# Patient Record
Sex: Female | Born: 1935 | Race: White | Hispanic: No | Smoking: Former smoker
Health system: Southern US, Community
[De-identification: ages and names within clinical notes are randomized; demographics above are authoritative.]

## PROBLEM LIST (undated history)

## (undated) DIAGNOSIS — H409 Unspecified glaucoma: Secondary | ICD-10-CM

## (undated) DIAGNOSIS — D3500 Benign neoplasm of unspecified adrenal gland: Secondary | ICD-10-CM

## (undated) DIAGNOSIS — I495 Sick sinus syndrome: Secondary | ICD-10-CM

## (undated) DIAGNOSIS — R51 Headache: Secondary | ICD-10-CM

## (undated) DIAGNOSIS — E042 Nontoxic multinodular goiter: Secondary | ICD-10-CM

## (undated) DIAGNOSIS — C3490 Malignant neoplasm of unspecified part of unspecified bronchus or lung: Secondary | ICD-10-CM

## (undated) DIAGNOSIS — K219 Gastro-esophageal reflux disease without esophagitis: Secondary | ICD-10-CM

## (undated) DIAGNOSIS — M810 Age-related osteoporosis without current pathological fracture: Secondary | ICD-10-CM

## (undated) DIAGNOSIS — K5792 Diverticulitis of intestine, part unspecified, without perforation or abscess without bleeding: Secondary | ICD-10-CM

## (undated) DIAGNOSIS — Z79899 Other long term (current) drug therapy: Secondary | ICD-10-CM

## (undated) DIAGNOSIS — E785 Hyperlipidemia, unspecified: Secondary | ICD-10-CM

## (undated) DIAGNOSIS — I10 Essential (primary) hypertension: Secondary | ICD-10-CM

## (undated) DIAGNOSIS — M5430 Sciatica, unspecified side: Secondary | ICD-10-CM

## (undated) DIAGNOSIS — A319 Mycobacterial infection, unspecified: Secondary | ICD-10-CM

## (undated) DIAGNOSIS — F419 Anxiety disorder, unspecified: Secondary | ICD-10-CM

## (undated) DIAGNOSIS — G8929 Other chronic pain: Secondary | ICD-10-CM

## (undated) DIAGNOSIS — H04123 Dry eye syndrome of bilateral lacrimal glands: Secondary | ICD-10-CM

## (undated) DIAGNOSIS — E0590 Thyrotoxicosis, unspecified without thyrotoxic crisis or storm: Secondary | ICD-10-CM

## (undated) DIAGNOSIS — I4821 Permanent atrial fibrillation: Secondary | ICD-10-CM

## (undated) DIAGNOSIS — K5720 Diverticulitis of large intestine with perforation and abscess without bleeding: Secondary | ICD-10-CM

## (undated) DIAGNOSIS — Z8719 Personal history of other diseases of the digestive system: Secondary | ICD-10-CM

## (undated) DIAGNOSIS — Z87448 Personal history of other diseases of urinary system: Secondary | ICD-10-CM

## (undated) HISTORY — DX: Thyrotoxicosis, unspecified without thyrotoxic crisis or storm: E05.90

## (undated) HISTORY — DX: Other long term (current) drug therapy: Z79.899

## (undated) HISTORY — DX: Dry eye syndrome of bilateral lacrimal glands: H04.123

## (undated) HISTORY — DX: Mycobacterial infection, unspecified: A31.9

## (undated) HISTORY — DX: Benign neoplasm of unspecified adrenal gland: D35.00

## (undated) HISTORY — PX: CATARACT EXTRACTION, BILATERAL: SHX1313

## (undated) HISTORY — DX: Personal history of other diseases of urinary system: Z87.448

## (undated) HISTORY — DX: Other chronic pain: G89.29

## (undated) HISTORY — DX: Permanent atrial fibrillation: I48.21

## (undated) HISTORY — DX: Sick sinus syndrome: I49.5

## (undated) HISTORY — DX: Diverticulitis of intestine, part unspecified, without perforation or abscess without bleeding: K57.92

## (undated) HISTORY — DX: Hyperlipidemia, unspecified: E78.5

## (undated) HISTORY — DX: Gastro-esophageal reflux disease without esophagitis: K21.9

## (undated) HISTORY — DX: Personal history of other diseases of the digestive system: Z87.19

## (undated) HISTORY — DX: Essential (primary) hypertension: I10

## (undated) HISTORY — DX: Headache: R51

## (undated) HISTORY — DX: Nontoxic multinodular goiter: E04.2

## (undated) HISTORY — DX: Sciatica, unspecified side: M54.30

## (undated) HISTORY — DX: Age-related osteoporosis without current pathological fracture: M81.0

## (undated) HISTORY — DX: Unspecified glaucoma: H40.9

## (undated) HISTORY — DX: Malignant neoplasm of unspecified part of unspecified bronchus or lung: C34.90

## (undated) HISTORY — DX: Anxiety disorder, unspecified: F41.9

---

## 1980-09-16 HISTORY — PX: BUNIONECTOMY: SHX129

## 1986-09-16 HISTORY — PX: HERNIA REPAIR: SHX51

## 1986-11-15 HISTORY — PX: LAMINECTOMY: SHX219

## 2003-12-14 ENCOUNTER — Encounter: Admission: RE | Admit: 2003-12-14 | Discharge: 2003-12-14 | Payer: Self-pay | Admitting: Internal Medicine

## 2005-02-15 ENCOUNTER — Ambulatory Visit (HOSPITAL_COMMUNITY): Admission: RE | Admit: 2005-02-15 | Discharge: 2005-02-15 | Payer: Self-pay | Admitting: Thoracic Surgery

## 2005-05-08 ENCOUNTER — Encounter: Admission: RE | Admit: 2005-05-08 | Discharge: 2005-05-08 | Payer: Self-pay | Admitting: Thoracic Surgery

## 2005-09-16 DIAGNOSIS — C3490 Malignant neoplasm of unspecified part of unspecified bronchus or lung: Secondary | ICD-10-CM

## 2005-09-16 HISTORY — DX: Malignant neoplasm of unspecified part of unspecified bronchus or lung: C34.90

## 2005-11-12 ENCOUNTER — Encounter: Admission: RE | Admit: 2005-11-12 | Discharge: 2005-11-12 | Payer: Self-pay | Admitting: Thoracic Surgery

## 2006-03-05 ENCOUNTER — Encounter: Admission: RE | Admit: 2006-03-05 | Discharge: 2006-03-05 | Payer: Self-pay | Admitting: Thoracic Surgery

## 2006-03-11 ENCOUNTER — Ambulatory Visit (HOSPITAL_COMMUNITY): Admission: RE | Admit: 2006-03-11 | Discharge: 2006-03-11 | Payer: Self-pay | Admitting: Thoracic Surgery

## 2006-03-16 HISTORY — PX: WEDGE RESECTION: SHX5070

## 2006-03-17 ENCOUNTER — Ambulatory Visit (HOSPITAL_COMMUNITY): Admission: RE | Admit: 2006-03-17 | Discharge: 2006-03-17 | Payer: Self-pay | Admitting: Thoracic Surgery

## 2006-03-17 ENCOUNTER — Encounter (INDEPENDENT_AMBULATORY_CARE_PROVIDER_SITE_OTHER): Payer: Self-pay | Admitting: Specialist

## 2006-03-20 ENCOUNTER — Ambulatory Visit: Admission: RE | Admit: 2006-03-20 | Discharge: 2006-06-08 | Payer: Self-pay | Admitting: Radiation Oncology

## 2006-04-01 ENCOUNTER — Encounter: Admission: RE | Admit: 2006-04-01 | Discharge: 2006-04-01 | Payer: Self-pay | Admitting: Radiation Oncology

## 2006-04-08 ENCOUNTER — Encounter (INDEPENDENT_AMBULATORY_CARE_PROVIDER_SITE_OTHER): Payer: Self-pay | Admitting: *Deleted

## 2006-04-08 ENCOUNTER — Inpatient Hospital Stay (HOSPITAL_COMMUNITY): Admission: RE | Admit: 2006-04-08 | Discharge: 2006-04-14 | Payer: Self-pay | Admitting: Thoracic Surgery

## 2006-04-16 ENCOUNTER — Encounter: Admission: RE | Admit: 2006-04-16 | Discharge: 2006-04-16 | Payer: Self-pay | Admitting: Thoracic Surgery

## 2006-04-30 ENCOUNTER — Encounter: Admission: RE | Admit: 2006-04-30 | Discharge: 2006-04-30 | Payer: Self-pay | Admitting: Thoracic Surgery

## 2006-05-07 ENCOUNTER — Encounter (HOSPITAL_COMMUNITY): Admission: RE | Admit: 2006-05-07 | Discharge: 2006-05-28 | Payer: Self-pay | Admitting: Internal Medicine

## 2006-05-14 ENCOUNTER — Encounter: Admission: RE | Admit: 2006-05-14 | Discharge: 2006-05-14 | Payer: Self-pay | Admitting: Thoracic Surgery

## 2006-05-16 ENCOUNTER — Ambulatory Visit: Payer: Self-pay | Admitting: Internal Medicine

## 2006-05-20 LAB — COMPREHENSIVE METABOLIC PANEL
Alkaline Phosphatase: 51 U/L (ref 39–117)
BUN: 19 mg/dL (ref 6–23)
Creatinine, Ser: 0.72 mg/dL (ref 0.40–1.20)
Glucose, Bld: 85 mg/dL (ref 70–99)
Total Bilirubin: 0.4 mg/dL (ref 0.3–1.2)

## 2006-05-20 LAB — CBC WITH DIFFERENTIAL/PLATELET
Basophils Absolute: 0 10*3/uL (ref 0.0–0.1)
EOS%: 3.2 % (ref 0.0–7.0)
HGB: 12.9 g/dL (ref 11.6–15.9)
MCH: 30.4 pg (ref 26.0–34.0)
MCV: 89.4 fL (ref 81.0–101.0)
MONO%: 10.8 % (ref 0.0–13.0)
NEUT#: 4.4 10*3/uL (ref 1.5–6.5)
RBC: 4.23 10*6/uL (ref 3.70–5.32)
RDW: 13.6 % (ref 11.3–14.5)
lymph#: 1 10*3/uL (ref 0.9–3.3)

## 2006-06-03 ENCOUNTER — Encounter: Admission: RE | Admit: 2006-06-03 | Discharge: 2006-06-03 | Payer: Self-pay | Admitting: Thoracic Surgery

## 2006-06-04 ENCOUNTER — Other Ambulatory Visit: Admission: RE | Admit: 2006-06-04 | Discharge: 2006-06-04 | Payer: Self-pay | Admitting: Diagnostic Radiology

## 2006-06-04 ENCOUNTER — Encounter (INDEPENDENT_AMBULATORY_CARE_PROVIDER_SITE_OTHER): Payer: Self-pay | Admitting: *Deleted

## 2006-06-04 ENCOUNTER — Encounter: Admission: RE | Admit: 2006-06-04 | Discharge: 2006-06-04 | Payer: Self-pay | Admitting: Internal Medicine

## 2006-08-27 ENCOUNTER — Encounter: Admission: RE | Admit: 2006-08-27 | Discharge: 2006-08-27 | Payer: Self-pay | Admitting: Thoracic Surgery

## 2006-09-16 DIAGNOSIS — I495 Sick sinus syndrome: Secondary | ICD-10-CM

## 2006-09-16 HISTORY — DX: Sick sinus syndrome: I49.5

## 2006-11-06 ENCOUNTER — Ambulatory Visit: Payer: Self-pay | Admitting: Internal Medicine

## 2006-11-11 LAB — CBC WITH DIFFERENTIAL/PLATELET
Basophils Absolute: 0 10*3/uL (ref 0.0–0.1)
Eosinophils Absolute: 0.1 10*3/uL (ref 0.0–0.5)
HGB: 14.3 g/dL (ref 11.6–15.9)
LYMPH%: 17.2 % (ref 14.0–48.0)
MCV: 85.5 fL (ref 81.0–101.0)
MONO#: 0.6 10*3/uL (ref 0.1–0.9)
MONO%: 8.7 % (ref 0.0–13.0)
NEUT#: 5.3 10*3/uL (ref 1.5–6.5)
Platelets: 441 10*3/uL — ABNORMAL HIGH (ref 145–400)
WBC: 7.3 10*3/uL (ref 3.9–10.0)

## 2006-11-11 LAB — COMPREHENSIVE METABOLIC PANEL
Albumin: 4.2 g/dL (ref 3.5–5.2)
Alkaline Phosphatase: 55 U/L (ref 39–117)
BUN: 12 mg/dL (ref 6–23)
CO2: 28 mEq/L (ref 19–32)
Glucose, Bld: 105 mg/dL — ABNORMAL HIGH (ref 70–99)
Potassium: 3.7 mEq/L (ref 3.5–5.3)
Total Bilirubin: 0.5 mg/dL (ref 0.3–1.2)

## 2006-11-13 ENCOUNTER — Ambulatory Visit (HOSPITAL_COMMUNITY): Admission: RE | Admit: 2006-11-13 | Discharge: 2006-11-13 | Payer: Self-pay | Admitting: Internal Medicine

## 2006-12-24 ENCOUNTER — Encounter: Admission: RE | Admit: 2006-12-24 | Discharge: 2006-12-24 | Payer: Self-pay | Admitting: Thoracic Surgery

## 2006-12-24 ENCOUNTER — Ambulatory Visit: Payer: Self-pay | Admitting: Thoracic Surgery

## 2007-01-15 HISTORY — PX: PACEMAKER INSERTION: SHX728

## 2007-01-17 ENCOUNTER — Inpatient Hospital Stay (HOSPITAL_COMMUNITY): Admission: AD | Admit: 2007-01-17 | Discharge: 2007-01-21 | Payer: Self-pay | Admitting: Cardiology

## 2007-01-17 ENCOUNTER — Encounter: Payer: Self-pay | Admitting: Cardiology

## 2007-01-18 ENCOUNTER — Encounter (INDEPENDENT_AMBULATORY_CARE_PROVIDER_SITE_OTHER): Payer: Self-pay | Admitting: Cardiology

## 2007-04-21 ENCOUNTER — Ambulatory Visit: Payer: Self-pay | Admitting: Internal Medicine

## 2007-04-21 LAB — COMPREHENSIVE METABOLIC PANEL
Albumin: 3.8 g/dL (ref 3.5–5.2)
BUN: 13 mg/dL (ref 6–23)
CO2: 29 mEq/L (ref 19–32)
Calcium: 9.2 mg/dL (ref 8.4–10.5)
Chloride: 97 mEq/L (ref 96–112)
Glucose, Bld: 100 mg/dL — ABNORMAL HIGH (ref 70–99)
Potassium: 3.4 mEq/L — ABNORMAL LOW (ref 3.5–5.3)
Sodium: 138 mEq/L (ref 135–145)
Total Protein: 7 g/dL (ref 6.0–8.3)

## 2007-04-21 LAB — CBC WITH DIFFERENTIAL/PLATELET
Basophils Absolute: 0 10*3/uL (ref 0.0–0.1)
Eosinophils Absolute: 0.1 10*3/uL (ref 0.0–0.5)
HGB: 13.1 g/dL (ref 11.6–15.9)
MCV: 84.5 fL (ref 81.0–101.0)
MONO#: 0.9 10*3/uL (ref 0.1–0.9)
MONO%: 10.8 % (ref 0.0–13.0)
NEUT#: 6 10*3/uL (ref 1.5–6.5)
RBC: 4.5 10*6/uL (ref 3.70–5.32)
RDW: 14.8 % — ABNORMAL HIGH (ref 11.3–14.5)
WBC: 8.1 10*3/uL (ref 3.9–10.0)

## 2007-04-22 ENCOUNTER — Ambulatory Visit (HOSPITAL_COMMUNITY): Admission: RE | Admit: 2007-04-22 | Discharge: 2007-04-22 | Payer: Self-pay | Admitting: Internal Medicine

## 2007-04-29 ENCOUNTER — Encounter: Payer: Self-pay | Admitting: Pulmonary Disease

## 2007-05-06 ENCOUNTER — Ambulatory Visit: Payer: Self-pay | Admitting: Pulmonary Disease

## 2007-05-13 ENCOUNTER — Ambulatory Visit: Payer: Self-pay | Admitting: Pulmonary Disease

## 2007-05-13 ENCOUNTER — Ambulatory Visit: Admission: RE | Admit: 2007-05-13 | Discharge: 2007-05-13 | Payer: Self-pay | Admitting: Pulmonary Disease

## 2007-05-14 ENCOUNTER — Encounter: Payer: Self-pay | Admitting: Pulmonary Disease

## 2007-05-20 ENCOUNTER — Ambulatory Visit: Payer: Self-pay | Admitting: Pulmonary Disease

## 2007-05-21 DIAGNOSIS — I4891 Unspecified atrial fibrillation: Secondary | ICD-10-CM

## 2007-05-21 DIAGNOSIS — J479 Bronchiectasis, uncomplicated: Secondary | ICD-10-CM

## 2007-05-21 DIAGNOSIS — C3490 Malignant neoplasm of unspecified part of unspecified bronchus or lung: Secondary | ICD-10-CM | POA: Insufficient documentation

## 2007-06-10 ENCOUNTER — Ambulatory Visit: Payer: Self-pay | Admitting: Pulmonary Disease

## 2007-06-18 ENCOUNTER — Ambulatory Visit: Payer: Self-pay | Admitting: Internal Medicine

## 2007-06-23 ENCOUNTER — Ambulatory Visit (HOSPITAL_COMMUNITY): Admission: RE | Admit: 2007-06-23 | Discharge: 2007-06-23 | Payer: Self-pay | Admitting: Internal Medicine

## 2007-06-23 LAB — COMPREHENSIVE METABOLIC PANEL
ALT: 67 U/L — ABNORMAL HIGH (ref 0–35)
Alkaline Phosphatase: 57 U/L (ref 39–117)
CO2: 33 mEq/L — ABNORMAL HIGH (ref 19–32)
Creatinine, Ser: 0.58 mg/dL (ref 0.40–1.20)
Sodium: 137 mEq/L (ref 135–145)
Total Bilirubin: 0.3 mg/dL (ref 0.3–1.2)
Total Protein: 6.9 g/dL (ref 6.0–8.3)

## 2007-06-23 LAB — CBC WITH DIFFERENTIAL/PLATELET
BASO%: 0.6 % (ref 0.0–2.0)
EOS%: 2 % (ref 0.0–7.0)
HCT: 36.2 % (ref 34.8–46.6)
LYMPH%: 17.6 % (ref 14.0–48.0)
MCH: 28.3 pg (ref 26.0–34.0)
MCHC: 34.5 g/dL (ref 32.0–36.0)
MCV: 81.8 fL (ref 81.0–101.0)
MONO%: 17.4 % — ABNORMAL HIGH (ref 0.0–13.0)
NEUT%: 62.4 % (ref 39.6–76.8)
Platelets: 449 10*3/uL — ABNORMAL HIGH (ref 145–400)
RBC: 4.43 10*6/uL (ref 3.70–5.32)
WBC: 5.9 10*3/uL (ref 3.9–10.0)

## 2007-06-24 ENCOUNTER — Ambulatory Visit: Payer: Self-pay | Admitting: Thoracic Surgery

## 2007-07-08 ENCOUNTER — Ambulatory Visit: Payer: Self-pay | Admitting: Pulmonary Disease

## 2007-07-08 LAB — CONVERTED CEMR LAB
ALT: 100 units/L — ABNORMAL HIGH (ref 0–35)
Alkaline Phosphatase: 55 units/L (ref 39–117)
Bilirubin, Direct: 0.1 mg/dL (ref 0.0–0.3)
Total Bilirubin: 0.5 mg/dL (ref 0.3–1.2)
Total Protein: 7.4 g/dL (ref 6.0–8.3)

## 2007-08-03 ENCOUNTER — Ambulatory Visit: Payer: Self-pay | Admitting: Pulmonary Disease

## 2007-08-03 LAB — CONVERTED CEMR LAB
Bilirubin, Direct: 0.1 mg/dL (ref 0.0–0.3)
Total Protein: 7.5 g/dL (ref 6.0–8.3)

## 2007-08-24 ENCOUNTER — Encounter: Admission: RE | Admit: 2007-08-24 | Discharge: 2007-08-24 | Payer: Self-pay | Admitting: Internal Medicine

## 2007-09-08 ENCOUNTER — Ambulatory Visit: Admission: RE | Admit: 2007-09-08 | Discharge: 2007-09-14 | Payer: Self-pay | Admitting: Radiation Oncology

## 2007-09-18 ENCOUNTER — Ambulatory Visit: Payer: Self-pay | Admitting: Pulmonary Disease

## 2007-09-20 DIAGNOSIS — A310 Pulmonary mycobacterial infection: Secondary | ICD-10-CM | POA: Insufficient documentation

## 2007-12-17 ENCOUNTER — Ambulatory Visit: Payer: Self-pay | Admitting: Internal Medicine

## 2007-12-22 ENCOUNTER — Ambulatory Visit (HOSPITAL_COMMUNITY): Admission: RE | Admit: 2007-12-22 | Discharge: 2007-12-22 | Payer: Self-pay | Admitting: Internal Medicine

## 2007-12-22 LAB — CBC WITH DIFFERENTIAL/PLATELET
Basophils Absolute: 0.1 10*3/uL (ref 0.0–0.1)
Eosinophils Absolute: 0.1 10*3/uL (ref 0.0–0.5)
HGB: 13.2 g/dL (ref 11.6–15.9)
MONO#: 0.5 10*3/uL (ref 0.1–0.9)
NEUT#: 3.5 10*3/uL (ref 1.5–6.5)
RBC: 4.77 10*6/uL (ref 3.70–5.32)
RDW: 14.3 % (ref 11.3–14.5)
WBC: 5.3 10*3/uL (ref 3.9–10.0)
lymph#: 1.1 10*3/uL (ref 0.9–3.3)

## 2007-12-22 LAB — COMPREHENSIVE METABOLIC PANEL
AST: 27 U/L (ref 0–37)
Albumin: 4 g/dL (ref 3.5–5.2)
Alkaline Phosphatase: 44 U/L (ref 39–117)
BUN: 8 mg/dL (ref 6–23)
Calcium: 9 mg/dL (ref 8.4–10.5)
Chloride: 97 mEq/L (ref 96–112)
Glucose, Bld: 117 mg/dL — ABNORMAL HIGH (ref 70–99)
Potassium: 3.1 mEq/L — ABNORMAL LOW (ref 3.5–5.3)
Sodium: 133 mEq/L — ABNORMAL LOW (ref 135–145)
Total Protein: 7.7 g/dL (ref 6.0–8.3)

## 2007-12-23 ENCOUNTER — Ambulatory Visit: Payer: Self-pay | Admitting: Pulmonary Disease

## 2007-12-23 ENCOUNTER — Ambulatory Visit: Payer: Self-pay | Admitting: Thoracic Surgery

## 2007-12-23 DIAGNOSIS — R079 Chest pain, unspecified: Secondary | ICD-10-CM | POA: Insufficient documentation

## 2008-01-23 IMAGING — CT CT BIOPSY
1 series · 15 of 33 positions shown, 19 images · non-contrast
Comparison: Prior CT and PET studies.

CLINICAL DATA: Enlarging right lower lobe lung nodule.  The patient presents for CT guided biopsy procedure.
 CT GUIDED NEEDLE ASPIRATE BIOPSY OF RIGHT LOWER LOBE LUNG MASS:

[Series 2: routine chest · axial · 0.70mm/px · z∈[-319,-194]mm · 15 of 129 slices shown, 19 images]
[im 10/129  mediastinal]
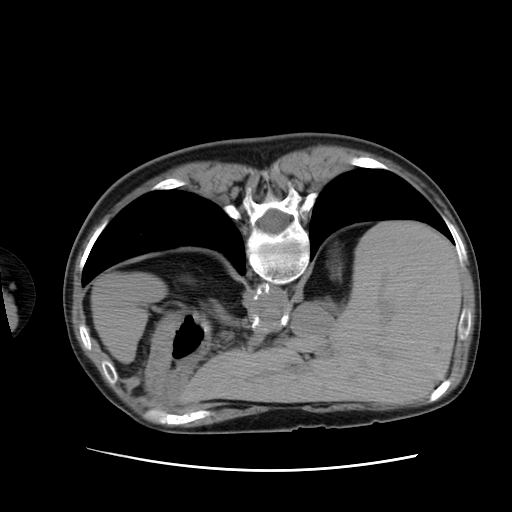
[im 10/129  lung]
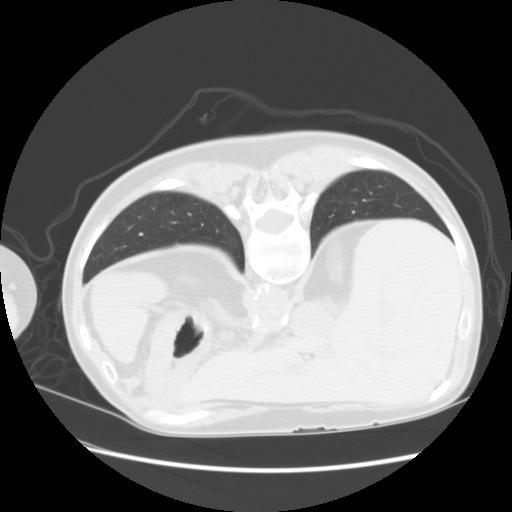
[im 19/129  lung]
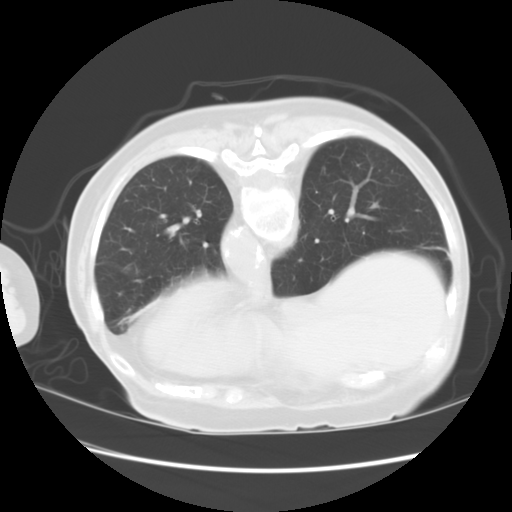
[im 24/129  lung]
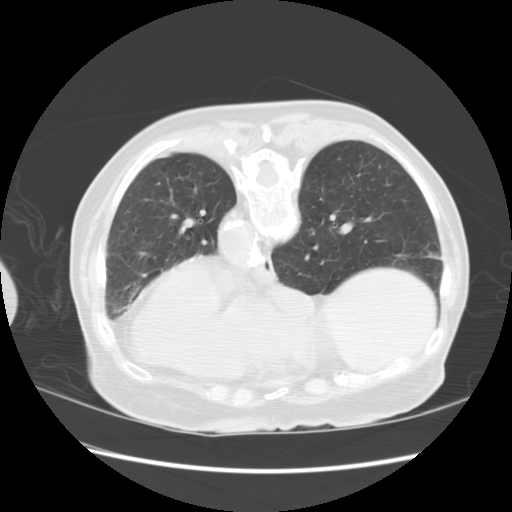
[im 29/129  lung]
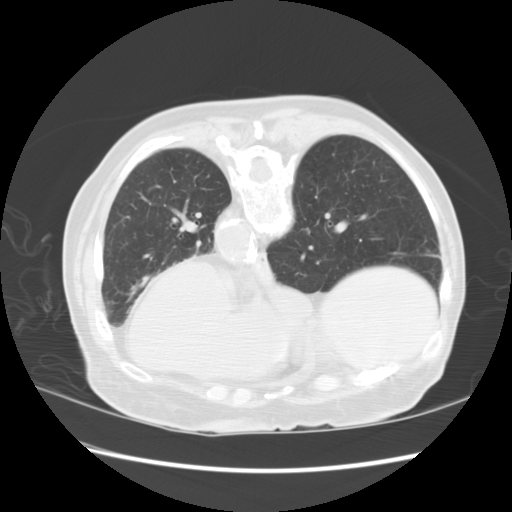
[im 38/129  mediastinal]
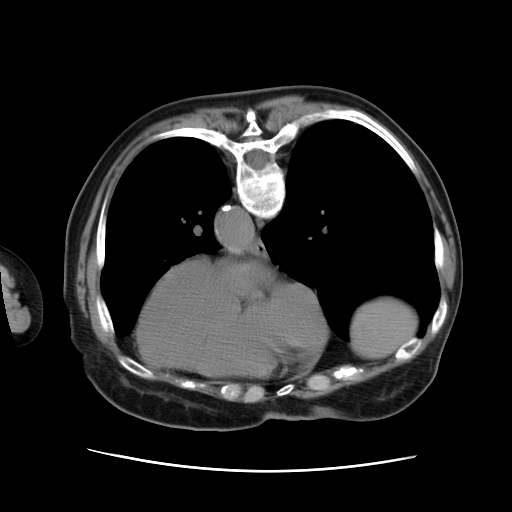
[im 38/129  lung]
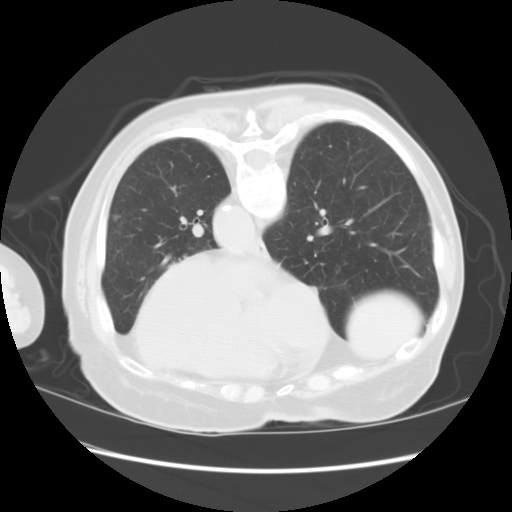
[im 48/129  lung]
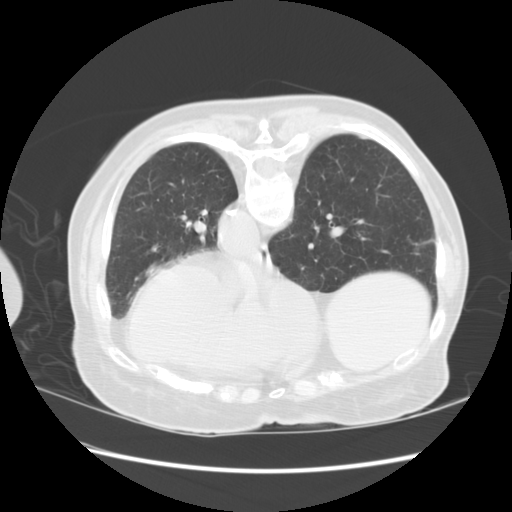
[im 53/129  lung]
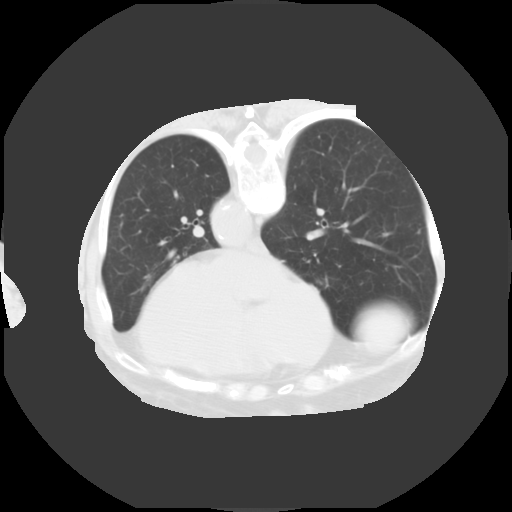
[im 62/129  lung]
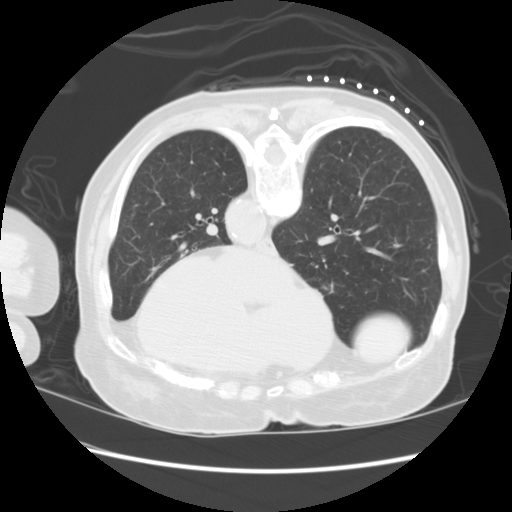
[im 72/129  mediastinal]
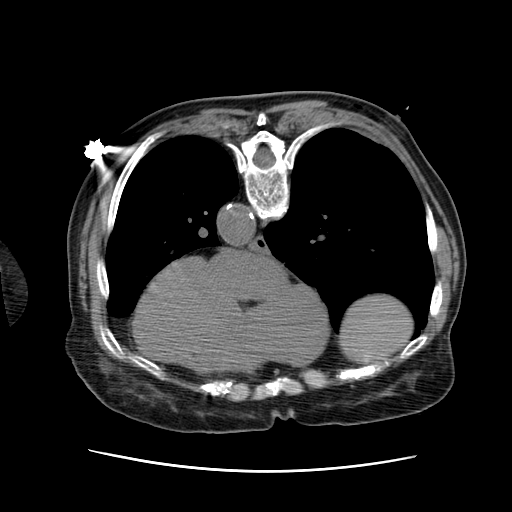
[im 72/129  lung]
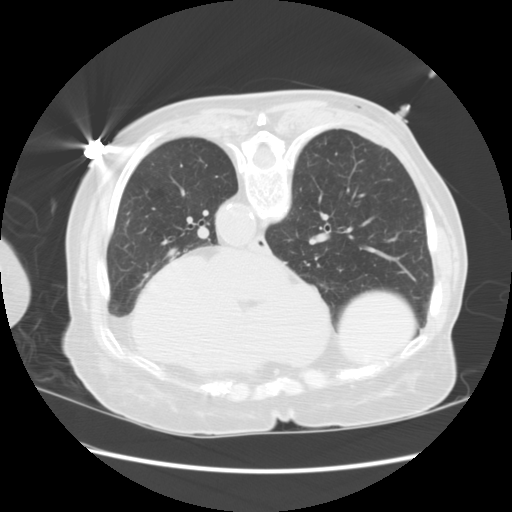
[im 77/129  lung]
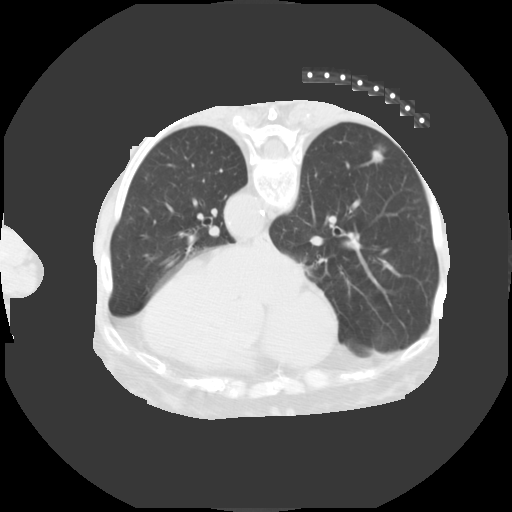
[im 86/129  lung]
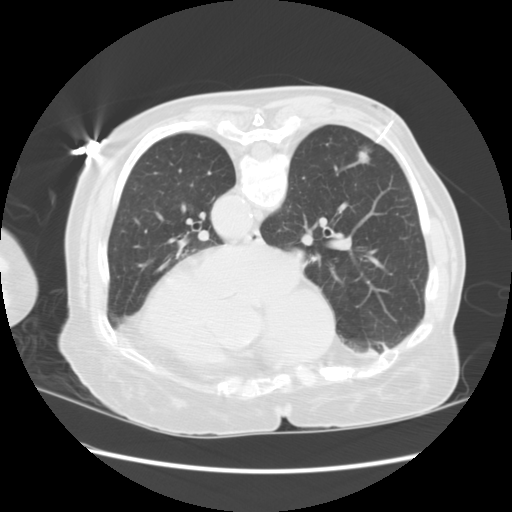
[im 95/129  lung]
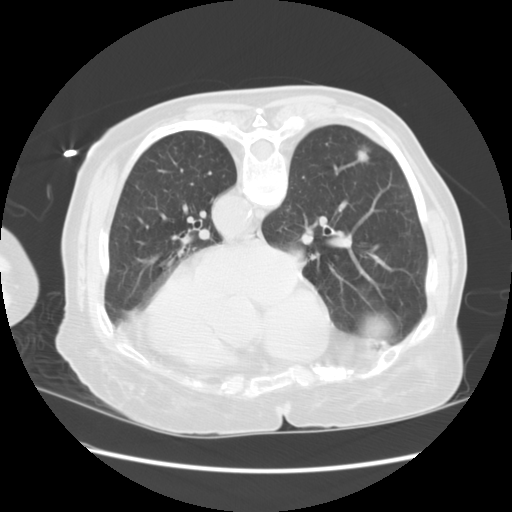
[im 103/129  mediastinal]
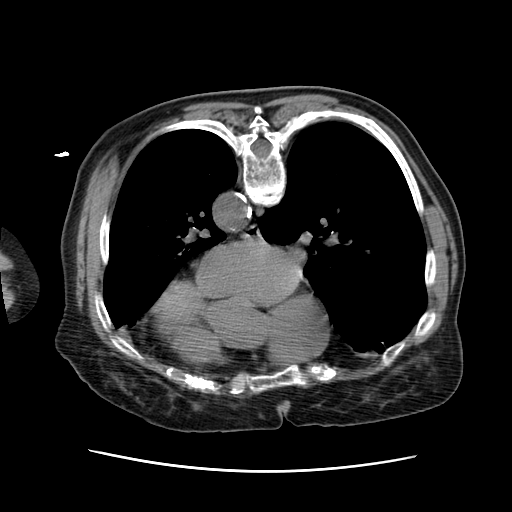
[im 103/129  lung]
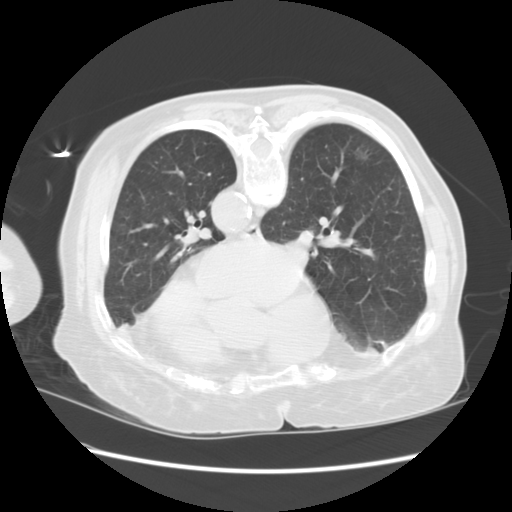
[im 110/129  lung]
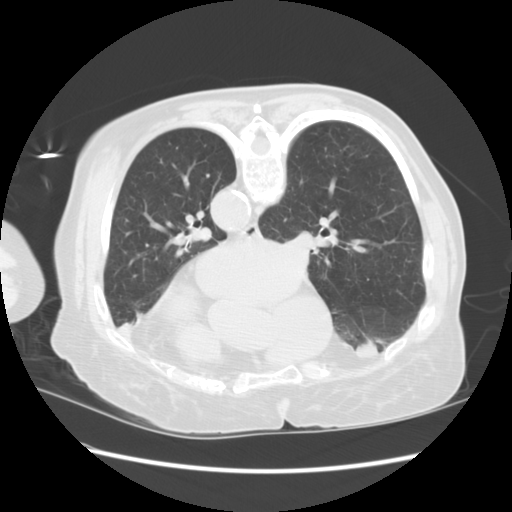
[im 119/129  lung]
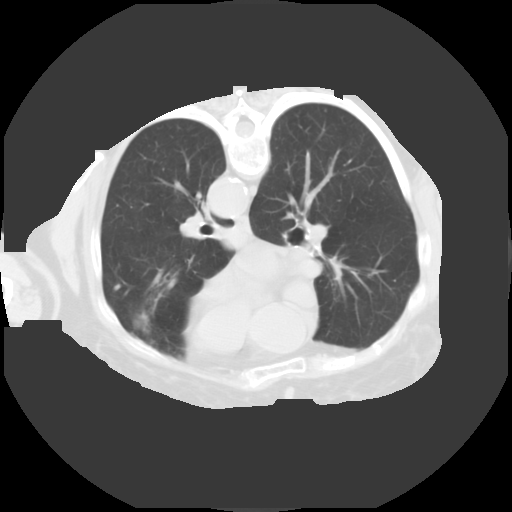

[15 of 33 positions shown; findings below may reference images not displayed]

Prior to the procedure, informed consent was obtained.  
 Sedation:  1.5 mg IV Versed, 75 mCg IV fentanyl.
 Total moderate sedation time:  30 minutes.
FINDINGS: The patient was placed in a prone position and unenhanced CT performed through the lower lung zones.  After localizing a site along the posterior right chest wall, the skin was sterilely prepped and draped.  Local anesthesia was provided with 1% Lidocaine.  
 Under CT guidance, a 19-gauge trocar needle was advanced to the level of a lower lobe pulmonary nodule.  After confirming needle tip position, coaxial needle aspirates were performed with 22-gauge needles.  Initially, 3 needle aspirates were performed with 2 placed on slides for cytologic analysis including a Quick stain and a third placed in solution for cell block purposes.
 After Quick stain analysis, a fourth needle aspirate was then performed for cell block.   After the procedure, the outer needle was removed and additional CT performed.
FINDINGS: Imaging again reveals a solitary pulmonary nodule in the posterior right lower lobe, measuring approximately 10 mm in greatest diameter.  This was successfully sampled with a Quick stain revealing atypical cells and probable low-grade malignancy.  Additional request was made, however, by pathology to obtain as much material as possible for adequate cell block, and therefore, an additional aspirate was performed for cell block purposes.  Post procedural CT after the procedure does demonstrate regional hemorrhage around the nodule and a small amount of extrapleural air without pneumothorax.  The patient will recover for 3 hours.
IMPRESSION: CT guided needle aspirate biopsy of right lower lobe nodule.  Preliminary Quick stain analysis does reveal atypia and probable low-grade malignancy.  Final cytology is pending.

## 2008-03-17 ENCOUNTER — Ambulatory Visit: Payer: Self-pay | Admitting: Internal Medicine

## 2008-03-22 ENCOUNTER — Ambulatory Visit (HOSPITAL_COMMUNITY): Admission: RE | Admit: 2008-03-22 | Discharge: 2008-03-22 | Payer: Self-pay | Admitting: Internal Medicine

## 2008-03-22 LAB — COMPREHENSIVE METABOLIC PANEL
Albumin: 3.5 g/dL (ref 3.5–5.2)
Alkaline Phosphatase: 58 U/L (ref 39–117)
BUN: 14 mg/dL (ref 6–23)
CO2: 32 mEq/L (ref 19–32)
Calcium: 9.5 mg/dL (ref 8.4–10.5)
Chloride: 98 mEq/L (ref 96–112)
Glucose, Bld: 83 mg/dL (ref 70–99)
Potassium: 3 mEq/L — ABNORMAL LOW (ref 3.5–5.3)
Total Protein: 7.4 g/dL (ref 6.0–8.3)

## 2008-03-22 LAB — CBC WITH DIFFERENTIAL/PLATELET
Basophils Absolute: 0 10*3/uL (ref 0.0–0.1)
Eosinophils Absolute: 0.1 10*3/uL (ref 0.0–0.5)
HGB: 13.3 g/dL (ref 11.6–15.9)
MCV: 82.8 fL (ref 81.0–101.0)
MONO#: 1 10*3/uL — ABNORMAL HIGH (ref 0.1–0.9)
MONO%: 13.5 % — ABNORMAL HIGH (ref 0.0–13.0)
NEUT#: 5 10*3/uL (ref 1.5–6.5)
RDW: 15.3 % — ABNORMAL HIGH (ref 11.3–14.5)
WBC: 7.5 10*3/uL (ref 3.9–10.0)

## 2008-03-30 ENCOUNTER — Ambulatory Visit: Payer: Self-pay | Admitting: Thoracic Surgery

## 2008-04-22 ENCOUNTER — Ambulatory Visit: Payer: Self-pay | Admitting: Pulmonary Disease

## 2008-06-17 ENCOUNTER — Ambulatory Visit: Payer: Self-pay | Admitting: Internal Medicine

## 2008-06-21 ENCOUNTER — Ambulatory Visit (HOSPITAL_COMMUNITY): Admission: RE | Admit: 2008-06-21 | Discharge: 2008-06-21 | Payer: Self-pay | Admitting: Internal Medicine

## 2008-06-21 LAB — CBC WITH DIFFERENTIAL/PLATELET
BASO%: 0.3 % (ref 0.0–2.0)
Eosinophils Absolute: 0.2 10*3/uL (ref 0.0–0.5)
LYMPH%: 15.5 % (ref 14.0–48.0)
MCHC: 33 g/dL (ref 32.0–36.0)
MONO#: 0.6 10*3/uL (ref 0.1–0.9)
NEUT#: 4.6 10*3/uL (ref 1.5–6.5)
Platelets: 362 10*3/uL (ref 145–400)
RBC: 4.73 10*6/uL (ref 3.70–5.32)
RDW: 14.8 % — ABNORMAL HIGH (ref 11.3–14.5)
WBC: 6.4 10*3/uL (ref 3.9–10.0)
lymph#: 1 10*3/uL (ref 0.9–3.3)

## 2008-06-21 LAB — COMPREHENSIVE METABOLIC PANEL
Albumin: 3.8 g/dL (ref 3.5–5.2)
Alkaline Phosphatase: 65 U/L (ref 39–117)
BUN: 11 mg/dL (ref 6–23)
CO2: 31 mEq/L (ref 19–32)
Chloride: 92 mEq/L — ABNORMAL LOW (ref 96–112)
Sodium: 133 mEq/L — ABNORMAL LOW (ref 135–145)
Total Bilirubin: 0.6 mg/dL (ref 0.3–1.2)

## 2008-06-24 ENCOUNTER — Encounter: Payer: Self-pay | Admitting: Pulmonary Disease

## 2008-06-24 ENCOUNTER — Ambulatory Visit: Payer: Self-pay | Admitting: Pulmonary Disease

## 2008-06-24 DIAGNOSIS — R05 Cough: Secondary | ICD-10-CM | POA: Insufficient documentation

## 2008-06-29 ENCOUNTER — Ambulatory Visit: Payer: Self-pay | Admitting: Thoracic Surgery

## 2008-07-18 ENCOUNTER — Telehealth (INDEPENDENT_AMBULATORY_CARE_PROVIDER_SITE_OTHER): Payer: Self-pay | Admitting: *Deleted

## 2008-07-25 ENCOUNTER — Ambulatory Visit: Payer: Self-pay | Admitting: Pulmonary Disease

## 2008-09-21 ENCOUNTER — Ambulatory Visit: Payer: Self-pay | Admitting: Internal Medicine

## 2008-09-23 ENCOUNTER — Ambulatory Visit (HOSPITAL_COMMUNITY): Admission: RE | Admit: 2008-09-23 | Discharge: 2008-09-23 | Payer: Self-pay | Admitting: Internal Medicine

## 2008-09-23 LAB — CBC WITH DIFFERENTIAL/PLATELET
BASO%: 0.4 % (ref 0.0–2.0)
Eosinophils Absolute: 0.2 10*3/uL (ref 0.0–0.5)
MCHC: 33 g/dL (ref 32.0–36.0)
MONO#: 0.9 10*3/uL (ref 0.1–0.9)
MONO%: 18.1 % — ABNORMAL HIGH (ref 0.0–13.0)
NEUT#: 3 10*3/uL (ref 1.5–6.5)
RBC: 4.69 10*6/uL (ref 3.70–5.32)
RDW: 15.3 % — ABNORMAL HIGH (ref 11.3–14.5)
WBC: 5.2 10*3/uL (ref 3.9–10.0)

## 2008-09-23 LAB — COMPREHENSIVE METABOLIC PANEL
ALT: 21 U/L (ref 0–35)
Albumin: 3.7 g/dL (ref 3.5–5.2)
Alkaline Phosphatase: 45 U/L (ref 39–117)
CO2: 35 mEq/L — ABNORMAL HIGH (ref 19–32)
Glucose, Bld: 78 mg/dL (ref 70–99)
Potassium: 3.2 mEq/L — ABNORMAL LOW (ref 3.5–5.3)
Sodium: 139 mEq/L (ref 135–145)
Total Protein: 7.3 g/dL (ref 6.0–8.3)

## 2008-09-28 LAB — BASIC METABOLIC PANEL
BUN: 14 mg/dL (ref 6–23)
Calcium: 9.1 mg/dL (ref 8.4–10.5)
Glucose, Bld: 154 mg/dL — ABNORMAL HIGH (ref 70–99)
Potassium: 3.1 mEq/L — ABNORMAL LOW (ref 3.5–5.3)

## 2008-10-13 ENCOUNTER — Ambulatory Visit: Payer: Self-pay | Admitting: Pulmonary Disease

## 2008-10-13 ENCOUNTER — Telehealth (INDEPENDENT_AMBULATORY_CARE_PROVIDER_SITE_OTHER): Payer: Self-pay | Admitting: *Deleted

## 2008-10-13 DIAGNOSIS — J471 Bronchiectasis with (acute) exacerbation: Secondary | ICD-10-CM | POA: Insufficient documentation

## 2008-11-16 ENCOUNTER — Ambulatory Visit: Payer: Self-pay | Admitting: Pulmonary Disease

## 2008-11-16 DIAGNOSIS — R0602 Shortness of breath: Secondary | ICD-10-CM

## 2008-11-23 ENCOUNTER — Ambulatory Visit: Payer: Self-pay | Admitting: Pulmonary Disease

## 2008-12-06 ENCOUNTER — Ambulatory Visit: Payer: Self-pay | Admitting: Pulmonary Disease

## 2008-12-06 DIAGNOSIS — J449 Chronic obstructive pulmonary disease, unspecified: Secondary | ICD-10-CM | POA: Insufficient documentation

## 2008-12-06 DIAGNOSIS — J4489 Other specified chronic obstructive pulmonary disease: Secondary | ICD-10-CM | POA: Insufficient documentation

## 2008-12-15 ENCOUNTER — Ambulatory Visit: Payer: Self-pay | Admitting: Internal Medicine

## 2008-12-20 ENCOUNTER — Ambulatory Visit (HOSPITAL_COMMUNITY): Admission: RE | Admit: 2008-12-20 | Discharge: 2008-12-20 | Payer: Self-pay | Admitting: Internal Medicine

## 2008-12-20 LAB — COMPREHENSIVE METABOLIC PANEL
Albumin: 3.5 g/dL (ref 3.5–5.2)
CO2: 33 mEq/L — ABNORMAL HIGH (ref 19–32)
Chloride: 100 mEq/L (ref 96–112)
Glucose, Bld: 77 mg/dL (ref 70–99)
Potassium: 4 mEq/L (ref 3.5–5.3)
Sodium: 139 mEq/L (ref 135–145)
Total Protein: 7.1 g/dL (ref 6.0–8.3)

## 2008-12-20 LAB — CBC WITH DIFFERENTIAL/PLATELET
Eosinophils Absolute: 0.2 10*3/uL (ref 0.0–0.5)
LYMPH%: 20.6 % (ref 14.0–49.7)
MONO#: 0.8 10*3/uL (ref 0.1–0.9)
NEUT#: 3.3 10*3/uL (ref 1.5–6.5)
Platelets: 308 10*3/uL (ref 145–400)
RBC: 4.22 10*6/uL (ref 3.70–5.45)
RDW: 15.5 % — ABNORMAL HIGH (ref 11.2–14.5)
WBC: 5.4 10*3/uL (ref 3.9–10.3)
lymph#: 1.1 10*3/uL (ref 0.9–3.3)

## 2009-01-02 ENCOUNTER — Ambulatory Visit (HOSPITAL_COMMUNITY): Admission: RE | Admit: 2009-01-02 | Discharge: 2009-01-02 | Payer: Self-pay | Admitting: Internal Medicine

## 2009-01-12 ENCOUNTER — Encounter (INDEPENDENT_AMBULATORY_CARE_PROVIDER_SITE_OTHER): Payer: Self-pay | Admitting: Diagnostic Radiology

## 2009-01-12 ENCOUNTER — Ambulatory Visit (HOSPITAL_COMMUNITY): Admission: RE | Admit: 2009-01-12 | Discharge: 2009-01-12 | Payer: Self-pay | Admitting: Internal Medicine

## 2009-01-18 LAB — CBC WITH DIFFERENTIAL/PLATELET
Eosinophils Absolute: 0.5 10*3/uL (ref 0.0–0.5)
HCT: 36.6 % (ref 34.8–46.6)
HGB: 12.1 g/dL (ref 11.6–15.9)
LYMPH%: 19 % (ref 14.0–49.7)
MONO#: 0.8 10*3/uL (ref 0.1–0.9)
NEUT#: 3.8 10*3/uL (ref 1.5–6.5)
NEUT%: 59.7 % (ref 38.4–76.8)
Platelets: 334 10*3/uL (ref 145–400)
RBC: 4.43 10*6/uL (ref 3.70–5.45)
WBC: 6.4 10*3/uL (ref 3.9–10.3)

## 2009-01-18 LAB — COMPREHENSIVE METABOLIC PANEL
CO2: 27 mEq/L (ref 19–32)
Glucose, Bld: 115 mg/dL — ABNORMAL HIGH (ref 70–99)
Sodium: 138 mEq/L (ref 135–145)
Total Bilirubin: 0.5 mg/dL (ref 0.3–1.2)
Total Protein: 7.2 g/dL (ref 6.0–8.3)

## 2009-03-16 ENCOUNTER — Ambulatory Visit: Payer: Self-pay | Admitting: Pulmonary Disease

## 2009-04-06 ENCOUNTER — Ambulatory Visit: Payer: Self-pay | Admitting: Internal Medicine

## 2009-04-06 ENCOUNTER — Telehealth: Payer: Self-pay | Admitting: Pulmonary Disease

## 2009-04-11 ENCOUNTER — Ambulatory Visit (HOSPITAL_COMMUNITY): Admission: RE | Admit: 2009-04-11 | Discharge: 2009-04-11 | Payer: Self-pay | Admitting: Internal Medicine

## 2009-04-11 LAB — CBC WITH DIFFERENTIAL/PLATELET
Eosinophils Absolute: 0.1 10*3/uL (ref 0.0–0.5)
HCT: 38.3 % (ref 34.8–46.6)
LYMPH%: 13.5 % — ABNORMAL LOW (ref 14.0–49.7)
MCV: 82.5 fL (ref 79.5–101.0)
MONO#: 0.9 10*3/uL (ref 0.1–0.9)
NEUT#: 6.1 10*3/uL (ref 1.5–6.5)
NEUT%: 74.5 % (ref 38.4–76.8)
Platelets: 344 10*3/uL (ref 145–400)
WBC: 8.1 10*3/uL (ref 3.9–10.3)

## 2009-04-11 LAB — COMPREHENSIVE METABOLIC PANEL
BUN: 10 mg/dL (ref 6–23)
CO2: 32 mEq/L (ref 19–32)
Creatinine, Ser: 0.67 mg/dL (ref 0.40–1.20)
Glucose, Bld: 101 mg/dL — ABNORMAL HIGH (ref 70–99)
Total Bilirubin: 0.6 mg/dL (ref 0.3–1.2)

## 2009-06-14 ENCOUNTER — Ambulatory Visit: Payer: Self-pay | Admitting: Pulmonary Disease

## 2009-10-12 ENCOUNTER — Ambulatory Visit: Payer: Self-pay | Admitting: Internal Medicine

## 2009-10-14 ENCOUNTER — Emergency Department (HOSPITAL_COMMUNITY): Admission: EM | Admit: 2009-10-14 | Discharge: 2009-10-14 | Payer: Self-pay | Admitting: Emergency Medicine

## 2009-10-19 ENCOUNTER — Ambulatory Visit: Payer: Self-pay | Admitting: Pulmonary Disease

## 2009-10-19 DIAGNOSIS — R042 Hemoptysis: Secondary | ICD-10-CM | POA: Insufficient documentation

## 2009-10-24 ENCOUNTER — Ambulatory Visit: Payer: Self-pay | Admitting: Pulmonary Disease

## 2009-10-24 ENCOUNTER — Ambulatory Visit: Admission: RE | Admit: 2009-10-24 | Discharge: 2009-10-24 | Payer: Self-pay | Admitting: Pulmonary Disease

## 2009-10-30 ENCOUNTER — Telehealth (INDEPENDENT_AMBULATORY_CARE_PROVIDER_SITE_OTHER): Payer: Self-pay | Admitting: *Deleted

## 2009-11-20 ENCOUNTER — Ambulatory Visit: Payer: Self-pay | Admitting: Pulmonary Disease

## 2010-04-06 ENCOUNTER — Ambulatory Visit: Payer: Self-pay | Admitting: Internal Medicine

## 2010-04-10 ENCOUNTER — Ambulatory Visit (HOSPITAL_COMMUNITY): Admission: RE | Admit: 2010-04-10 | Discharge: 2010-04-10 | Payer: Self-pay | Admitting: Internal Medicine

## 2010-04-10 LAB — CBC WITH DIFFERENTIAL/PLATELET
EOS%: 1.1 % (ref 0.0–7.0)
Eosinophils Absolute: 0.1 10*3/uL (ref 0.0–0.5)
LYMPH%: 25 % (ref 14.0–49.7)
MCH: 26.2 pg (ref 25.1–34.0)
MCHC: 32.7 g/dL (ref 31.5–36.0)
MCV: 80.2 fL (ref 79.5–101.0)
MONO%: 15.8 % — ABNORMAL HIGH (ref 0.0–14.0)
NEUT#: 3.6 10*3/uL (ref 1.5–6.5)
Platelets: 343 10*3/uL (ref 145–400)
RBC: 4.6 10*6/uL (ref 3.70–5.45)

## 2010-04-10 LAB — COMPREHENSIVE METABOLIC PANEL
AST: 39 U/L — ABNORMAL HIGH (ref 0–37)
Alkaline Phosphatase: 46 U/L (ref 39–117)
BUN: 16 mg/dL (ref 6–23)
Glucose, Bld: 83 mg/dL (ref 70–99)
Sodium: 138 mEq/L (ref 135–145)
Total Bilirubin: 0.7 mg/dL (ref 0.3–1.2)
Total Protein: 7.2 g/dL (ref 6.0–8.3)

## 2010-04-29 IMAGING — CT CT CHEST W/ CM
2 of 4 series · 15 of 36 positions shown, 18 images · IV contrast (agent unspecified)
Comparison: 03/22/2008

CLINICAL DATA: Lung cancer.  Persistent mid chest pain.  Shortness
of breath and cough.

CT CHEST WITH CONTRAST
TECHNIQUE: Multidetector CT imaging of the chest was performed
following the standard protocol during bolus administration of
intravenous contrast.
Contrast: 80 ml Pmnipaque-CZZ

[Series 2: chest_routine 5.0 b40f st · axial · 0.61mm/px · z∈[-307,-7]mm · 12 of 72 slices shown, 15 images]
[im 6/72  mediastinal]
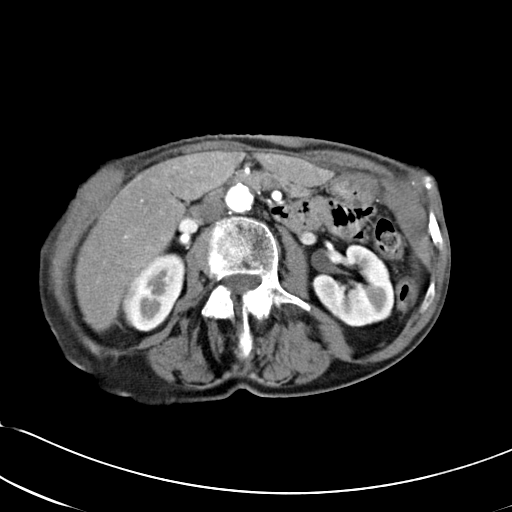
[im 6/72  lung]
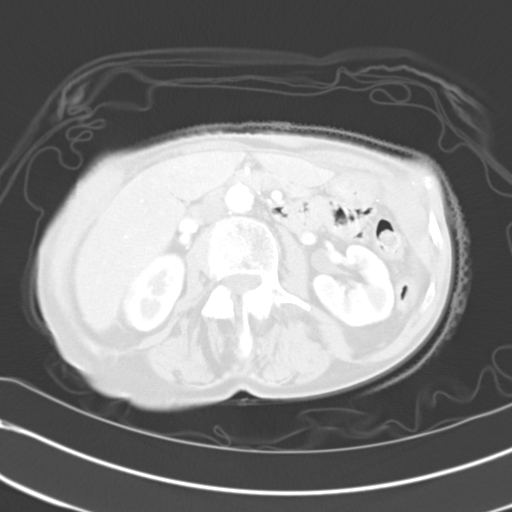
[im 11/72  lung]
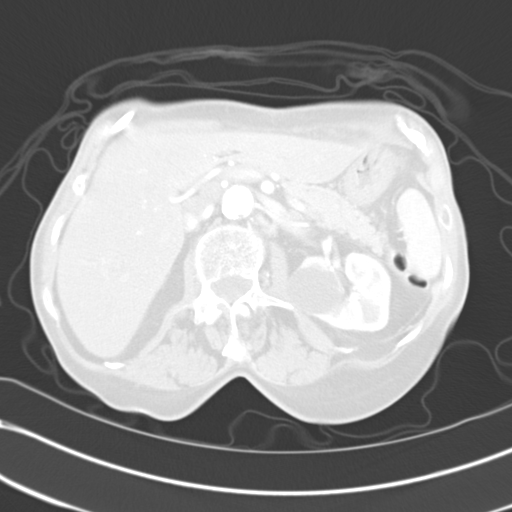
[im 16/72  lung]
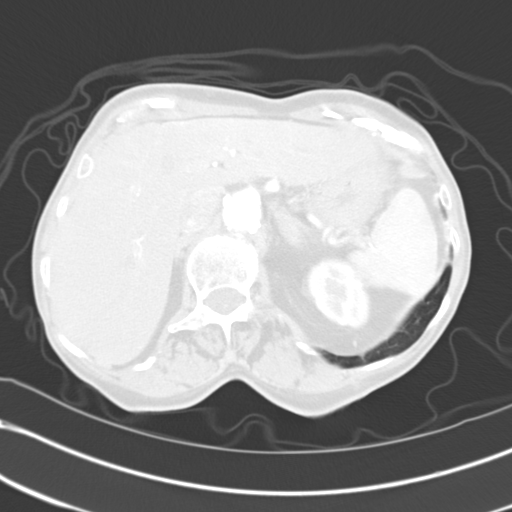
[im 21/72  lung]
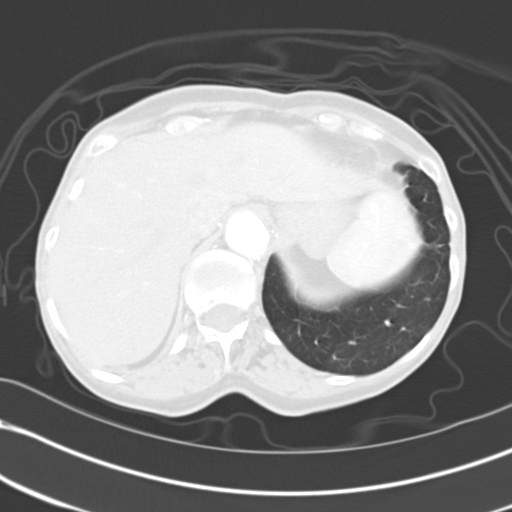
[im 26/72  mediastinal]
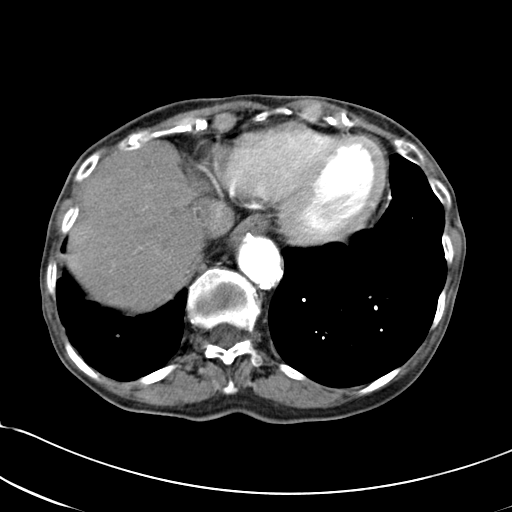
[im 26/72  lung]
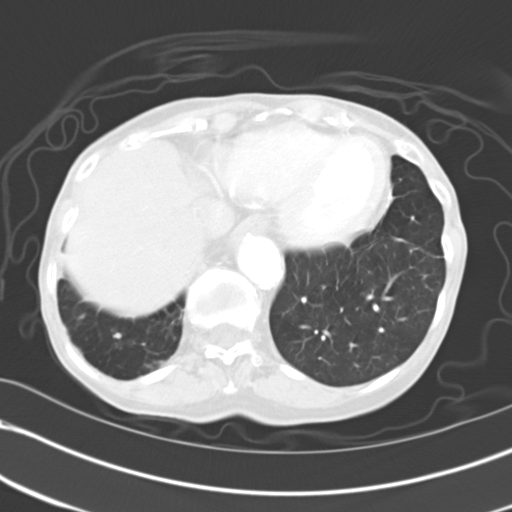
[im 31/72  lung]
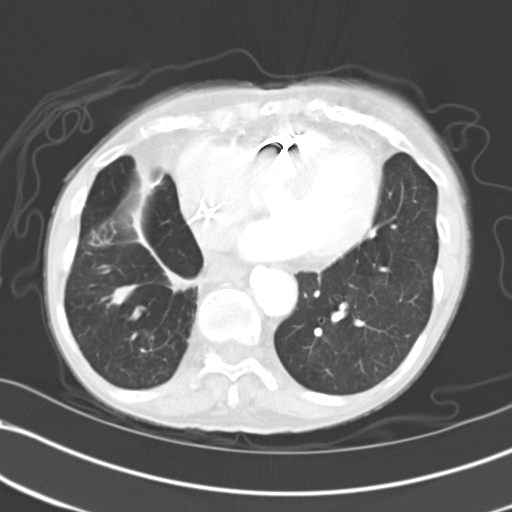
[im 41/72  lung]
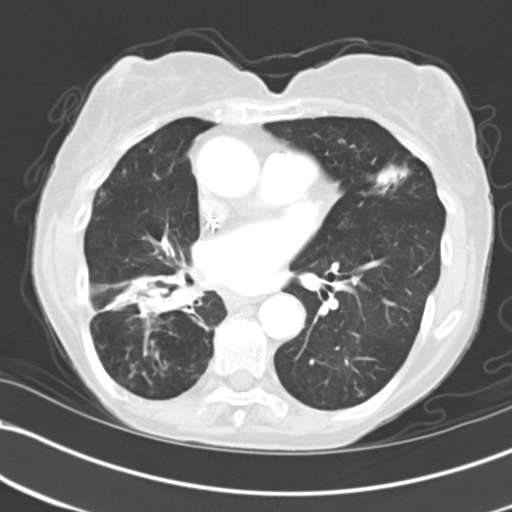
[im 46/72  lung]
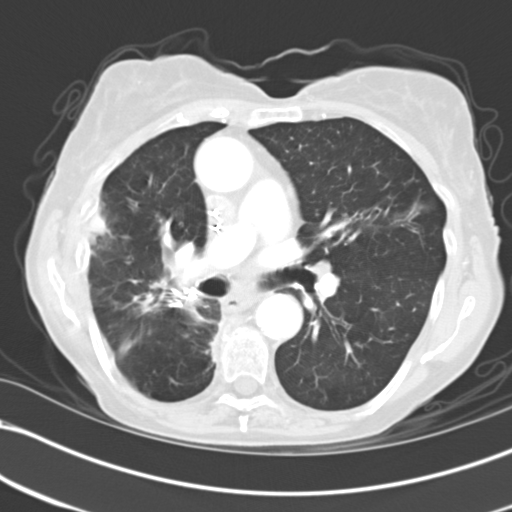
[im 51/72  mediastinal]
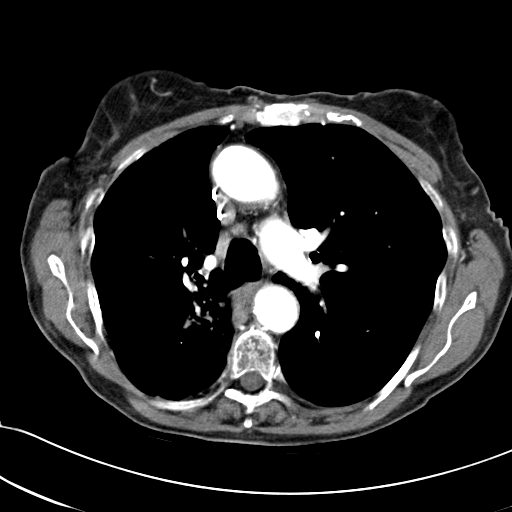
[im 51/72  lung]
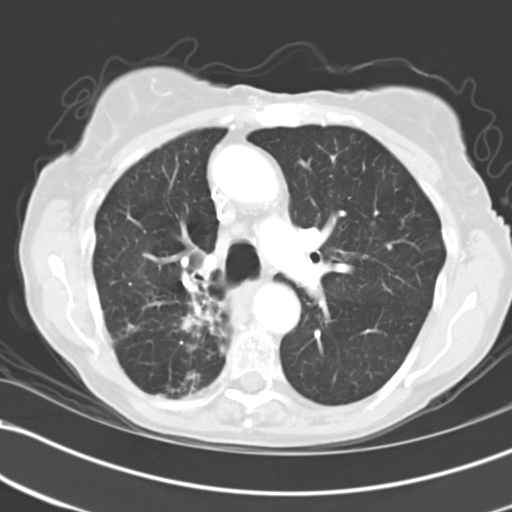
[im 56/72  lung]
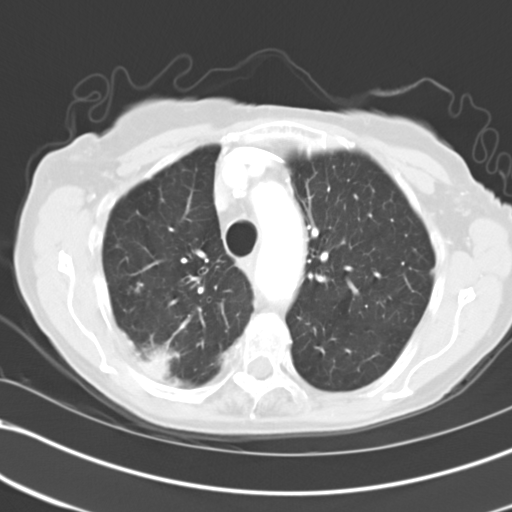
[im 61/72  lung]
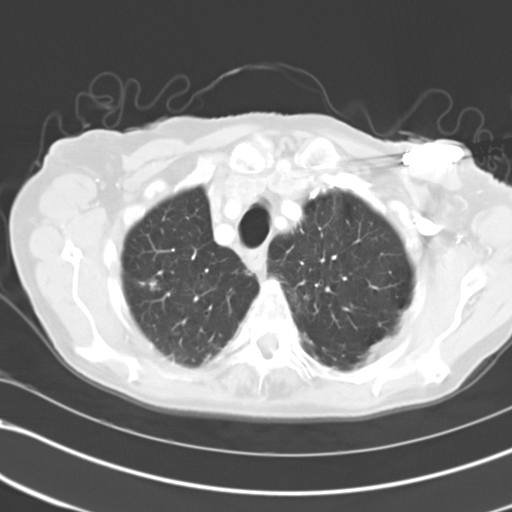
[im 66/72  lung]
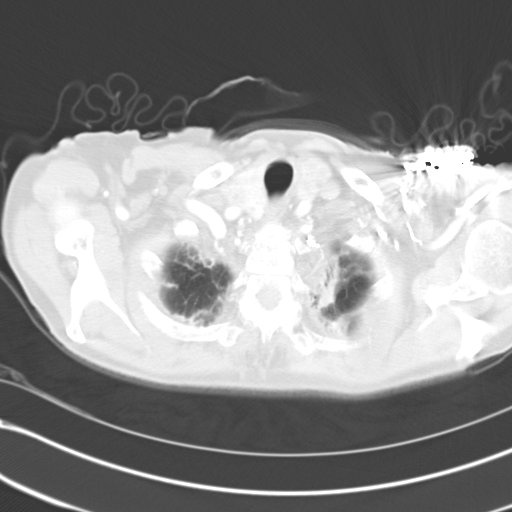

[Series 602: coronal images · coronal · 0.72mm/px · 3 of 72 slices shown]
[im 15/72  lung]
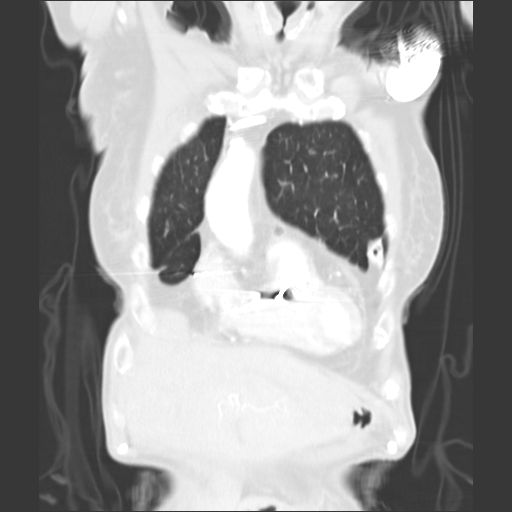
[im 29/72  lung]
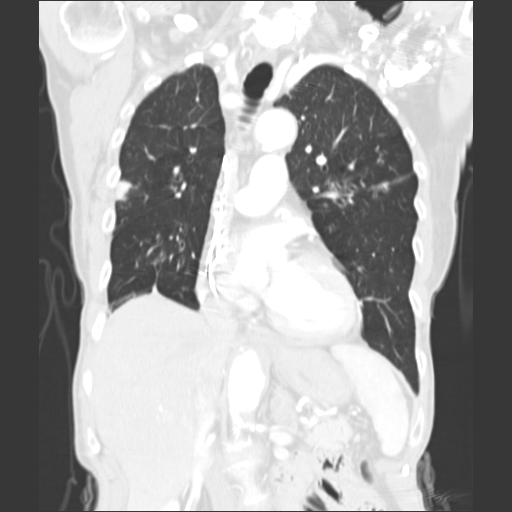
[im 43/72  lung]
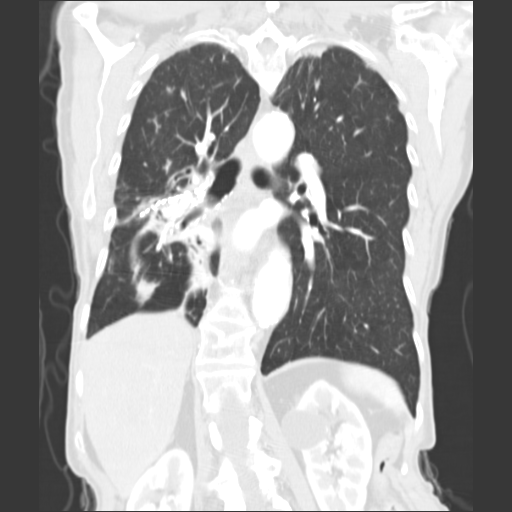

[15 of 36 positions shown; findings below may reference images not displayed]

FINDINGS: Since the prior exam the small nodule seen in the lingula
adjacent to the major fissure on image number 31 has completely
resolved.  A focal area of atelectasis and consolidation in the
adjacent portion of the lingula is unchanged.  No new nodules on
the left.

There has been improvement in several areas of patchy nodularity in
the right lung.  However, the patient now has a new 11 mm  diameter
density in the right lower lobe on image number 41 as well as a
vague area of pleural based density in the lateral aspect of the
right midlung zone and a new patchy peripheral area of density in
the superior aspect of the remaining portion of the right lung
posteriorly.  I suspect most of these are inflammatory in origin,
if not all.  The appearances are similar to focal areas of
abnormalities seen on the prior study.

There is no hilar or mediastinal adenopathy.

Radioactive seeds are seen in the right perihilar region and the
soft tissues in that area appear stable.

The visualized portion of the upper abdomen is stable.  A lesion in
the spleen enhances intensely is stable.
IMPRESSION: Since the prior exam the patient has developed  new peripheral
areas of ill-defined density in the right lung, most likely
inflammatory.

Several similar areas seen on the prior exam have resolved as
described above.  Has the patient had chemotherapy in the interval?

## 2010-05-23 ENCOUNTER — Ambulatory Visit: Payer: Self-pay | Admitting: Pulmonary Disease

## 2010-10-06 ENCOUNTER — Other Ambulatory Visit: Payer: Self-pay | Admitting: Internal Medicine

## 2010-10-06 DIAGNOSIS — C3490 Malignant neoplasm of unspecified part of unspecified bronchus or lung: Secondary | ICD-10-CM

## 2010-10-07 ENCOUNTER — Encounter: Payer: Self-pay | Admitting: Internal Medicine

## 2010-10-07 ENCOUNTER — Encounter: Payer: Self-pay | Admitting: Thoracic Surgery

## 2010-10-16 NOTE — Assessment & Plan Note (Signed)
Summary: acute sick visit for hemoptysis   Copy to:  Mohamed/Edmunds/Burney Primary Provider/Referring Provider:  Kirby Funk  CC:  c/o "spitting up blood" off/on 5 days, went to [facility], Dr. Valentina Lucks stopped ASA Tues., occass. wheezing, and no fcs.  History of Present Illness: The pt comes in today for an acute sick visit related to hemoptysis.  She has known bronchiectasis with a h/o mac, and also s/p right superior segmentectomy with radiation seed implants for BAC.  She gives a 5 day history of intermittant hemoptysis, but it has been in fairly large quantity.  She brings in a mason jar that has a large quantity of blood in it, but no obvious purulence.  She denies chest congestion, and does not believe that she has a "chest cold".  Dr. Valentina Lucks has stopped her aspirin.  She is not having chest discomfort or pleuritic pain.  She has not had any f/c/s.  She denies anorexia and weight loss.  Current Medications (verified): 1)  Metoprolol Tartrate 50 Mg Tabs (Metoprolol Tartrate) .... Take 1 Tablet By Mouth Two Times A Day 2)  Hydrochlorothiazide 25 Mg  Tabs (Hydrochlorothiazide) .... Once Daily 3)  Potassium Chloride 20 Meq/56ml (10%) Liqd (Potassium Chloride) .... Two Times A Day 4)  Adult Aspirin Low Strength 81 Mg  Tbdp (Aspirin) .... Once Daily 5)  Pravastatin Sodium 20 Mg  Tabs (Pravastatin Sodium) .... At Bedtime 6)  Fosamax 70 Mg  Tabs (Alendronate Sodium) .... One Each Week 7)  Bepreve 1.5 % Soln (Bepotastine Besilate) .... Use Two Times A Day 8)  Restasis 0.05 %  Emul (Cyclosporine) .... Two Times A Day As Directed 9)  Zolpidem Tartrate 10 Mg  Tabs (Zolpidem Tartrate) .... At Bedtime As Needed 10)  Travatan 0.004 %  Soln (Travoprost) 11)  Omeprazole 40 Mg  Cpdr (Omeprazole) .... Take 1 Tablet By Mouth Once A Day 12)  Sertraline Hcl 50 Mg  Tabs (Sertraline Hcl) .... Once Daily 13)  Vitamin D 1000 Unit Tabs (Cholecalciferol) .... Take 1 By Mouth Once Daily 14)  Calcium 500/vitamin D  500-125 Mg-Unit Tabs (Calcium Carbonate-Vitamin D) .... Take 2 By Mouth Once Daily 15)  Multivitamins  Tabs (Multiple Vitamin) .... Take 1 By Mouth Once Daily 16)  Fish Oil 1000 Mg Caps (Omega-3 Fatty Acids) .... Take 1 By Mouth Once Daily 17)  Proair Hfa 108 (90 Base) Mcg/act  Aers (Albuterol Sulfate) .Marland Kitchen.. 1-2 Puffs Every 4-6 Hours As Needed 18)  Amiodarone Hcl 200 Mg Tabs (Amiodarone Hcl) .... Take 1 Tablet By Mouth Two Times A Day X 30 Days Then Once Daily 19)  Symbicort 160-4.5 Mcg/act  Aero (Budesonide-Formoterol Fumarate) .... Two Puffs Twice Daily 20)  Norvasc 5 Mg Tabs (Amlodipine Besylate) .... Take 1 Tablet By Mouth Once A Day 21)  Vicodin 5-500 Mg Tabs (Hydrocodone-Acetaminophen) .... As Needed By Mouth  Allergies (verified): No Known Drug Allergies  Past History:  Risk Factors: Smoking Status: quit (04/22/2008)  Review of Systems       The patient complains of productive cough, non-productive cough, and coughing up blood.  The patient denies shortness of breath with activity, shortness of breath at rest, chest pain, irregular heartbeats, acid heartburn, indigestion, loss of appetite, weight change, abdominal pain, difficulty swallowing, sore throat, tooth/dental problems, headaches, nasal congestion/difficulty breathing through nose, sneezing, itching, ear ache, anxiety, depression, hand/feet swelling, joint stiffness or pain, rash, change in color of mucus, and fever.    Vital Signs:  Patient profile:   77 year old  female Height:      60 inches Weight:      114.25 pounds O2 Sat:      97 % on Room air Temp:     97.8 degrees F oral Pulse rate:   74 / minute BP sitting:   140 / 80  (left arm) Cuff size:   regular  Vitals Entered By: Elray Buba RN (October 19, 2009 11:18 AM)  O2 Flow:  Room air  CC: c/o "spitting up blood" off/on 5 days,went to [facility],Dr. Valentina Lucks stopped ASA Tues.,occass. wheezing, no fcs Is Patient Diabetic? No Comments Medications reviewed with  patient  Elray Buba RN  October 19, 2009 11:18 AM    Physical Exam  General:  wd female in nad Nose:  no blood or obvious bleeding site Mouth:  no ulcerations on tongue or posteriorly Lungs:  a few scattered crackles, no wheezing or rhonchi Heart:  rrr Extremities:  no edema or cyanosis Neurologic:  alert and oriented,  moves all 4.   Impression & Recommendations:  Problem # 1:  HEMOPTYSIS UNSPECIFIED (ICD-786.30)  the pt has had a significant amount of hemoptysis over the past 5 days, but currently is quiescent.  This is not uncommon in patients with bronchiectasis, especially when they get infected, but I am concerned about the quantity and the lack on purulence in the mucus.  Given her history of lung cancer and MAC, she will need an airway exam to r/o an endobronchial recurrence and to re-culture for AFB.  The pt is agreeable to this approach.  Medications Added to Medication List This Visit: 1)  Norvasc 5 Mg Tabs (Amlodipine besylate) .... Take 1 tablet by mouth once a day 2)  Vicodin 5-500 Mg Tabs (Hydrocodone-acetaminophen) .... As needed by mouth  Other Orders: Est. Patient Level IV (06269)  Patient Instructions: 1)  will set up for bronchoscopy early next week for airway exam 2)  stay off aspirin 3)  If quantity of blood worsens in the interim, please call us.

## 2010-10-16 NOTE — Progress Notes (Signed)
Summary: results-pt called x 2  Phone Note Call from Patient Call back at Home Phone [phone]   Caller: Patient Call For: clance Summary of Call: pt had bronch last week. wants results. pt called twice. won't leave her home in case dr calls. please call back asap Initial call taken by: Tivis Ringer, CNA,  October 30, 2009 11:03 AM  Follow-up for Phone Call        Please advise results, thanks [name]  October 30, 2009 11:09 AM   Additional Follow-up for Phone Call Additional follow up Details #1::        let her know bronch showed no cancer cells, but did grow a "bug" that I think we should treat. call in Bactrim ds one two times a day for 7 days.  no fills. she needs followup with me in 3wks to check on her progress. Additional Follow-up by: Barbaraann Share MD,  October 30, 2009 5:14 PM    Additional Follow-up for Phone Call Additional follow up Details #2::    Spoke with pt and made aware of results/recs per [facility].  Pt verbalized understanding. Rx sent to pharm and followup sched for 11/20/09. Follow-up by: [name],  October 30, 2009 5:23 PM  New/Updated Medications: BACTRIM DS 800-160 MG TABS (SULFAMETHOXAZOLE-TRIMETHOPRIM) 1 by mouth two times a day Prescriptions: BACTRIM DS 800-160 MG TABS (SULFAMETHOXAZOLE-TRIMETHOPRIM) 1 by mouth two times a day  #14 x 0   Entered by:   [name]   Authorized by:   Barbaraann Share MD   Signed by:   [name] on 10/30/2009   Method used:   Electronically to        [facility] [address] [phone]* (retail)       [address] [place], [place]  [address]       Ph: [phone]       Fax: [phone]   RxID:   [id number]

## 2010-10-16 NOTE — Assessment & Plan Note (Signed)
Summary: rov for bronchiectasis, copd   Copy to:  Mohamed/Edmunds/Burney Primary Rashaun Wichert/Referring Lakedra Washington:  Kirby Funk  CC:  Pt is here for a 6 month f/u appt.  Pt states she has been off her symbicort and states breathing is "ok."  Pt states she rarely uses rescue hfa.  Pt would like rx for Omeprazole 90 day supply x 3 refills.  Pt states she has a cough but states difficulty coughing anything up-will occ get small amounts of yellow sputum. Marland Kitchen  History of Present Illness: the pt comes in today for f/u of her known bronchiectasis and copd.  She has been off symbicort since the last visit by her request and general intolerance to many meds, and feels that breathing is at her usual baseline.  She has not used her rescue inhaler with any frequency.  She has a mild cough, but really no significant mucus.  Overall, she feels that she is stable.  No further hemoptysis   Current Medications (verified): 1)  Metoprolol Tartrate 50 Mg Tabs (Metoprolol Tartrate) .... Take 1 Tablet By Mouth Two Times A Day 2)  Hydrochlorothiazide 25 Mg  Tabs (Hydrochlorothiazide) .... Once Daily 3)  Klor-Con 20 Meq Pack (Potassium Chloride) .... Take 1 Tablet By Mouth Once A Day 4)  Fosamax 70 Mg  Tabs (Alendronate Sodium) .... One Each Week 5)  Bepreve 1.5 % Soln (Bepotastine Besilate) .... Use Two Times A Day 6)  Restasis 0.05 %  Emul (Cyclosporine) .... Two Times A Day As Directed 7)  Zolpidem Tartrate 10 Mg  Tabs (Zolpidem Tartrate) .... At Bedtime As Needed 8)  Travatan 0.004 %  Soln (Travoprost) .... Use Once Daily 9)  Omeprazole 40 Mg  Cpdr (Omeprazole) .... Take 1 Tablet By Mouth Once A Day 10)  Sertraline Hcl 50 Mg  Tabs (Sertraline Hcl) .... Once Daily 11)  Vitamin D 1000 Unit Tabs (Cholecalciferol) .... Take 1 By Mouth Once Daily 12)  Calcium 500/vitamin D 500-125 Mg-Unit Tabs (Calcium Carbonate-Vitamin D) .... Take 2 By Mouth Once Daily 13)  Multivitamins  Tabs (Multiple Vitamin) .... Take 1 By Mouth Once  Daily 14)  Fish Oil 1000 Mg Caps (Omega-3 Fatty Acids) .... Take 1 By Mouth Once Daily 15)  Amiodarone Hcl 200 Mg Tabs (Amiodarone Hcl) .... Take 1 Tablet By Mouth Once A Day 16)  Norvasc 5 Mg Tabs (Amlodipine Besylate) .... Take 1 Tablet By Mouth Once A Day 17)  Vicodin 5-500 Mg Tabs (Hydrocodone-Acetaminophen) .... As Needed By Mouth 18)  Proair Hfa 108 (90 Base) Mcg/act  Aers (Albuterol Sulfate) .... 2 Puffs Every 4-6 Hours As Needed  Allergies (verified): No Known Drug Allergies  Past History:  Past Medical History:  H/o stage 1 BAC RML 2007--s/p wedge resection from RML, and superior segment RLL resection.  s/p seed implants. 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  The patient denies shortness of breath at rest, coughing up blood, chest pain, irregular heartbeats, acid heartburn, indigestion, loss of appetite, weight change, abdominal pain, difficulty swallowing, sore throat, tooth/dental problems, nasal congestion/difficulty breathing through nose, sneezing, ear ache, anxiety, depression, hand/feet swelling, rash, change in color of mucus, and fever.    Vital Signs:  Patient profile:   77 year old female Height:  60 inches Weight:      116 pounds BMI:     22.74 O2 Sat:      96 % on Room air Temp:     98.0 degrees F oral Pulse rate:   94 / minute BP sitting:   134 / 80  (left arm) Cuff size:   regular  Vitals Entered By: Arman Filter LPN (May 23, 2010 1:53 PM)  O2 Flow:  Room air CC: Pt is here for a 6 month f/u appt.  Pt states she has been off her symbicort and states breathing is "ok."  Pt states she rarely uses rescue hfa.  Pt would like rx for  Omeprazole 90 day supply x 3 refills.  Pt states she has a cough but states difficulty coughing anything up-will occ get small amounts of yellow sputum.  Comments Medications reviewed with patient  Arman Filter LPN  May 23, 2010 1:53 PM    Physical Exam  General:  wd female in nad Nose:  no obvious drainage or purulence Lungs:  mildly decreased bs, no wheezing or rhonchi Heart:  rrr Extremities:  no edema or cyanosis  Neurologic:  alert and oriented, moves all 4.   Impression & Recommendations:  Problem # 1:  BRONCHIECTASIS (ICD-494.0) the pt denies any recent infection or acute exacerbation.  I have reviewed with her again the "red flags" which would require abx treatment, and we need to keep in mind that she is colonized with stenotrophomonas any time she gets sick (ie would treat with levaquin).  Problem # 2:  COPD (ICD-496) the pt feels that she is doing just as well off symbicort.  I have asked her to keep rescue inhaler handy if needed, and to stay as active as possible  Other Orders: Est. Patient Level III (60454) Flu Vaccine 37yrs + (09811) Administration Flu vaccine - MCR (B1478)  Patient Instructions: 1)  continue current meds. 2)  stay as active as possible 3)  will give flu shot today 4)  followup with me in 6mos.  Prescriptions: OMEPRAZOLE 40 MG  CPDR (OMEPRAZOLE) Take 1 tablet by mouth once a day  #90 x 4   Entered and Authorized by:   Barbaraann Share MD   Signed by:   Barbaraann Share MD on 05/23/2010   Method used:   Print then Give to Patient   RxID:   [id number]          Flu Vaccine Consent Questions     Do you have a history of severe allergic reactions to this vaccine? no    Any prior history of allergic reactions to egg and/or gelatin? no    Do you have a sensitivity to the preservative Thimersol? no    Do you have a past history of Guillan-Barre Syndrome? no    Do you currently have an acute febrile illness? no    Have you ever  had a severe reaction to latex? no    Vaccine information given and explained to patient? yes    Are you currently pregnant? no    Lot Number:[id number]   Exp Date:03/16/2011   Manufacturer: [employer]    Site Given  Right Deltoid Arman Filter LPN  May 23, 2010 2:55 PM  Appended Document: rov for bronchiectasis, copd megan, see if they ever finalized the afb culture from bronch done in feb this year.  thanks.  Appended Document: rov for bronchiectasis, copd Printed off and put in very important look at  folder.   Appended Document: rov for bronchiectasis, copd printed off what??  Appended Document: rov for bronchiectasis, copd Paper off echart for finalized afb culture from bronch

## 2010-10-16 NOTE — Assessment & Plan Note (Signed)
Summary: rov for bronchiectasis, copd   Copy to:  Mohamed/Edmunds/Burney Primary Provider/Referring Provider:  Kirby Funk  CC:  Pt is here for a f/u appt.  Pt states breathing is unchanged from last visit.  Pt would like to discuss bronch results in detail.  Pt states occ cough with yellow sputum.  Pt c/o difficulty coughing up sputum. Pt states she stopped taking Symbicort.  Pt states it made her "mouth sore" and she couldn't tell a difference in her breathing while on the med.  Marland Kitchen  History of Present Illness: the pt comes in today for f/u after her recent bronchoscopy for hemoptysis.  There was slight heme staining in posterior segment RUL, but no frank bleeding or abnormal mucosa noted.  BAL/washings were negative for malignancy, and cultures were negative.  The pt has been doing well, with no further hemoptysis.  She has stopped taking symbicort due to mouth "soreness".    Current Medications (verified): 1)  Metoprolol Tartrate 50 Mg Tabs (Metoprolol Tartrate) .... Take 1 Tablet By Mouth Two Times A Day 2)  Hydrochlorothiazide 25 Mg  Tabs (Hydrochlorothiazide) .... Once Daily 3)  Klor-Con 20 Meq Pack (Potassium Chloride) .... Take 1 Tablet By Mouth Once A Day 4)  Pravastatin Sodium 20 Mg  Tabs (Pravastatin Sodium) .... At Bedtime 5)  Fosamax 70 Mg  Tabs (Alendronate Sodium) .... One Each Week 6)  Bepreve 1.5 % Soln (Bepotastine Besilate) .... Use Two Times A Day 7)  Restasis 0.05 %  Emul (Cyclosporine) .... Two Times A Day As Directed 8)  Zolpidem Tartrate 10 Mg  Tabs (Zolpidem Tartrate) .... At Bedtime As Needed 9)  Travatan 0.004 %  Soln (Travoprost) .... Use Once Daily 10)  Omeprazole 40 Mg  Cpdr (Omeprazole) .... Take 1 Tablet By Mouth Once A Day 11)  Sertraline Hcl 50 Mg  Tabs (Sertraline Hcl) .... Once Daily 12)  Vitamin D 1000 Unit Tabs (Cholecalciferol) .... Take 1 By Mouth Once Daily 13)  Calcium 500/vitamin D 500-125 Mg-Unit Tabs (Calcium Carbonate-Vitamin D) .... Take 2 By  Mouth Once Daily 14)  Multivitamins  Tabs (Multiple Vitamin) .... Take 1 By Mouth Once Daily 15)  Fish Oil 1000 Mg Caps (Omega-3 Fatty Acids) .... Take 1 By Mouth Once Daily 16)  Proair Hfa 108 (90 Base) Mcg/act  Aers (Albuterol Sulfate) .Marland Kitchen.. 1-2 Puffs Every 4-6 Hours As Needed 17)  Amiodarone Hcl 200 Mg Tabs (Amiodarone Hcl) .... Take 1 Tablet By Mouth Once A Day 18)  Norvasc 5 Mg Tabs (Amlodipine Besylate) .... Take 1 Tablet By Mouth Once A Day 19)  Vicodin 5-500 Mg Tabs (Hydrocodone-Acetaminophen) .... As Needed By Mouth  Allergies (verified): No Known Drug Allergies  Review of Systems      See HPI  Vital Signs:  Patient profile:   77 year old female Height:      60 inches Weight:      115.38 pounds BMI:     22.62 O2 Sat:      96 % on Room air Temp:     97.6 degrees F oral Pulse rate:   70 / minute BP sitting:   114 / 64  (left arm) Cuff size:   regular  Vitals Entered By: Arman Filter LPN (November 21, 1306 1:45 PM)  O2 Flow:  Room air CC: Pt is here for a f/u appt.  Pt states breathing is unchanged from last visit.  Pt would like to discuss bronch results in detail.  Pt states occ cough  with yellow sputum.  Pt c/o difficulty coughing up sputum. Pt states she stopped taking Symbicort.  Pt states it made her "mouth sore" and she couldn't tell a difference in her breathing while on the med.   Comments Medications reviewed with patient Arman Filter LPN  November 20, 1608 1:47 PM    Physical Exam  General:  wd female in nad Lungs:  a few scattered crackles, no wheezing or rhonchi Heart:  rrr Extremities:  no edema or cyanosis Neurologic:  alert and oriented, moves all 4.   Impression & Recommendations:  Problem # 1:  BRONCHIECTASIS (ICD-494.0) Bronchoscopy failed to show endobronchial recurrence of her cancer, and her afb culture has been negative so far (she has history of BAC and MAC).  She has had no further hemoptysis, and therefore will just follow things for  now.  Problem # 2:  COPD (ICD-496) the pt has moderate to severe airflow obstruction on recent pfts secondary to her bronchiectasis (past pfts have shown no obstruction).  She does not want to stay on symbicort, and overall has an adversity to meds in general.  Will therefore treat her with as needed albuterol alone, and she understands that her breathing will not be optimal on this.  It is her own decision.  Medications Added to Medication List This Visit: 1)  Klor-con 20 Meq Pack (Potassium chloride) .... Take 1 tablet by mouth once a day 2)  Travatan 0.004 % Soln (Travoprost) .... Use once daily 3)  Amiodarone Hcl 200 Mg Tabs (Amiodarone hcl) .... Take 1 tablet by mouth once a day 4)  Proair Hfa 108 (90 Base) Mcg/act Aers (Albuterol sulfate) .... 2 puffs every 4-6 hours as needed  Other Orders: Est. Patient Level III (96045)  Patient Instructions: 1)  ok to stay off symbicort. 2)  keep rescue inhaler available...proair 2 puffs every 4-6hrs if needed for increased shortness of breath. 3)  followup with me in 6mos or sooner if having problems   Prescriptions: PROAIR HFA 108 (90 BASE) MCG/ACT  AERS (ALBUTEROL SULFATE) 2 puffs every 4-6 hours as needed  #1 x 6   Entered and Authorized by:   Barbaraann Share MD   Signed by:   Barbaraann Share MD on 11/20/2009   Method used:   Print then Give to Patient   RxID:   [id number]

## 2010-11-19 ENCOUNTER — Ambulatory Visit (INDEPENDENT_AMBULATORY_CARE_PROVIDER_SITE_OTHER): Payer: Medicare Other | Admitting: Pulmonary Disease

## 2010-11-19 ENCOUNTER — Encounter: Payer: Self-pay | Admitting: Pulmonary Disease

## 2010-11-19 DIAGNOSIS — J449 Chronic obstructive pulmonary disease, unspecified: Secondary | ICD-10-CM

## 2010-11-19 DIAGNOSIS — J479 Bronchiectasis, uncomplicated: Secondary | ICD-10-CM

## 2010-11-27 NOTE — Assessment & Plan Note (Signed)
Summary: rov for bronchiectasis/copd   Copy to:  Mohamed/Edmunds/Burney Primary Provider/Referring Provider:  Kirby Funk  CC:  6 month f/u appt for bronchiectasis.  Pt states sob with exertion- better at rest.   States productive cough with small amounts of yellow sputum. Marland Kitchen  History of Present Illness: the pt comes in today for f/u of her known bronchiectasis with copd.  At the last visit, she decided to stay off symbicort, and to use albuterol alone as needed.  She feels that her breathing is at baseline, and has not had a recent pulmonary infection or exacerbation of her copd since her last visit here.    Current Medications (verified): 1)  Metoprolol Tartrate 50 Mg Tabs (Metoprolol Tartrate) .... Take 1 Tablet By Mouth Two Times A Day 2)  Hydrochlorothiazide 25 Mg  Tabs (Hydrochlorothiazide) .... Once Daily 3)  Klor-Con 20 Meq Pack (Potassium Chloride) .... Take 1 Tablet By Mouth Once A Day 4)  Bepreve 1.5 % Soln (Bepotastine Besilate) .... Use Two Times A Day 5)  Restasis 0.05 %  Emul (Cyclosporine) .... Two Times A Day As Directed 6)  Zolpidem Tartrate 10 Mg  Tabs (Zolpidem Tartrate) .... At Bedtime As Needed 7)  Travatan 0.004 %  Soln (Travoprost) .... Use Once Daily 8)  Omeprazole 40 Mg  Cpdr (Omeprazole) .... Take 1 Tablet By Mouth Once A Day 9)  Sertraline Hcl 100 Mg Tabs (Sertraline Hcl) .... Take 1 Tablet By Mouth Once A Day 10)  Vitamin D 1000 Unit Tabs (Cholecalciferol) .... Take 1 By Mouth Once Daily 11)  Calcium 500/vitamin D 500-125 Mg-Unit Tabs (Calcium Carbonate-Vitamin D) .... Take 2 By Mouth Once Daily 12)  Multivitamins  Tabs (Multiple Vitamin) .... Take 1 By Mouth Once Daily 13)  Fish Oil 1000 Mg Caps (Omega-3 Fatty Acids) .... Take 1 By Mouth Once Daily 14)  Amiodarone Hcl 200 Mg Tabs (Amiodarone Hcl) .... Take 1 Tablet By Mouth Once A Day 15)  Norvasc 5 Mg Tabs (Amlodipine Besylate) .... Take 1 Tablet By Mouth Once A Day 16)  Proair Hfa 108 (90 Base) Mcg/act  Aers  (Albuterol Sulfate) .... 2 Puffs Every 4-6 Hours As Needed 17)  Forteo 600 Mcg/2.62ml Soln (Teriparatide (Recombinant)) .... Once Daily 18)  Lastacaft 0.25 % Soln (Alcaftadine) .... Once Daily 19)  Aspirin Low Dose 81 Mg Tabs (Aspirin) .... Take 1 Tablet By Mouth Once A Day 20)  Biotin .... Take 1 Tablet By Mouth Once A Day 21)  Ferrous Sulfate 325 (65 Fe) Mg Tabs (Ferrous Sulfate) .... Take 1 Tablet By Mouth Once A Day  Allergies (verified): No Known Drug Allergies  Review of Systems       The patient complains of shortness of breath with activity, productive cough, chest pain, irregular heartbeats, and headaches.  The patient denies shortness of breath at rest, non-productive cough, coughing up blood, acid heartburn, indigestion, loss of appetite, weight change, abdominal pain, difficulty swallowing, sore throat, tooth/dental problems, nasal congestion/difficulty breathing through nose, sneezing, itching, ear ache, anxiety, depression, hand/feet swelling, joint stiffness or pain, rash, change in color of mucus, and fever.    Vital Signs:  Patient profile:   77 year old female Height:      60 inches Weight:      121.25 pounds BMI:     23.77 O2 Sat:      96 % on Room air Temp:     97.6 degrees F oral Pulse rate:   65 / minute BP sitting:  102 / 60  (left arm) Cuff size:   regular  Vitals Entered By: Arman Filter LPN (November 19, 5407 1:34 PM)  O2 Flow:  Room air CC: 6 month f/u appt for bronchiectasis.  Pt states sob with exertion- better at rest.   States productive cough with small amounts of yellow sputum.  Comments Medications reviewed with patient Arman Filter LPN  November 18, 8117 1:38 PM    Physical Exam  General:  wd female in nad  Lungs:  scattered crackles with no wheezing or rhonchi good airflow Heart:  rrr Extremities:  no significant edema or cyanosis Neurologic:  alert and oriented, moves all 4.    Impression & Recommendations:  Problem # 1:  BRONCHIECTASIS  (ICD-494.0) the pt has had no recent flare, and has minimal mucus at this time.  Problem # 2:  COPD (ICD-496) She has decided to stay off a maintenance bronchodilator, but has albuterol to use as needed.  She feels her exertional tolerance is stable.  Medications Added to Medication List This Visit: 1)  Sertraline Hcl 100 Mg Tabs (Sertraline hcl) .... Take 1 tablet by mouth once a day 2)  Forteo 600 Mcg/2.75ml Soln (Teriparatide (recombinant)) .... Once daily 3)  Lastacaft 0.25 % Soln (Alcaftadine) .... Once daily 4)  Aspirin Low Dose 81 Mg Tabs (Aspirin) .... Take 1 tablet by mouth once a day 5)  Biotin  .... Take 1 tablet by mouth once a day 6)  Ferrous Sulfate 325 (65 Fe) Mg Tabs (Ferrous sulfate) .... Take 1 tablet by mouth once a day  Other Orders: Est. Patient Level III (14782)  Patient Instructions: 1)  no change in breathing meds for now. Let us know if your breathing worsens, and we can start back on something consistently for your copd 2)  stay as active as possible 3)  followup with me in 6mos.

## 2010-12-02 LAB — SAMPLE TO BLOOD BANK

## 2010-12-02 LAB — CBC
Hemoglobin: 12.1 g/dL (ref 12.0–15.0)
MCHC: 31.7 g/dL (ref 30.0–36.0)
MCV: 79.2 fL (ref 78.0–100.0)
RBC: 4.82 MIL/uL (ref 3.87–5.11)

## 2010-12-02 LAB — COMPREHENSIVE METABOLIC PANEL
CO2: 29 mEq/L (ref 19–32)
Calcium: 9.3 mg/dL (ref 8.4–10.5)
Creatinine, Ser: 0.79 mg/dL (ref 0.4–1.2)
GFR calc non Af Amer: >60 mL/min (ref >60)
Glucose, Bld: 99 mg/dL (ref 70–99)

## 2010-12-02 LAB — APTT: aPTT: 33 seconds (ref 24–37)

## 2010-12-02 LAB — DIFFERENTIAL
Lymphocytes Relative: 15 % (ref 12–46)
Lymphs Abs: 1 10*3/uL (ref 0.7–4.0)
Neutro Abs: 4.5 10*3/uL (ref 1.7–7.7)
Neutrophils Relative %: 72 % (ref 43–77)

## 2010-12-02 LAB — PROTIME-INR
INR: 0.99 (ref 0.00–1.49)
Prothrombin Time: 13 seconds (ref 11.6–15.2)

## 2010-12-05 LAB — CULTURE, RESPIRATORY W GRAM STAIN

## 2010-12-05 LAB — AFB CULTURE WITH SMEAR (NOT AT ARMC): Acid Fast Smear: NONE SEEN

## 2010-12-05 LAB — FUNGUS CULTURE W SMEAR: Fungal Smear: NONE SEEN

## 2010-12-26 LAB — CBC
HCT: 37.6 % (ref 36.0–46.0)
Hemoglobin: 12.3 g/dL (ref 12.0–15.0)
MCHC: 32.7 g/dL (ref 30.0–36.0)
MCV: 83.6 fL (ref 78.0–100.0)
Platelets: 299 10*3/uL (ref 150–400)
RDW: 15.4 % (ref 11.5–15.5)

## 2011-01-29 NOTE — Letter (Signed)
March 30, 2008   Velora Heckler. Arbutus Ped, MD  [address] [place], [place] [address]   Re:  [name], [name]                  DOB:  [birth year]   Dear Shirline Frees,   I saw the patient back in the office today and her repeat CT scan shows  extensive emphysema and a questionable new nodular capacity in the right  upper lobe superior to where we did her resection site, and nodules on  the left side appeared to be stable.  Although, there is a question of  new subpleural lingular nodule, it is really hard to say about the  patient.  She still hurts a lot and feels very poorly.  I think, she may  have a recurrence of her cancer, but it would be difficult to prove  without an open biopsy.  I agree with your interpretation that we should  just continue to follow this and will see her back again in 3 months  with another CT scan.  Her blood pressure is 128/76, pulse 74,  respirations 20, and sats were 96%.  The patient does not want anything  invasive done at the present time.   Ines Bloomer, M.D.  Electronically Signed   DPB/MEDQ  D:  03/30/2008  T:  03/31/2008  Job:  [id number]

## 2011-01-29 NOTE — Letter (Signed)
June 24, 2007   Barbaraann Share, MD,FCCP  [address]  [place], [place] [address]   Re:  SHAKETA, SERAFIN                  DOB:  05-21-1927   Dear Mellody Dance:   I saw Ms. Dudenhoeffer in the office today and from our standpoint, the  recent CT scan shows the seeds are in good position with no evidence of  recurrence of her tumor.  It also shows that the nodularity in the right  upper lobe has somewhat improved since starting on her treatment of  Biaxin, ethambutol and Rifampin.  She also, since we saw her last, has  had a pacemaker inserted for bradycardia, so I think she is slowly  improving and says she feels a little better, although she is still  coughing a lot.  I agree with you that this could possibly be atypical  TB and at least she has gotten a clinical response to the medication.  I  will see her back again in six months with a chest x-ray.  Her blood  pressure was 128/77, pulse 60, respirations 18, sats were 97%.   Ines Bloomer, M.D.  Electronically Signed   DPB/MEDQ  D:  06/24/2007  T:  06/25/2007  Job:  [id number]   cc:   Lajuana Matte, MD

## 2011-01-29 NOTE — Letter (Signed)
June 29, 2008   Lajuana Matte, MD  [phone]  [place], [place] [address]   Re:  RAQUELLE, PIETRO                  DOB:  Sep 16, 1927   Dear Arbutus Ped:   The patient came today and her CT scan showed that several areas that we  were worried about of not clear, but there are some other areas, all  that points towards an inflammatory lung disease.  She has apparently  been treated by Dr. Shelle Iron for this.  Her blood pressure is 169/88,  pulse 75, respirations 18, and sats were 97%.  She will get another CT  scan in 3 months and I will let you all follow her.  I will be happy to  see her again at anytime.   Ines Bloomer, M.D.  Electronically Signed   DPB/MEDQ  D:  06/29/2008  T:  06/29/2008  Job:  [id number]   cc:   Barbaraann Share, MD,FCCP

## 2011-01-29 NOTE — Assessment & Plan Note (Signed)
[facility]                             PULMONARY OFFICE NOTE   [name], [name]                   MRN:          [id number]  DATE:07/08/2007                            DOB:          1926-11-19    SUBJECTIVE:  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  The  patient had undergone bronchoscopy which did reveal a Staph aureus on a  routine culture, but the AFB smear was positive as well.  The culture  ultimately returned positive for Mycobacterium avium intracellulare.  The patient was treated with Augmentin for her sensitive Staph and saw  no improvement in her malaise, cough, or decreased appetite.  Once the  TB culture returned positive for MAC, it was obvious this was the  primary issue.  The patient was placed on Biaxin, Ethambutol, and  Rifampin three times a week per [employer] protocol and  was asked to follow up with me at this time.  In the interim the patient  has had a CT scan by Dr. Arbutus Ped which showed no change in her scarring  and density around her resection and seed implant; however, there has  been improvement in her nodular infiltrate since she has been on these  medications.  The patient states that she is much improved in terms of  cough and her appetite and weight have increased as well.  She has had  excellent tolerance to the medication.   PHYSICAL EXAMINATION:  GENERAL:  She is a thin, frail-appearing white  female in no acute distress.  VITAL SIGNS:  Blood pressure is 138/86, pulse 65, temperature is 98.8,  weight is 109 pounds.  O2 saturation on room air is 98%.  CHEST:  Reveals a few crackles but no rhonchi or wheezes.  CARDIAC:  Reveals regular rate and rhythm.   IMPRESSION:  Bronchiectasis with Mycobacterium avium-intracellulare  infection.  The patient is a three drug regimen three times weekly and  has had excellent  medication tolerance and clearly has had improvement  in both her symptoms as well as her x-rays.  Unfortunately she will need  to continue on this for 6-12 months with the latter being the most  likely appropriate course.  The patient is aware of the need to have  ophthalmology follow her closely while being on the Ethambutol and we  will check liver function tests today to make sure that this has not  been an issue.  Overall I am quite pleased with her progress and we need  to continue on the above regimen.   PLAN:  1. Continue on MAC medications.  2. Check hepatic profile today.  3. The patient is aware of seeing an ophthalmologist while being on      Ethambutol.  4. Flu shot today.  5. The patient will follow up in approximately three months or sooner      if there are problems.     Barbaraann Share, MD,FCCP  Electronically Signed    [facility]/MedQ  DD: 07/08/2007  DT: 07/08/2007  Job #: [id number]   cc:  Lajuana Matte, MD  Thora Lance, M.D.  Francisca December, M.D.

## 2011-01-29 NOTE — Letter (Signed)
December 23, 2007   Barbaraann Share, MD,FCCP  [address]  [place],  [place] [address]   Re:  KAELIE, HENIGAN                  DOB:  1927-09-04   Dear Mellody Dance:   I saw Ms. Cinelli back in the office today and she has been feeling  better.  She had a CT scan that showed a 5-mm nodule in the left lower  lobe which we will need to follow.  This was apparently a new nodule.  I  talked to her about this and said that just following is all that needs  to be done.  She still has some chest pain that is intermittent that may  or may not be related to her surgery.  Her lungs are clear to percussion  and auscultation.  Her blood pressure is 140/77, pulse 74, respiration  18.  Saturations were 97%.   I will see her again in 3 months after the next CT scan.   Ines Bloomer, M.D.  Electronically Signed   DPB/MEDQ  D:  12/23/2007  T:  12/23/2007  Job:  [id number]   cc:   Lajuana Matte, MD

## 2011-01-29 NOTE — Op Note (Signed)
NAMEJIA, DOTTAVIO            ACCOUNT NO.:  [id number]   MEDICAL RECORD NO.:  [id number]          PATIENT TYPE:  AMB   LOCATION:  CARD                         FACILITY:  [facility]   PHYSICIAN:  Barbaraann Share, MD,FCCPDATE OF BIRTH:  Jan 17, 1927   DATE OF PROCEDURE:  05/13/2007  DATE OF DISCHARGE:                               OPERATIVE REPORT   PROCEDURE:  Flexible fiberoptic bronchoscopy.   INDICATION:  Infiltrate involving anterior segment of right upper lobe  and right middle lobe of unknown etiology.   OPERATOR:  Barbaraann Share, MD.   ANESTHESIA:  Versed 7.5 mg IV in various aliquots as well as Demerol 50  mg IV and topical 1% lidocaine to vocal cords and airways during  procedure.   DESCRIPTION:  After obtaining informed consent and under close  cardiopulmonary monitoring, the above preop anesthesia was given and  fiberoptic scope was passed to the right naris and into the posterior  pharynx where there was no lesions or other abnormalities seen.  The  vocal cords appeared to be within normal limits and moved bilaterally on  phonation.  The scope then passed into the trachea where it was examined  along its entire length down to the level of carina all of which was  normal.  The left tracheobronchial tree with was examined serially to  the subsegmental level with no endobronchial abnormality being found.  The right tracheobronchial tree was then examined and the only  abnormality found was what appeared to be an absence of the bronchus to  the superior segment of the right lower lobe and also some edema and  erythema at the orifice to the right middle lobe laterally in the distal  bronchus intermedius.  Bronchoalveolar lavage was then done from the  anterior segment of the right upper lobe as well as both segments of the  right middle lobe.  This was sent for the usual cytologic and  bacteriologic evaluations.  A Wang needle was then introduced x2 into  the abnormal  swelling in the distal bronchus intermedius laterally at  the orifice to the right middle lobe.  Adequate material was obtained.  Overall the patient tolerated the procedure well.  There were no  complications.      Barbaraann Share, MD,FCCP  Electronically Signed     [facility]/MEDQ  D:  05/13/2007  T:  05/14/2007  Job:  [id number]   cc:   Lajuana Matte, MD  Fax: [phone]

## 2011-01-29 NOTE — Assessment & Plan Note (Signed)
[facility]                             PULMONARY OFFICE NOTE   [name], [name]                   MRN:          [id number]  DATE:05/06/2007                            DOB:          09-Jul-1927    REFERRING PHYSICIAN:  Lajuana Matte, MD   HISTORY OF PRESENT ILLNESS:  The patient is a very pleasant 77 year old  female who has been referred for evaluation of pulmonary infiltrate.  The patient was diagnosed with non-small cell lung cancer in July of  2007 after transthoracic needle aspiration.  She ultimately was found to  have stage 1A disease.  The patient ultimately underwent VATS in July of  2007 where a wedge of the right middle lobe showed bronchiectasis with  changes of chronic inflammation.  She also had a right lower superior  segmentectomy with radiation seed implant.  Diagnosis was positive for  bronchoalveolar cell carcinoma.  The patient has done very, very well  with her cancer and had a followup scan in February of 2008 that was  without change.  However, the recent scan on April 22, 2007 did show  some soft tissue density in the right hilum in the area of prior surgery  and brachytherapy.  There also appeared to be a tree-in-bud opacity  involving the right upper lobe and right lower lobes with nodular  densities.  There was also some bronchiectatic change and scarring in  the lingula.  The patient's states that she really has not felt good for  the last year.  She has a significant cough but no significant mucus  production.  She does have a lot of rattling and feels that her  mucociliary clearance is very poor.  The patient has had a poor appetite  and has been slowly loosing weight.  She has been weak but denies any  night sweats or fever.  The patient does complain of a globus sensation  in her throat but denies any postnasal drip or GERD.  She has been on  Prilosec for about the last 1 week.  The patient denies any TB  exposure,  and it is unclear of her PPD status.  She did for 30 years in [place].  The patient has been treated with Spiriva and Symbicort without change  in her breathing; therefore, this was discontinued.  She states that she  will get short of breath with vacuuming 1 room of her house and also  making a bed.  She will get somewhat dyspneic bringing 1 bag of  groceries in from the car.   PAST MEDICAL HISTORY:  1. Hypertension.  2. History of atrial fibrillation and ultimately permanent pacemaker.  3. History of dyslipidemia.  4. History of lung cancer, as stated above.  5. History of laminectomy.  6. History of cataract surgery.  7. History of goiter.   CURRENT MEDICATIONS:  1. Nadolol 20 mg b.i.d.  2. Hydrochlorothiazide 25 mg daily.  3. Diltiazem 120 daily.  4. Amiodarone 200 mg daily.  5. Warfarin at varying doses.  6. Potassium 10 mEq daily.  7. Aspirin 81 mg daily.  8.  Pravastatin 20 mg q.h.s.  9. Sertraline 50 mg 1/2 daily.  10.Fosamax of unknown dose weekly.  11.Xalatan ophthalmic drops daily.  12.Zolpidem p.r.n.   ALLERGIES:  NO KNOWN DRUG ALLERGIES.   SOCIAL HISTORY:  She is widowed and has children.  She lives by herself.  She has a history of smoking 1/2 pack per day for 50 years.  She has not  smoked in 2 years.   FAMILY HISTORY:  Remarkable for her sister having emphysema, father  having heart disease, and her sister had lung cancer.   REVIEW OF SYSTEMS:  As per history of present illness.  Also see patient  intake form documented on the chart.   PHYSICAL EXAMINATION:  GENERAL:  She is a frail-appearing white female  in no acute distress.  VITAL SIGNS:  Blood pressure 124/78, pulse 68, temperature 97.9, weight  107 pounds.  She is 5 feet tall.  O2 saturation on room air is 96%.  HEENT:  Pupils equal, round, reactive to light and accommodation.  Extraocular muscles are intact.  Nares are patent without discharge.  Oropharynx is clear.  NECK:  Supple  without JVD or lymphadenopathy.  There is no palpable  thyromegaly.  CHEST:  Decreased breath sounds throughout with a isolated rhonchi in  the right midlung zone posteriorly.  Chest does show mild pectus  deformity.  CARDIAC:  Exam reveals a controlled ventricular response.  ABDOMEN:  Soft, nontender, with good bowel sounds.  GENITAL/RECTAL/BREASTS:  Exams not done.  Not indicated.  EXTREMITIES:  Lower extremities with trace edema.  Pulses are intact  distally.  NEUROLOGIC:  Alert and oriented with no obvious motor deficits.   IMPRESSION:  1. Bronchiectasis with nodular densities in the right middle lobe and      lingula.  There is also evidence for bronchial casts associated      with mucoid impaction.  The most common etiology for this would      either be some resistant drug pathogen such as gram-negative rod or      staph but also atypical mycobacterium.  The patient would fit the      clinical description of [facility] syndrome, with pectus      excavatum, right middle lobe and lingular bronchiectasis, as well      as colonization/infection with atypical mycobacterium.  At this      point in time, I do think that she would benefit from fiberoptic      bronchoscopy with bronchoalveolar lavage for more appropriate      diagnosis.  2. Increased density in the right hilum that more than likely is      related to scar; however, I cannot exclude the possibility of      localized recurrence of cancer.  I think we can do a good airway      exam at the time of bronchoscopy and possibly do Wang needle      aspirations to try and sample the extrabronchial area.   PLAN:  Will schedule for fiberoptic bronchoscopy with BAL and Wang.  The  patient is agreeable to this approach and understands all the potential  complications that I have discussed with her.     Barbaraann Share, MD,FCCP  Electronically Signed    [facility]/MedQ  DD: 05/20/2007  DT: 05/21/2007  Job #: [id number]   cc:    Lajuana Matte, MD  Thora Lance, M.D.  Francisca December, M.D.

## 2011-01-29 NOTE — Assessment & Plan Note (Signed)
[facility]                             PULMONARY OFFICE NOTE   FAYRENE, TOWNER                   MRN:          [id number]  DATE:05/20/2007                            DOB:          June 14, 1927    SUBJECTIVE:  Ms. Walsworth comes in today for followup after her recent  bronchoscopy. At that time she did have some inflammatory changes in the  airway and clearly had mucoid plugs suctioned during her airway lavage.  The cytology was unremarkable and currently the AFB smear is negative,  however, the culture is pending. The patient did grow abundant  staphylococcus aureus that was fairly sensitive to most antibiotics  including oxacillin. It should be noted the patient was treated with  Avelox approximately 4 weeks prior to the bronchoscopy. The Wang needle  aspiration from various areas in the right tracheobronchial tree near  her prior resection showed no malignant cells.   PHYSICAL EXAMINATION:  GENERAL:  She is a frail female in no acute  distress.  VITAL SIGNS:  Blood pressure is 140/82, pulse 60, temperature 98, weight  is 109 pounds. O2 saturation on room air is 96%.   IMPRESSION:  1. Right middle lobe and lingular bronchiectasis with sensitive staph      aureus that is either a colonizer or possibly a true infection. I      continue to worry about colonization or infection with atypical      mycobacterium. It is very common to have negative AFB smears on BAL      and have a positive culture at 6 weeks. Because the patient is      clearly symptomatic at this time with cough and chest congestion as      well as malaise, I will go ahead and treat the staph that has been      isolated at the time of lavage and was felt to be in large quantity      although a colony count was not done. I do anxiously await the      final results of her AFB culture to make sure the true infection      here is not the atypical mycobacterium that is causing her to  have      failure to thrive. The patient is agreeable to this approach.  2. Abnormal density at the site of prior resection and seed implants.      The Wang needle aspiration through this area was unremarkable,      however, I reminded the patient this was a blind biopsy process and      could yield false negative results. However, she and I both are      very reassured this did not yield cancer nor was there      endobronchial disease seen at the prior site of resection. This      will just be followed clinically by Dr. Arbutus Ped.   PLAN:  1. Initiate Augmentin at 875 b.i.d. for the next 10 days.  2. Will call the patient and either arrange for office visit or  discuss with her options once her      AFB culture returns.  3. Followup with the patient's prior cancer with Dr. Arbutus Ped.     Barbaraann Share, MD,FCCP  Electronically Signed    [facility]/MedQ  DD: 05/20/2007  DT: 05/21/2007  Job #: [id number]   cc:   Lajuana Matte, MD  Thora Lance, M.D.  Francisca December, M.D.

## 2011-02-01 NOTE — H&P (Signed)
[name], [name]            ACCOUNT NO.:  [id number]   MEDICAL RECORD NO.:  [id number]          PATIENT TYPE:  INP   LOCATION:  NA                           FACILITY:  [facility]   PHYSICIAN:  Ines Bloomer, M.D. DATE OF BIRTH:  1927-02-20   DATE OF ADMISSION:  DATE OF DISCHARGE:                                HISTORY & PHYSICAL   CHIEF COMPLAINT:  Right middle lobe lung mass.   HISTORY OF PRESENT ILLNESS:  This is a 77 year old patient who was worked up  for hematuria and a CT scan showed a questionable millimeter lung mass and  she underwent a full CT scan which showed a 1 cm nodular area in the medial  segment of the right middle lobe and a questionable right lower lobe lesion.  There was also some changes of scarring in the left lingular area and the  apical area, so she was followed by progressive CT scans.  This was first  noticed in May of 2006.  Progressive CT scans did not change.  A right  middle lobe lesion remained the same at 12 mm and the right lower lobe was  the same.  However, on the final CT scan on June of 2007, the right middle  lobe had increased to 9 mm in size.  She underwent a needle biopsy which  showed non-small cell lung cancer.  Her pulmonary function tests were done  which showed an FVC of 1.76 with an FEV-1 1.12 which was 74% of predicted.  Her diffusion capacity was 63% of predicted.  She was seen by Dr. Margaretmary Bayley for possible seed implantation.  She has had no fever, chills,  excessive sputum.  She quit smoking in March of 2006 but had a long history  of smoking previous to that.   PAST MEDICAL HISTORY:  1.  Significant for hypertension.  2.  Osteoporosis.  3.  Glaucoma.   ALLERGIES:  SHE HAS NO ALLERGIES.   MEDICATIONS:  1.  Nadolol 100 mg twice a day.  2.  Lotrel 5/10 once a day.  3.  Potassium chloride 10 mEq a day.  4.  Hydrochlorothiazide 25 mg a day.  5.  Fosamax 70 mg weekly.  6.  Xalatan eye drops once a day.   FAMILY  HISTORY:  Positive for her father had cardiac disease.  No history of  cancer.   SOCIAL HISTORY:  She has four children, is married.  She is retired.  She  quit smoking in March of 2006 but smoked for over 40-pack years.  Does not  drink alcohol on a regular basis.   REVIEW OF SYSTEMS:  She is 120 pounds.  She is 5 feet 1 inch.  No change in  her weight.  CARDIAC:  She has had palpitations and has a questionable  history of a heart murmur.  No history of angina.  PULMONARY:  No history of  bronchitis, hemoptysis or asthma.  GI:  No nausea, vomiting, constipation or  diarrhea.  GU:  She had previous hematuria, otherwise negative.  VASCULAR:  No claudication, DVT, TIA.  PSYCHIATRIC:  No  psychiatric illness.  NEUROLOGIC:  No headaches, blackouts or seizures or dizziness.  MUSCULOSKELETAL:  No history of arthritis or joint pain.  EENT:  No change  in her eyesight but does have glaucoma.  No problems with her hearing.  Anemia:  No problems with anemia.   PHYSICAL EXAM:  She is a thin Caucasian female in no acute distress.  Her  blood pressure is 128/60, pulse 60, respirations 18, saturations are 96%.  HEAD:  Atraumatic.  EYES:  Pupils equal, reactive to light and accommodation.  Extraocular  movements are normal.  EARS:  Tympanic membranes are intact.  NOSE:  There is no septal deviation.  THROAT:  Without lesion.  NECK:  Supple with no thyromegaly.  No supraclavicular or axillary  adenopathy.  CHEST:  Clear to auscultation and percussion.  HEART:  Regular sinus rhythm.  I did not hear a murmur.  ABDOMEN:  Soft.  No hepatosplenomegaly.  Pulses 2+.  There is no clubbing or  edema.  There is no costovertebral angle tenderness.  NEUROLOGIC:  She is oriented x3.  There are no focal defects.  SKIN:  Without lesion.   IMPRESSION:  1.  Enlarging right lower lobe lesion, non-small cell lung cancer, hopefully      stage IA.  2.  Right middle lobe lesion.  3.  History of hematuria.  4.   Chronic obstructive pulmonary disease.  5.  History of tobacco use.  6.  Hypertension.  7.  Glaucoma.   PLAN:  Right VATS wedge resection of right lower lobe lesion, right middle  lobe lesion with seed implantation.           ______________________________  Ines Bloomer, M.D.     DPB/MEDQ  D:  04/05/2006  T:  04/05/2006  Job:  [id number]

## 2011-02-01 NOTE — H&P (Signed)
[name], Glover            ACCOUNT NO.:  [id number]   MEDICAL RECORD NO.:  [id number]          PATIENT TYPE:  EMS   LOCATION:  ED                           FACILITY:  [facility]   PHYSICIAN:  Francisca December, M.D.  DATE OF BIRTH:  16-Nov-1926   DATE OF ADMISSION:  01/17/2007  DATE OF DISCHARGE:                              HISTORY & PHYSICAL   REASON FOR ADMISSION:  Symptomatic pauses.   HISTORY OF PRESENT ILLNESS:  [name] is a pleasant 77 year old  woman with a previous history of atrial fibrillation who has had a 2-3  week history of a hard aching substernal chest pain that would have a  spontaneous onset and offset lasting 10-15 minutes.  It did not radiate  through to the back, up in the neck, down the arm.  It was not  associated with nausea or diaphoresis.  There was some shortness of  breath.  Her last episode was the day before yesterday.  She presented  today to [facility] emergency room because of persistent dizzy  spells.  These were happening fairly frequently over the last 24 hours  and would last only seconds.  She did become presyncopal at one point.  Should has not completely passed out.  In the emergency room, she has  been monitored and found to have paroxysmal atrial fibrillation flutter  and a conversion to sinus rhythm with prolonged pauses up to 3 seconds.  Sinus rhythm would only last for 5-10 seconds and then atrial  fibrillation ensure with subsequent conversion and pause again.  The  patient does not feel this irregular heart rate, although she has a long  history of palpitation.   PAST MEDICAL HISTORY:  1. Non-small-cell lung CA, status post lobar resection July of 2007.  2. PAF diagnosed August of 2007.  3. Hypertension.  4. Osteoporosis.  5. Glaucoma.  6. Previous smoker.   PAST SURGICAL HISTORY:  As above.   DRUG ALLERGIES:  None known.   MEDICATIONS:  1. Nadolol 20 mg twice daily.  2. Diltiazem ER 240 mg daily.  3. Spiriva 18  mcg inhaler daily.  4. Fosamax 70 mg p.o. q. week.  5. HCTZ 25 mg daily.  6. KCl 10 mEq p.o. daily.  7. P.R.N. zolpidem.  8. Xalatan, AlaSTAT and Restasis eye drops.   FAMILY HISTORY:  Not significant for early coronary disease.  Her father  did have the problem later in life.   SOCIAL HISTORY:  She is married, has 4 children and retired.  She quit  smoking in March of 2006 but smoked for over 40 pack years.  She does  not drink alcohol on a regular basis.   REVIEW OF SYSTEMS:  She has not had any recent weight loss or gain.  No  loss of energy.  She has not had any change in her vision or hearing.  No difficulty swallowing.  Teeth and gums are in good repair.  There is  no thyroid history.  No history of hemoptysis, although she has had a  chronic cough since a URI back in December.  She denies any  abdominal  pain, diarrhea, constipation or hematochezia.  No melena.  No hematuria,  nocturia, dysuria.  She has not had headaches, seizures or neurologic  disorder.  No psychiatric illness.  She does have minor arthritic pains  in her hands but no arthritis of the major weightbearing joints.   PHYSICAL EXAMINATION:  VITAL SIGNS:  Blood pressure is 100/55, pulse is  70 and irregularly regular, respirations are 18, temperature 96.0.  GENERAL:  This is a well-appearing, comfortable 77 year old woman,  pleasant, supine on the gurney, in no distress.  HEENT:  Unremarkable.  Head is atraumatic and normocephalic.  The pupils  are equal, round and reactive to light.  Extraocular movements are  intact.  Sclerae are anicteric.  Oral mucosa is pink and moist.  NECK:  Supple without thyromegaly or masses.  The carotid strokes are  normal.  There is no bruit.  There is no JVD.  CHEST:  Her chest has diminished breath sounds bilaterally with adequate  excursion.  No wheezes, rales or rhonchi.  HEART:  An irregular rhythm with pause.  I cannot hear a murmur, gallop  or rub.  ABDOMEN:  Soft and  nontender without midline pulsatile mass or  organomegaly.  Bowel sounds are present in all quadrants.  EXTERNAL GENITALIA:  Atrophic.  RECTAL:  Not performed.  EXTREMITIES:  Full range of motion.  No edema.  Intact distal pulses.  NEUROLOGIC:  Cranial nerves II-XII are intact.  Motor and sensory  grossly intact.  Gait not tested.  SKIN:  Warm, dry and clear.   ELECTROCARDIOGRAM:  As mentioned above.  She does have an underlying  right bundle branch block.   CHEST X-RAY:  Pending.   CURRENT LABORATORY ASSESSMENT:  Normal PT and PTT.  Negative point of  care enzymes.  Normal CBC, electrolytes, creatinine, BUN and glucose.   IMPRESSION:  1. Paroxysmal atrial fibrillation flutter with sinus node dysfunction      and subsequent symptomatic pauses up to 3 seconds.  2. Episodes of atypical angina only in that it has not occurred with      exertion.  Rule out unstable angina/acute coronary syndrome.  3. History of non-small cell lung carcinoma.  4. History of atrial fibrillation.  5. Hypertension.  6. Osteoporosis.   PLAN:  1. The patient will be admitted to telemetry monitoring, placed on an      external transthoracic pacer.  Metoprolol will be substituted for      nadolol at 12.5 mg p.o. b.i.d. x3 doses, then discontinue.  We will      also decrease the dose of Diltiazem to 120 mg p.o. daily given in      q.8h. doses of 40 mg.  2. Begin full dose Lovenox per pharmacy.  3. Serial cardiac enzymes.  4. At some point, will need an assessment for coronary artery disease,      either by catheterization or Cardiolite.  5. Will likely need permanent pacemaker insertion due to tachybrady      syndrome, symptomatic.      Francisca December, M.D.  Electronically Signed     JHE/MEDQ  D:  01/17/2007  T:  01/17/2007  Job:  [id number]   cc:   Thora Lance, M.D.

## 2011-02-01 NOTE — Discharge Summary (Signed)
[name], Glover            ACCOUNT NO.:  [id number]   MEDICAL RECORD NO.:  [id number]          PATIENT TYPE:  INP   LOCATION:  4703                         FACILITY:  [facility]   PHYSICIAN:  Francisca December, M.D.  DATE OF BIRTH:  01-07-1927   DATE OF ADMISSION:  01/17/2007  DATE OF DISCHARGE:  01/21/2007                               DISCHARGE SUMMARY   DISCHARGE DIAGNOSES:  1. Tachy/brady syndrome, status post permanent pacemaker, dual lead,      Medtronic device.  2. Nonobstructive coronary artery disease.  3. Hypertension.  4. Paroxysmal atrial fibrillation.  5. Long term Coumadin use.  6. Long term medication use.  7. Osteoporosis.  8. Glaucoma.  9. Previous smoker.   HISTORY OF PRESENT ILLNESS:  [name] is a 77 year old female who  had a 3 week history of midsternal chest pain that would last around 15  minutes and then resolved.  Approximately 4 days prior to this  admission, she had some dizzy spells lasting for seconds.  There were  palpitations associated with this event, but no other symptoms.  EKG  showed paroxysmal atrial fibrillation with long pauses/sinus node  dysfunction with pauses as long as 3 seconds.  She was admitted to the  hospital.   She does have a history of nonobstructive coronary artery disease  according to the chart.   On Jan 19, 2007, patient underwent dual lead chamber pacemaker  implantation, Medtronic device, by Dr. Corliss Marcus.  The patient  tolerated the procedure well and by Jan 21, 2007, she was ready for  discharge to home.   Please note, during the hospitalization, the patient underwent cardiac  catheterization that showed no evidence of significant coronary artery  disease.  Patient's LV EDP was elevated, consistent with systolic  hypertension.   The patient had the following lab work done while hospitalized:  White  count 7.0, hemoglobin 12.6, hematocrit 37.3, platelets 238.  CK-MB and  troponin negative.  Chest x-ray  nonacute.  EKG:  Right bundle branch  block, sinus rhythm.   DISCHARGE MEDICATIONS:  1. Diltiazem ER 120 mg a day.  2. Spiriva daily.  3. NovoLog 20 mg b.i.d.  4. Lisinopril 5 mg a day.  5. Fosamax weekly.  6. Hydrochlorothiazide 25 mg a day.  7. Xalatan eye drops 1 drop.  8. Baby aspirin daily.  9. Estat __________ one drop as needed.  10.Amiodarone 200 mg 3 times a day and this will be decreased over the      next several weeks.  11.Coumadin 5 mg as directed.  12.Darvocet 1-2 tablets q.4-6 hours p.r.n. pain.   Wound care and activity instructions were given by myself and discharge  sheet.  The patient is to remain on a low-sodium, heart-healthy diet.  Followup in the office with an EKG on Jan 28, 2007 at 9:30 a.m.  Followup with a pro time and INR on Jan 23, 2007 at 10:15 a.m. and then  follow up with the physician extender on Feb 05, 2007 at 10:30 a.m.      Guy Franco, P.A.      Francisca December,  M.D.  Electronically Signed    LB/MEDQ  D:  03/18/2007  T:  03/18/2007  Job:  [id number]   cc:   Thora Lance, M.D.

## 2011-02-01 NOTE — Op Note (Signed)
[name], [name]            ACCOUNT NO.:  [id number]   MEDICAL RECORD NO.:  [id number]          PATIENT TYPE:  INP   LOCATION:  3307                         FACILITY:  [facility]   PHYSICIAN:  Ines Bloomer, M.D. DATE OF BIRTH:  [birth year]   DATE OF PROCEDURE:  DATE OF DISCHARGE:                                 OPERATIVE REPORT   PREOPERATIVE DIAGNOSES:  1.  Nonsmall cell cancer, right lower lobe.  2.  A left middle lobe lesion.   POSTOPERATIVE DIAGNOSES:  1.  Nonsmall cell cancer, right lower lobe.  2.  Benign lesion, left middle lobe.   OPERATION PERFORMED:  Right video-assisted thoracic surgery mini-  thoracotomy, wedge resection of right middle lobe lesion, right lower lobe  superior segmentectomy with seed implantation.   SURGEON:  Dr. Ines Bloomer.   FIRST ASSISTANT:  Zadie Rhine, PA-C.   PROCEDURE:  After percutaneous insertion of all monitoring lines, the  patient underwent general anesthesia and was prepped and draped in the usual  sterile manner.  A dual lumen tube was inserted, the right lung was  deflated.  Two trocar sites were made in the anterior axillary line at the  seventh intercostal space and the midaxillary line at the eighth intercostal  space.  Two trocars were inserted.  The patient had a fairly complete  fissure.  There were adhesions to the middle lobe.  These were taken down  with electrocautery through one trocar site.  Then a small incision was made  over the sixth intercostal, a lateral incision, the latissimus being  partially divided.  This was reflected anteriorly.  Sixth intercostal space  was entered and a small retractor was inserted.  The right middle lobe  lesion was grasped and resected with the ETS-45 stapler and sent for frozen  section.  The other lesion was in the spacer portion superior segment of the  right lower lobe and dissection was started in the posterior mediastinum,  dissecting out the inferior pulmonary  ligament, a 9R and several 7 nodes.  After these had been dissected out, dissection was started in the fissure  and the superior segment of the artery.  The superior segment of the artery  was dissected out and divided with the AutoSuture 30 white Roticulator.  The  major fissure was divided with the ETS-45 stapler.  Several 11R nodes were  dissected from around the bronchus.  The patient had multiple nodes.  I  resected at least five of these nodes from around the bronchus, then the  superior segmental bronchus was stapled with the ETS-45 stapler.  We then  went across the fissure with an Exelon-60 stapler.  However, after we  stapled it, we realized that we still did not get a good enough margin on  the cancer and so we had to redo the staple line and send it down, taking  more of the lung.  The second staple line got the cancer as well as a good  margin.  FloSeal was applied to the staple line.  The seeds were then  applied to the staple line.  The first frozen  section of the middle lobe  lesion was benign.  The margin was at least one centimeter on the cancer.  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  The mesh was sutured in place with 3-0  Vicryl, placing one suture in the middle, one superiorly and inferiorly  along the staple line, and then several sutures laterally, clipping the  sutures laterally.  After that had been done, two chest tubes were brought  into the trocar sites and tied in place with 0 silk.  Marcaine block was  done in the usual fashion.  The On-Q catheter was inserted in the usual  fashion.  Two drill holes were placed through the seventh rib and two  pericostals  were placed around the sixth rib going through the drill holes and the  muscles were closed with #1 Vicryl, 2-0 Vicryl in the subcutaneous tissues,  and Dermabond to the skin.  The patient returned to the recovery room in  stable condition.           ______________________________  Ines Bloomer,  M.D.     DPB/MEDQ  D:  04/08/2006  T:  04/09/2006  Job:  [id number]

## 2011-02-01 NOTE — Op Note (Signed)
[name], [name]            ACCOUNT NO.:  [id number]   MEDICAL RECORD NO.:  [id number]          PATIENT TYPE:  INP   LOCATION:  2807                         FACILITY:  [facility]   PHYSICIAN:  Francisca December, M.D.  DATE OF BIRTH:  Dec 20, 1926   DATE OF PROCEDURE:  01/19/2007  DATE OF DISCHARGE:                               OPERATIVE REPORT   PROCEDURES PERFORMED:  1. Left subclavian venogram.  2. Insertion of dual-chamber permanent transvenous pacemaker.   INDICATION:  [name] is a 77 year old woman with paroxysmal  atrial fibrillation and tachybrady syndrome.  She presented to [facility] emergency room with episodes of severe lightheadedness.  She  demonstrated on telemetry paroxysmal atrial fibrillation with conversion  to sinus rhythm and up to 4-second pauses.  While being monitored in the  past 36 hours she has converted to sinus rhythm  maintained in about 60  beats per minute.  She is brought to the catheterization laboratory at  this time for insertion of a dual-chamber permanent transvenous  pacemaker.  This is with the diagnosis of tachybrady syndrome and sick  sinus syndrome.   PROCEDURAL NOTE:  The patient was brought to the cardiac catheterization  laboratory in the fasting state.  The left prepectoral region was  prepped and draped in the usual sterile fashion.  Local anesthesia was  obtained with infiltration of 1% lidocaine with epinephrine throughout  the left prepectoral region.  A left subclavian venogram was then  performed with the peripheral injection of 20 mL of Omnipaque.  A distal  cine angiogram was obtained and road mapped to guide future left  subclavian puncture.  The venogram did demonstrate the vein to be widely  patent and coursing in a normal fashion over the anterior surface of the  first rib and beneath the middle third of the clavicle.  There was no  evidence for persistence of the left superior vena cava.   A 6- to 7-cm incision  was then made in the deltopectoral groove and this  was carried down by sharp dissection and electrocautery to the  prepectoral fascia.  There a pocket was formed inferiorly and medially  which was then packed with 1% kanamycin-soaked gauze.  The left  subclavian vein was then punctured twice using an 18-gauge thin-wall  needle.  Two 0.038 J-wires were placed.  A 7-French tearaway sheath and  dilator were advanced over one wire.  The dilator and wire were removed,  and a ventricular lead was advanced to the level of the right atrium.  The sheath was torn away.  Using standard technique and fluoroscopic  landmarks, the lead was manipulated onto the right ventricular septum.  This was an active fixation lead and the screw was advanced as  appropriate.  It was tested for adequate pacing parameters and these are  reported below.  It was also tested for diaphragmatic pacing at 10 volts  and none was found.  The lead was then sutured into place using three  separate 0 silk ligatures.  Over the remaining guidewire a second 7  French tearaway sheath and dilator were advanced.  Dilator and wire  removed and the atrial lead was advanced to the level of the right  atrium and  the sheath was torn away.  Using standard technique and  fluoroscopic landmarks, the lead was manipulated onto the right medial  wall of the right atrium.  This also was an active fixation lead and the  screw was advanced as appropriate.  It was tested for adequate pacing  parameters, and these are reported below.  It was also tested for  diaphragmatic pacing at 10 volts and none was found.  The lead was then  sutured into place using three separate 0 silk ligatures.  The kanamycin-  soaked gauze was then removed from the pocket, and the pocket was  copiously irrigated using 1% kanamycin solution.  The leads were then  attached to the pacing generator, carefully identifying each by its  serial number and placing each into the  appropriate receptacle.  Each  lead was tightened into place and tested for security.  The leads were  then wound beneath the pacing generator, and the generator was placed in  the pocket.  An 0 silk anchoring suture was applied to the pectoralis  muscle.  The wound was then closed using 2-0 Vicryl in a running fashion  for the subcutaneous layer.  The skin was approximated using 4-0 Vicryl  in a running subcuticular fashion.  Steri-Strips and a sterile dressing  were applied, and the patient was transported to the recovery area in  stable condition.   EQUIPMENT DATA:  The pacing generator is a Medtronic EnRhythm model  D1939726, serial [id number].  The atrial lead is a Medtronic model  [id number], serial # [id number].  The ventricular lead is Medtronic model  [id number], serial [id number].   PACING DATA:  The ventricular lead detected a 11.4 millivolt R-wave.  The pacing threshold was 0.7 volts at 0.5 millisecond pulse width.  The  impedance was 738 ohms resulting in a current at capture threshold of  1.1 MA.  The current of injury amplitude was 5 millivolts.  The atrial  lead detected a 2.4 millivolt P-wave.  The pacing threshold was 1 volt  at 0.5 milliseconds pulse width.  The impedance was 542 ohms resulting  in a current at capture threshold of 2.2 MA.      Francisca December, M.D.  Electronically Signed     JHE/MEDQ  D:  01/19/2007  T:  01/19/2007  Job:  [id number]   cc:   Thora Lance, M.D.

## 2011-02-01 NOTE — Discharge Summary (Signed)
[name], [name]            ACCOUNT NO.:  [id number]   MEDICAL RECORD NO.:  [id number]          PATIENT TYPE:  INP   LOCATION:  3307                         FACILITY:  [facility]   PHYSICIAN:  Ines Bloomer, M.D. DATE OF BIRTH:  [birth year]   DATE OF ADMISSION:  04/08/2006  DATE OF DISCHARGE:  04/14/2006                                 DISCHARGE SUMMARY   ADMISSION DIAGNOSIS:  Right middle-lobe lung mass.   DISCHARGE DIAGNOSES:  1. Non-small-cell lung cancer.  2. Right lower lobe mass status post right video-assisted thoracic surgery      (VATS) plus wedge resection.  3. Hypertension.  4. Osteoporosis.  5. Glaucoma.  6. Former heavy tobacco user.   CONSULTS:  None.   PROCEDURES:  On April 08, 2006, the patient underwent a right video-assisted  thoracic surgery mini-thoracotomy, wedge resection of the right middle lobe  lesion, right lower lobe __________ by Dr. Ines Bloomer.   HISTORY AND PHYSICAL:  This is a 77 year old patient who was worked up for  hematuria.  She had a CT scan which showed a questionable millimeter lung  mass and she underwent a full CT scan which showed a 1 cm nodular area in  the medial segment of the right middle lobe and a questionable right lower  lobe lesion.  There was also some changes with scarring in the left lingular  area and the apical area, so she was followed by progressive CT scans.  This  was first noticed in May of 2006.  Progressive CT scans did not change.  The  right middle-lobe lesion remained the same at 12 mm and the right lower lobe  is the same.  However, on final CT scan in June of 29528, the right middle  lobe had increased 9 mm in size.  Patient underwent needle biopsy which  showed non-small-cell lung cancer.  Her pulmonary function tests  predominantly showed FVC of 1.76 with an FEV1 of 1.12 which was 94% of  predicted.  Her diffusion capacity was 62% of predicted.  Patient was seen  my Dr. Margaretmary Dys for  possible __________.  She had no fever or chills  or excessive sputum.  Patient quit smoking in March of 2006, but had a long  history of smoking previous to that.  It was best thought the patient  undergo resection of the right lower-lobe lesion and middle-lobe lesion with  seed implantation.  The risks and benefits were explained to the patient in  great detailed and she agreed to proceed.   HOSPITAL COURSE:  Postoperatively, the patient did well and progressed as  expected.  Patient's surgery went without complications.  On postop day  number one, the patient had a 1/7 air leak.  Her vitals had remained stable  and she had remained afebrile.  Postop day number two, the patient's  posterior chest tube was D/C'd.  Status post chest tube removal, the patient  had increase in hydropneumothorax.  Patient's chest x-ray had remained  stable after that.  Patient has some postop hypokalemia, she was replaced  with potassium chloride and her potassium had  increased appropriately.   Patient's discharge was held for one to two days because on postop day  number four, the patient went into atrial fibrillation.  She was currently  in normal sinus rhythm after that and slightly tachycardic on the monitor.  The patient was started on Lopressor.  Patient did stay in normal sinus  rhythm the remainder of her stay.  On physical exam at discharge showed temp  98.3, blood pressure 135/58, heart rate 65, O2 sat 96% on room air,  respirations 16.  Cardiovascular regular rate and rhythm.  Respirations acute breath sounds at  the bases.  Abdominal exam benign.  Extremities no edema.  The patient's  labs were within normal limits.  Patient was discharged home on April 14, 2006.  The patient's postop medications, discharge instructions and  appointment with Dr. Edwyna Shell have remained the same.      Constance Holster, PA    ______________________________  Ines Bloomer, M.D.    JMW/MEDQ  D:  05/06/2006   T:  05/06/2006  Job:  [id number]

## 2011-02-01 NOTE — Discharge Summary (Signed)
NAMESHAUNIECE, KWAN            ACCOUNT NO.:  [id number]   MEDICAL RECORD NO.:  [id number]          PATIENT TYPE:  INP   LOCATION:  3307                         FACILITY:  [facility]   PHYSICIAN:  Ines Bloomer, M.D. DATE OF BIRTH:  11/16/1926   DATE OF ADMISSION:  04/08/2006  DATE OF DISCHARGE:  04/13/2006                                 DISCHARGE SUMMARY   PRIMARY ADMISSION DIAGNOSIS:  Right lower leg of the lung mass.   ADDITIONAL/DISCHARGE DIAGNOSES:  1.  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  2.  Bronchiectasis of right middle lobe with chronic inflammation.  3.  Hypertension.  4.  Osteoporosis.  5.  Glaucoma.   PROCEDURE PERFORMED:  1.  Right VATS.  2.  Right mini thoracotomy with right middle lobe wedge resection.  3.  Right lower lobe superior segmentectomy and radioactive seed      implantation.   HISTORY:  The patient is a 77 year old female who was undergoing workup for  hematuria.  She underwent a CT scan which showed a 1-cm nodular area in the  medial segment of the right middle leg and a questionable right lower lobe  lesion.  This was in May 2006.  She has undergone progressive CT scans since  that time and on her most recent scan in June 2007 a right lower lobe lesion  had increased to 9 mm in size.  She underwent a needle biopsy which showed  non-small cell cancer.  She was referred to Dr. Dewayne Shorter for further  evaluation and it was his opinion that she should undergo a right VATS  resection at this time.  She also saw Dr. Margaretmary Dys preoperatively for  possible seed implantation.   HOSPITAL COURSE:  She was admitted to [facility] on March 09, 2006,  was taken to the operating room where she underwent a right middle lobe  wedge resection and a right lower lobe superior segmentectomy with seed  implantation.  A final pathology showed bronchiectasis of the right middle  leg with acute and chronic inflammation, emphysematous changes  and focal  interstitial fibrosis.  The right lower lobe lesion was positive for  bronchoalveolar adenocarcinoma, non-mucinous, associated with sclerosis.  This was staged at T1, N0.  Postoperatively, she has done very well.  She  has remained afebrile, and all vital signs have been stable.  Her chest  tubes have been removed and presently her chest x-ray showed a small right  hydropneumothorax.  She has been hypokalemic and currently is receiving  supplemental potassium.  Her incisions are all healing well.  She has been  ambulating in the halls without problem.  She is tolerating a regular diet  and is having normal bowel and bladder function.  Her most recent labs  showed hemoglobin of 12, hematocrit 34.4, platelets 296,000, white count  13.5, sodium 130, potassium 2.8 which is currently being supplemented, BUN  4, creatinine 0.5.  She will have follow-up chest x-ray on the morning of  January 11, 2006.  It is anticipated if she continues to remain stable over  the next 24-48  hours and her chest tube shows improvement she will be ready  for discharge home.   Discharge medications are as follows:  1.  Tylox one to two q.4h. p.r.n. for pain.  2.  Nadolol 20 mg b.i.d.  3.  Potassium 10 mEq daily.  4.  Fosamax 70 mg weekly.  5.  Hydrochlorothiazide 25 mg daily.  6.  Lotrel 5/10 daily.  7.  Xalatan drops nightly.  8.  Restasis drops twice daily.  9.  Elestat drops twice daily.  10. Aspirin 81 mg daily.  11. Multivitamin daily.  12. Calcium as directed.  13. Fish oil as directed.   DISCHARGE INSTRUCTIONS:  She is asked to refrain from driving, heavy  lifting, or strenuous activity.  She may continue ambulating daily and using  her incentive spirometer.  She may shower daily and clean her incisions with  soap and water.   DISCHARGE FOLLOWUP:  She will see Dr. Edwyna Shell back in the office in one week  with a chest x-ray from [facility].  She will contact our  office in the  interim if she experiences any problems or has questions.      Coral Ceo, P.A.    ______________________________  Ines Bloomer, M.D.    GC/MEDQ  D:  04/11/2006  T:  04/11/2006  Job:  [id number]   cc:   Thora Lance, M.D.  Fax: [phone]   Artist Pais. Kathrynn Running, M.D.  Fax: [phone]

## 2011-02-01 NOTE — Discharge Summary (Signed)
NAMEGIAH, [name]            ACCOUNT NO.:  [id number]   MEDICAL RECORD NO.:  [id number]          PATIENT TYPE:  INP   LOCATION:  3307                         FACILITY:  [facility]   PHYSICIAN:  Ines Bloomer, M.D. DATE OF BIRTH:  14-May-1927   DATE OF ADMISSION:  04/08/2006  DATE OF DISCHARGE:  04/14/2006                                 DISCHARGE SUMMARY   ADDENDUM   DISCHARGE SUMMARY:  [id number].   Dictation ends at this point.      Constance Holster, PA    ______________________________  Ines Bloomer, M.D.    JMW/MEDQ  D:  05/06/2006  T:  05/06/2006  Job:  [id number]

## 2011-02-01 NOTE — Cardiovascular Report (Signed)
[name], [name]            ACCOUNT NO.:  [id number]   MEDICAL RECORD NO.:  [id number]          PATIENT TYPE:  INP   LOCATION:  4703                         FACILITY:  [facility]   PHYSICIAN:  Francisca December, M.D.  DATE OF BIRTH:  [birth year]   DATE OF PROCEDURE:  01/20/2007  DATE OF DISCHARGE:                            CARDIAC CATHETERIZATION   PROCEDURE PERFORMED:  1. Left heart catheterization.  2. Left ventriculogram.  3. Coronary angiography.   INDICATIONS:  [name] is a 77 year old woman who has presented  recently with paroxysmal atrial fibrillation with symptomatic pause up  to 4 seconds and near-syncope.  She has undergone permanent pacemaker  placement and is now on amiodarone.  Because of previous history of  chest pains which sounded quite anginal in nature and her arrhythmia,  she is brought to the catheterization laboratory at this time to  identify possible CAD as an etiology and provide further therapeutic  options.   PROCEDURE NOTE:  The patient is brought to the cardiac catheterization  laboratory in a fasting state.  The right groin was prepped and draped  in the usual sterile fashion.  Local anesthesia was obtained with  infiltration of 1% lidocaine.  A 6-French catheter sheath was inserted  percutaneously into the right femoral artery utilizing an anterior  approach over guiding J-wire.  Left heart catheterization and coronary  angiography then proceeded in the standard fashion using 6-French #5  left Judkins catheter and a 6-French #4 right Judkins catheter.  A 110  cm pigtail catheter was used to perform a left ventriculogram which was  obtained in the 30 degrees RAO angulation.  39 mL were injected at 13 mL  per second using a power injector.  All catheter manipulations were  performed using fluoroscopic observation and exchanges performed over a  long guiding J-wire.  At completion of procedure the catheter and  catheter sheath were removed.   Hemostasis was achieved by direct  pressure.  The patient was transported to the recovery area in stable  condition with an intact distal pulse.   HEMODYNAMIC RESULTS:  Systemic arterial pressure was 159/71 with a mean  of 106 mmHg.  There was no systolic gradient across the aortic valve.  The left ventricular end-diastolic pressure was 12 mmHg pre-  ventriculogram.   Angiography.  The left ventriculogram demonstrated normal chamber size  and normal global systolic function without regional wall motion  abnormality.  A visual estimate of the ejection fraction is 70-75%.  There is minimal coronary calcification seen.  The aortic valve is  trileaflet and opens normally during systole.  There is no mitral  regurgitation.   There is a right-dominant coronary system present.  The main left  coronary is short and normal.   The left anterior descending artery and its branches demonstrate some  luminal irregularities, especially within the midportion of the vessel  after the origin of a large second septal perforator.  No significant  obstructions are present.  First diagonal and a moderate-sized second  diagonal arise.  There is no obstructions in these branch vessels.  The  ongoing anterior descending  artery reaches and barely traverses the  apex.   The left circumflex coronary is relatively small.  It does give rise to  a very proximal first marginal which is moderate in size and a second  marginal which is moderate-to-large in size.  The ongoing circumflex is  diminutive in the AV groove and gives no further branches.  No  obstructions are seen within the left circumflex system.   The right coronary artery is large and dominant.  There is a 20% tubular  narrowing in the distal portion.  There are no narrowings whatsoever in  the proximal or midportions.  It gives rise distally to a large  posterior descending artery which reaches the apex.  The posterolateral  segment is also large  and gives rise to three moderate-to-large size  left ventricular branches.   Collateral vessels are not seen.   FINAL IMPRESSION:  1. Systemic hypertension.  2. Intact left ventricular size and global systolic function, ejection      fraction 75%.  3. No significant obstructive coronary disease.   PLAN:  The patient should be started on an additional antiarrhythmic  given her hypertension.  She is already on a calcium channel blocker and  a beta blocker.  The calcium channel blocker was stopped when she  presented with her symptomatic pauses.  The beta blocker has been  maintained.  I will discharge on either a ACE or ARB.      Francisca December, M.D.  Electronically Signed     JHE/MEDQ  D:  01/20/2007  T:  01/21/2007  Job:  [id number]   cc:   Thora Lance, M.D.

## 2011-04-11 ENCOUNTER — Encounter (HOSPITAL_COMMUNITY): Payer: Self-pay

## 2011-04-11 ENCOUNTER — Encounter (HOSPITAL_BASED_OUTPATIENT_CLINIC_OR_DEPARTMENT_OTHER): Payer: Medicare Other | Admitting: Internal Medicine

## 2011-04-11 ENCOUNTER — Other Ambulatory Visit: Payer: Self-pay | Admitting: Internal Medicine

## 2011-04-11 ENCOUNTER — Ambulatory Visit (HOSPITAL_COMMUNITY)
Admission: RE | Admit: 2011-04-11 | Discharge: 2011-04-11 | Disposition: A | Discharge status: Home / Self Care | Payer: Medicare Other | Source: Ambulatory Visit | Attending: Internal Medicine | Admitting: Internal Medicine

## 2011-04-11 DIAGNOSIS — E042 Nontoxic multinodular goiter: Secondary | ICD-10-CM | POA: Insufficient documentation

## 2011-04-11 DIAGNOSIS — C3490 Malignant neoplasm of unspecified part of unspecified bronchus or lung: Secondary | ICD-10-CM | POA: Insufficient documentation

## 2011-04-11 DIAGNOSIS — E279 Disorder of adrenal gland, unspecified: Secondary | ICD-10-CM | POA: Insufficient documentation

## 2011-04-11 DIAGNOSIS — I700 Atherosclerosis of aorta: Secondary | ICD-10-CM | POA: Insufficient documentation

## 2011-04-11 DIAGNOSIS — C3430 Malignant neoplasm of lower lobe, unspecified bronchus or lung: Secondary | ICD-10-CM

## 2011-04-11 DIAGNOSIS — J984 Other disorders of lung: Secondary | ICD-10-CM | POA: Insufficient documentation

## 2011-04-11 DIAGNOSIS — Z950 Presence of cardiac pacemaker: Secondary | ICD-10-CM | POA: Insufficient documentation

## 2011-04-11 DIAGNOSIS — E876 Hypokalemia: Secondary | ICD-10-CM

## 2011-04-11 LAB — CMP (CANCER CENTER ONLY)
ALT(SGPT): 26 U/L (ref 10–47)
Alkaline Phosphatase: 59 U/L (ref 26–84)
CO2: 31 mEq/L (ref 18–33)
Creat: 0.9 mg/dl (ref 0.6–1.2)
Total Bilirubin: 0.7 mg/dl (ref 0.20–1.60)

## 2011-04-11 LAB — CBC WITH DIFFERENTIAL/PLATELET
BASO%: 0.4 % (ref 0.0–2.0)
HCT: 39 % (ref 34.8–46.6)
LYMPH%: 18.6 % (ref 14.0–49.7)
MCH: 31.8 pg (ref 25.1–34.0)
MCHC: 33.7 g/dL (ref 31.5–36.0)
MCV: 94.4 fL (ref 79.5–101.0)
MONO#: 0.7 10*3/uL (ref 0.1–0.9)
MONO%: 11.8 % (ref 0.0–14.0)
NEUT%: 66.1 % (ref 38.4–76.8)
Platelets: 291 10*3/uL (ref 145–400)
WBC: 6.1 10*3/uL (ref 3.9–10.3)

## 2011-04-11 MED ORDER — IOHEXOL 300 MG/ML  SOLN
80.0000 mL | Freq: Once | INTRAMUSCULAR | Status: AC | PRN
  Administered 2011-04-11: 80 mL via INTRAVENOUS

## 2011-04-17 ENCOUNTER — Encounter (HOSPITAL_BASED_OUTPATIENT_CLINIC_OR_DEPARTMENT_OTHER): Payer: Medicare Other | Admitting: Internal Medicine

## 2011-04-17 DIAGNOSIS — C3430 Malignant neoplasm of lower lobe, unspecified bronchus or lung: Secondary | ICD-10-CM

## 2011-05-16 ENCOUNTER — Encounter: Payer: Self-pay | Admitting: Internal Medicine

## 2011-05-21 ENCOUNTER — Ambulatory Visit (INDEPENDENT_AMBULATORY_CARE_PROVIDER_SITE_OTHER): Payer: Medicare Other | Admitting: Pulmonary Disease

## 2011-05-21 ENCOUNTER — Encounter: Payer: Self-pay | Admitting: Pulmonary Disease

## 2011-05-21 DIAGNOSIS — J4489 Other specified chronic obstructive pulmonary disease: Secondary | ICD-10-CM

## 2011-05-21 DIAGNOSIS — J449 Chronic obstructive pulmonary disease, unspecified: Secondary | ICD-10-CM

## 2011-05-21 DIAGNOSIS — J479 Bronchiectasis, uncomplicated: Secondary | ICD-10-CM

## 2011-05-21 NOTE — Assessment & Plan Note (Signed)
The patient has known COPD most likely related to her bronchiectasis, however has not wanted to stay on a maintenance regimen.  It is also been difficult to get her to even use her rescue inhaler.  I have asked her to try and keep this handy, especially when she is away from home and doing exertional activities.

## 2011-05-21 NOTE — Patient Instructions (Signed)
Continue to stay as active as possible Use your rescue inhaler if having breathing issues followup with me in 6mos.

## 2011-05-21 NOTE — Progress Notes (Signed)
  Subjective:    Patient ID: [name], female    DOB: 1926/10/24, 77 y.o.   MRN: [id number]  HPI The patient comes in today for followup of her known bronchiectasis.  She also has underlying COPD, but has not wanted to stay on a maintenance bronchodilator regimen.  She has not had any recent flare of her bronchiectasis, or chest cold.  She does occasionally have shortness of breath and wheezing, but has not been using her rescue inhaler.  I have counseled her on this.  She has not had any recent hemoptysis.   Review of Systems  Constitutional: Negative for fever and unexpected weight change.  HENT: Positive for dental problem. Negative for ear pain, nosebleeds, congestion, sore throat, rhinorrhea, sneezing, trouble swallowing, postnasal drip and sinus pressure.   Eyes: Positive for itching. Negative for redness.  Respiratory: Positive for cough, chest tightness and wheezing. Negative for shortness of breath.   Cardiovascular: Positive for leg swelling. Negative for palpitations.  Gastrointestinal: Positive for nausea. Negative for vomiting.  Genitourinary: Negative for dysuria.  Musculoskeletal: Negative for joint swelling.  Skin: Negative for rash.  Neurological: Negative for headaches.  Hematological: Bruises/bleeds easily.  Psychiatric/Behavioral: Negative for dysphoric mood. The patient is not nervous/anxious.        Objective:   Physical Exam Well-developed female in no acute distress Nose without purulence or discharge noted Chest with a few scattered crackles, no wheezes or rhonchi Cardiac exam with regular rate and rhythm Lower extremities with no significant edema, varicosities noted, no cyanosis Alert and oriented, moves all 4 extremities.       Assessment & Plan:

## 2011-05-21 NOTE — Assessment & Plan Note (Signed)
The patient has known bronchiectasis, but has not had an acute exacerbation since her last visit.  She has very little cough or purulent mucus.

## 2011-06-28 LAB — FUNGUS CULTURE W SMEAR: Fungal Smear: NONE SEEN

## 2011-06-28 LAB — CULTURE, RESPIRATORY W GRAM STAIN

## 2011-06-28 LAB — AFB CULTURE WITH SMEAR (NOT AT ARMC)

## 2011-09-20 DIAGNOSIS — L719 Rosacea, unspecified: Secondary | ICD-10-CM | POA: Diagnosis not present

## 2011-09-20 DIAGNOSIS — H04129 Dry eye syndrome of unspecified lacrimal gland: Secondary | ICD-10-CM | POA: Diagnosis not present

## 2011-09-20 DIAGNOSIS — H538 Other visual disturbances: Secondary | ICD-10-CM | POA: Diagnosis not present

## 2011-09-20 DIAGNOSIS — H4011X0 Primary open-angle glaucoma, stage unspecified: Secondary | ICD-10-CM | POA: Diagnosis not present

## 2011-09-23 DIAGNOSIS — I495 Sick sinus syndrome: Secondary | ICD-10-CM | POA: Diagnosis not present

## 2011-09-23 DIAGNOSIS — Z950 Presence of cardiac pacemaker: Secondary | ICD-10-CM | POA: Diagnosis not present

## 2011-10-08 DIAGNOSIS — G8929 Other chronic pain: Secondary | ICD-10-CM | POA: Diagnosis not present

## 2011-10-08 DIAGNOSIS — I10 Essential (primary) hypertension: Secondary | ICD-10-CM | POA: Diagnosis not present

## 2011-10-08 DIAGNOSIS — K219 Gastro-esophageal reflux disease without esophagitis: Secondary | ICD-10-CM | POA: Diagnosis not present

## 2011-10-08 DIAGNOSIS — F411 Generalized anxiety disorder: Secondary | ICD-10-CM | POA: Diagnosis not present

## 2011-10-24 ENCOUNTER — Other Ambulatory Visit: Payer: Self-pay | Admitting: Internal Medicine

## 2011-10-24 DIAGNOSIS — C3490 Malignant neoplasm of unspecified part of unspecified bronchus or lung: Secondary | ICD-10-CM

## 2011-11-19 ENCOUNTER — Encounter: Payer: Self-pay | Admitting: Pulmonary Disease

## 2011-11-19 ENCOUNTER — Ambulatory Visit (INDEPENDENT_AMBULATORY_CARE_PROVIDER_SITE_OTHER): Payer: Medicare Other | Admitting: Pulmonary Disease

## 2011-11-19 DIAGNOSIS — J479 Bronchiectasis, uncomplicated: Secondary | ICD-10-CM | POA: Diagnosis not present

## 2011-11-19 DIAGNOSIS — J4489 Other specified chronic obstructive pulmonary disease: Secondary | ICD-10-CM

## 2011-11-19 DIAGNOSIS — J449 Chronic obstructive pulmonary disease, unspecified: Secondary | ICD-10-CM | POA: Diagnosis not present

## 2011-11-19 MED ORDER — BUDESONIDE-FORMOTEROL FUMARATE 160-4.5 MCG/ACT IN AERO: 2.0000 | INHALATION_SPRAY | Freq: Two times a day (BID) | RESPIRATORY_TRACT | Status: DC

## 2011-11-19 NOTE — Patient Instructions (Signed)
Would stay on symbicort 2 puffs in am and pm.  Will call in a prescription for you Can use albuterol for rescue if needed 2 puffs every 6hrs If you feel you are not getting better, and certainly if worsening, please call us. If doing better, followup with me in 6mos.

## 2011-11-19 NOTE — Progress Notes (Signed)
  Subjective:    Patient ID: [name], female    DOB: 02-28-1927, 78 y.o.   MRN: [id number]  HPI The patient comes in today for followup of her bronchiectasis with known COPD.  She has been hesitant to stay on any type of bronchodilator regimen, but recently began to have increased chest congestion with cough productive of discolored mucus.  She found an old sampled symbicort and started on this, and states that her cough has improved.  She has also had decreased quantity of mucous, but persistent chest congestion.  She has not seen significant increase in shortness of breath.   Review of Systems  Constitutional: Negative for fever and unexpected weight change.  HENT: Negative for ear pain, nosebleeds, congestion, sore throat, rhinorrhea, sneezing, trouble swallowing, dental problem, postnasal drip and sinus pressure.   Eyes: Positive for redness and itching.  Respiratory: Positive for cough, chest tightness, shortness of breath and wheezing.   Cardiovascular: Positive for leg swelling. Negative for palpitations.  Gastrointestinal: Positive for nausea. Negative for vomiting.  Genitourinary: Negative for dysuria.  Musculoskeletal: Negative for joint swelling.  Skin: Negative for rash.  Neurological: Negative for headaches.  Hematological: Does not bruise/bleed easily.  Psychiatric/Behavioral: Negative for dysphoric mood. The patient is not nervous/anxious.        Objective:   Physical Exam Well-developed female in no acute distress Nose without purulence or discharge noted Chest with a few inspiratory squeaks, no significant crackles or wheezes Cardiac exam with regular rate and rhythm Lower extremities without significant edema, no cyanosis Alert and oriented, moves all 4 extremities.       Assessment & Plan:

## 2011-11-19 NOTE — Assessment & Plan Note (Signed)
It is unclear of the patient's recent increased symptoms were secondary to a mild bronchiectasis flare, or more related to COPD.  She does not have significant quantity of mucus currently, and therefore would like to hold off on antibiotics.

## 2011-11-19 NOTE — Assessment & Plan Note (Signed)
The patient has been hesitant to stay on any type of bronchodilator regimen, but has seen improvement with just 10 days symbicort use.  I have asked her to stay on this consistently to see if her symptoms totally resolved.  If they do not, I would consider a short course of antibiotics and a prednisone taper.

## 2011-11-27 DIAGNOSIS — I4891 Unspecified atrial fibrillation: Secondary | ICD-10-CM | POA: Diagnosis not present

## 2011-11-27 DIAGNOSIS — Z950 Presence of cardiac pacemaker: Secondary | ICD-10-CM | POA: Diagnosis not present

## 2011-12-10 DIAGNOSIS — E876 Hypokalemia: Secondary | ICD-10-CM | POA: Diagnosis not present

## 2012-01-15 DIAGNOSIS — Z950 Presence of cardiac pacemaker: Secondary | ICD-10-CM | POA: Diagnosis not present

## 2012-01-15 LAB — PACEMAKER DEVICE OBSERVATION

## 2012-01-16 DIAGNOSIS — H409 Unspecified glaucoma: Secondary | ICD-10-CM | POA: Diagnosis not present

## 2012-01-16 DIAGNOSIS — H04129 Dry eye syndrome of unspecified lacrimal gland: Secondary | ICD-10-CM | POA: Diagnosis not present

## 2012-01-16 DIAGNOSIS — H4011X0 Primary open-angle glaucoma, stage unspecified: Secondary | ICD-10-CM | POA: Diagnosis not present

## 2012-03-30 ENCOUNTER — Ambulatory Visit: Payer: Medicare Other

## 2012-03-30 DIAGNOSIS — S8010XA Contusion of unspecified lower leg, initial encounter: Secondary | ICD-10-CM | POA: Diagnosis not present

## 2012-04-08 ENCOUNTER — Other Ambulatory Visit: Payer: Medicare Other | Admitting: Lab

## 2012-04-08 ENCOUNTER — Ambulatory Visit (HOSPITAL_COMMUNITY)
Admission: RE | Admit: 2012-04-08 | Discharge: 2012-04-08 | Disposition: A | Discharge status: Home / Self Care | Payer: Medicare Other | Source: Ambulatory Visit | Attending: Internal Medicine | Admitting: Internal Medicine

## 2012-04-08 ENCOUNTER — Other Ambulatory Visit (HOSPITAL_BASED_OUTPATIENT_CLINIC_OR_DEPARTMENT_OTHER): Payer: Medicare Other | Admitting: Lab

## 2012-04-08 DIAGNOSIS — I77812 Thoracoabdominal aortic ectasia: Secondary | ICD-10-CM | POA: Insufficient documentation

## 2012-04-08 DIAGNOSIS — C3490 Malignant neoplasm of unspecified part of unspecified bronchus or lung: Secondary | ICD-10-CM | POA: Insufficient documentation

## 2012-04-08 DIAGNOSIS — E041 Nontoxic single thyroid nodule: Secondary | ICD-10-CM | POA: Insufficient documentation

## 2012-04-08 DIAGNOSIS — C3430 Malignant neoplasm of lower lobe, unspecified bronchus or lung: Secondary | ICD-10-CM

## 2012-04-08 DIAGNOSIS — Q619 Cystic kidney disease, unspecified: Secondary | ICD-10-CM | POA: Insufficient documentation

## 2012-04-08 DIAGNOSIS — K7689 Other specified diseases of liver: Secondary | ICD-10-CM | POA: Diagnosis not present

## 2012-04-08 DIAGNOSIS — E279 Disorder of adrenal gland, unspecified: Secondary | ICD-10-CM | POA: Diagnosis not present

## 2012-04-08 LAB — CMP (CANCER CENTER ONLY)
ALT(SGPT): 35 U/L (ref 10–47)
Alkaline Phosphatase: 75 U/L (ref 26–84)
Creat: 1.4 mg/dl — ABNORMAL HIGH (ref 0.6–1.2)
Sodium: 138 mEq/L (ref 128–145)
Total Bilirubin: 0.8 mg/dl (ref 0.20–1.60)
Total Protein: 7.7 g/dL (ref 6.4–8.1)

## 2012-04-08 LAB — CBC WITH DIFFERENTIAL/PLATELET
Eosinophils Absolute: 0.1 10*3/uL (ref 0.0–0.5)
HCT: 45.7 % (ref 34.8–46.6)
LYMPH%: 20 % (ref 14.0–49.7)
MCHC: 33.1 g/dL (ref 31.5–36.0)
MCV: 92.6 fL (ref 79.5–101.0)
MONO%: 9.5 % (ref 0.0–14.0)
NEUT#: 4.1 10*3/uL (ref 1.5–6.5)
NEUT%: 67.5 % (ref 38.4–76.8)
Platelets: 330 10*3/uL (ref 145–400)
RBC: 4.93 10*6/uL (ref 3.70–5.45)

## 2012-04-15 ENCOUNTER — Other Ambulatory Visit: Payer: Medicare Other | Admitting: Lab

## 2012-04-15 ENCOUNTER — Ambulatory Visit (HOSPITAL_BASED_OUTPATIENT_CLINIC_OR_DEPARTMENT_OTHER): Payer: Medicare Other | Admitting: Internal Medicine

## 2012-04-15 ENCOUNTER — Ambulatory Visit: Payer: Medicare Other | Admitting: Internal Medicine

## 2012-04-15 VITALS — BP 141/74 | HR 66 | Temp 97.1°F | Ht 60.0 in | Wt 111.5 lb

## 2012-04-15 DIAGNOSIS — C3430 Malignant neoplasm of lower lobe, unspecified bronchus or lung: Secondary | ICD-10-CM

## 2012-04-15 DIAGNOSIS — C3490 Malignant neoplasm of unspecified part of unspecified bronchus or lung: Secondary | ICD-10-CM

## 2012-04-15 DIAGNOSIS — I10 Essential (primary) hypertension: Secondary | ICD-10-CM | POA: Diagnosis not present

## 2012-04-15 DIAGNOSIS — G8929 Other chronic pain: Secondary | ICD-10-CM | POA: Diagnosis not present

## 2012-04-15 DIAGNOSIS — M810 Age-related osteoporosis without current pathological fracture: Secondary | ICD-10-CM | POA: Diagnosis not present

## 2012-04-15 NOTE — Progress Notes (Signed)
[facility] Telephone:([phone]) [phone]   Fax:([phone]) [phone]  OFFICE PROGRESS NOTE  Lillia Mountain, MD [address], [address] [facility], [place]. [place] [address]  DIAGNOSIS: Stage IA (T1a., N0, MX) non-small cell lung cancer, adenocarcinoma with bronchoalveolar features diagnosed in February of 2007.  PRIOR THERAPY: Status post wedge resection of the right middle lobe and lower lobe superior segment with seed implants under the care of Dr. Edwyna Shell on 04/05/2006.  CURRENT THERAPY: Observation.  INTERVAL HISTORY: LANAYSIA FRITCHMAN 78 y.o. female returns to the clinic today for annual followup visit. The patient is doing fine with no specific complaints today. She denied having any significant chest pain or shortness breath, no cough or hemoptysis. She denied having any significant weight loss or night sweats. The patient has repeat CT scan of the chest performed recently and she is here today for evaluation and discussion of her scan results.  MEDICAL HISTORY: Past Medical History  Diagnosis Date  . Lung cancer 2007    hx stage 1 BAC/ RML. s/p wedge resection from RML, superior segment RLL resection s/p seed implants  . Mycobacterial disease   . Atrial fibrillation   . Bronchiectasis     bronch 10/2009 w/ stenotrophomonas sens to levaquin and bactrim    ALLERGIES:   has no known allergies.  MEDICATIONS:  Current Outpatient Prescriptions  Medication Sig Dispense Refill  . amiodarone (PACERONE) 200 MG tablet Take 200 mg by mouth daily.        Marland Kitchen amLODipine (NORVASC) 5 MG tablet Take 5 mg by mouth daily.       Marland Kitchen aspirin 81 MG tablet Take 81 mg by mouth daily.        . Biotin 10 MG CAPS Take 1 tablet by mouth.      . cholecalciferol (VITAMIN D) 1000 UNITS tablet Take 1,000 Units by mouth daily.        . cycloSPORINE (RESTASIS) 0.05 % ophthalmic emulsion Apply to eye 2 (two) times daily.        . DULoxetine (CYMBALTA) 60 MG capsule  Take 60 mg by mouth daily.      . fish oil-omega-3 fatty acids 1000 MG capsule Take 1 capsule by mouth daily.        Marland Kitchen HYDROcodone-acetaminophen (VICODIN) 2.5-500 MG per tablet Take 1 tablet by mouth 2 (two) times daily as needed.        . latanoprost (XALATAN) 0.005 % ophthalmic solution Place 1 drop into both eyes daily.      . metoprolol (LOPRESSOR) 50 MG tablet Take 50 mg by mouth 2 (two) times daily.        . Multiple Vitamin (MULTIVITAMIN) capsule Take 1 capsule by mouth daily.        . potassium chloride (KLOR-CON) 20 MEQ packet Take 20 mEq by mouth daily.        . Teriparatide, Recombinant, (FORTEO) 600 MCG/2.4ML SOLN Inject into the skin daily.        Marland Kitchen zolpidem (AMBIEN) 10 MG tablet Take 10 mg by mouth at bedtime as needed.        Marland Kitchen albuterol (PROVENTIL HFA;VENTOLIN HFA) 108 (90 BASE) MCG/ACT inhaler Inhale 2 puffs into the lungs every 6 (six) hours as needed.        . budesonide-formoterol (SYMBICORT) 160-4.5 MCG/ACT inhaler Inhale 2 puffs into the lungs 2 (two) times daily.  3 Inhaler  3  . Calcium Carbonate-Vitamin D (CALCIUM 500/D) 500-125 MG-UNIT  TABS Take 2 tablets by mouth daily.        . Olopatadine HCl (PATADAY) 0.2 % SOLN Place 1 drop into both eyes daily.        REVIEW OF SYSTEMS:  A comprehensive review of systems was negative.   PHYSICAL EXAMINATION: General appearance: alert, cooperative and no distress Neck: no adenopathy Lymph nodes: Cervical, supraclavicular, and axillary nodes normal. Resp: clear to auscultation bilaterally Cardio: regular rate and rhythm, S1, S2 normal, no murmur, click, rub or gallop GI: soft, non-tender; bowel sounds normal; no masses,  no organomegaly Extremities: extremities normal, atraumatic, no cyanosis or edema Neurologic: Alert and oriented X 3, normal strength and tone. Normal symmetric reflexes. Normal coordination and gait  ECOG PERFORMANCE STATUS: 1 - Symptomatic but completely ambulatory  Blood pressure 141/74, pulse 66,  temperature 97.1 F (36.2 C), temperature source Oral, height 5' (1.524 m), weight 111 lb 8 oz (50.576 kg).  LABORATORY DATA: Lab Results  Component Value Date   WBC 6.1 04/08/2012   HGB 15.1 04/08/2012   HCT 45.7 04/08/2012   MCV 92.6 04/08/2012   PLT 330 04/08/2012      Chemistry      Component Value Date/Time   NA 138 04/08/2012 1110   NA 138 04/10/2010 1302   K 4.3 04/08/2012 1110   K 3.9 04/10/2010 1302   CL 96* 04/08/2012 1110   CL 99 04/10/2010 1302   CO2 31 04/08/2012 1110   CO2 32 04/10/2010 1302   BUN 29* 04/08/2012 1110   BUN 16 04/10/2010 1302   CREATININE 1.4* 04/08/2012 1110   CREATININE 0.85 04/10/2010 1302      Component Value Date/Time   CALCIUM 9.7 04/08/2012 1110   CALCIUM 9.4 04/10/2010 1302   ALKPHOS 75 04/08/2012 1110   ALKPHOS 46 04/10/2010 1302   AST 33 04/08/2012 1110   AST 39* 04/10/2010 1302   ALT 32 04/10/2010 1302   BILITOT 0.80 04/08/2012 1110   BILITOT 0.7 04/10/2010 1302       RADIOGRAPHIC STUDIES: Ct Chest Wo Contrast  04/08/2012  *RADIOLOGY REPORT*  Clinical Data: Followup lung cancer.  Seed implants.  CT CHEST WITHOUT CONTRAST  Technique:  Multidetector CT imaging of the chest was performed following the standard protocol without IV contrast.  Comparison: Two most recent examination 04/11/2011 and 04/10/2010.  Findings: Majority of post-therapy parenchymal changes are stable including the soft tissue prominence surrounding radiation seed implants within the right lung.  Minimal change in the pleural thickening posterior right upper lobe (series 5 image 13). Attention to this on follow-up.  Stable mediastinal lymph nodes largest pretracheal region measuring 1.1 x 1 cm.  Stable 1.5 cm left adrenal lesion.  Scattered small liver lesions majority which appear similar to the prior exam.  Two right lower lobe sub centimeter lesions not previously imaged and too small to adequately characterize.  Renal cysts.  Atherosclerotic type changes coronary arteries, thoracic  aorta and great vessels.  Ectasia of the thoracic aorta and upper abdominal aorta.  The ascending thoracic aorta measures up to 3.8 cm without change.  Pacemaker is in place.  Scoliosis and degenerative changes without worrisome bony destructive lesion.  Substernal extension of thyroid gland with scattered nodules some which are calcified without change.  IMPRESSION: Relatively stable post therapy examination as detailed above without findings highly suspicious for recurrent tumor.  Original Report Authenticated By: Fuller Canada, M.D.    ASSESSMENT: This is a very pleasant 78 years old white female with history  of stage IA non-small cell lung cancer status post resection and has been observation since July of 2007 with no evidence for disease recurrence.  PLAN: I discussed the scan results with the patient and I given him the option of continuous observation with repeat scan on an annual basis at the cancer Center versus followup with her primary care physician with at least a chest x-ray every 6-12 months. The patient would see her primary care physician Dr. Valentina Lucks later today and she would like to followup with him. I will discharge her from the clinic at this point but will be happy to see her in the future if needed.  All questions were answered. The patient knows to call the clinic with any problems, questions or concerns. We can certainly see the patient much sooner if necessary.

## 2012-04-27 DIAGNOSIS — I495 Sick sinus syndrome: Secondary | ICD-10-CM | POA: Diagnosis not present

## 2012-04-27 DIAGNOSIS — Z950 Presence of cardiac pacemaker: Secondary | ICD-10-CM | POA: Diagnosis not present

## 2012-04-27 LAB — PACEMAKER DEVICE OBSERVATION

## 2012-05-13 DIAGNOSIS — I10 Essential (primary) hypertension: Secondary | ICD-10-CM | POA: Diagnosis not present

## 2012-05-20 ENCOUNTER — Encounter: Payer: Self-pay | Admitting: Pulmonary Disease

## 2012-05-20 ENCOUNTER — Ambulatory Visit (INDEPENDENT_AMBULATORY_CARE_PROVIDER_SITE_OTHER): Payer: Medicare Other | Admitting: Pulmonary Disease

## 2012-05-20 VITALS — BP 118/63 | HR 64 | Temp 97.4°F | Ht 60.0 in | Wt 114.4 lb

## 2012-05-20 DIAGNOSIS — J449 Chronic obstructive pulmonary disease, unspecified: Secondary | ICD-10-CM | POA: Diagnosis not present

## 2012-05-20 DIAGNOSIS — J479 Bronchiectasis, uncomplicated: Secondary | ICD-10-CM

## 2012-05-20 MED ORDER — ALBUTEROL SULFATE HFA 108 (90 BASE) MCG/ACT IN AERS: 2.0000 | INHALATION_SPRAY | Freq: Four times a day (QID) | RESPIRATORY_TRACT | Status: DC | PRN

## 2012-05-20 NOTE — Assessment & Plan Note (Signed)
The patient has moderate to severe air flow obstruction by PFTs related to her bronchiectasis, but has not wanted to stay on a consistent bronchodilator regimen.  Now that she is having increased dyspnea on exertion with activities of daily living, I have asked her to use her symbicort on a more regular basis to see if that will help.  Also asked her to continue with her exercise program, but to try and increase it to 3-4 days a week.

## 2012-05-20 NOTE — Assessment & Plan Note (Signed)
The patient does not have any history to suggest a flare of her bronchiectasis.  She has had no recent pulmonary infection.

## 2012-05-20 NOTE — Patient Instructions (Addendum)
Start taking symbicort 2 puffs in am and pm everyday no matter what.  Rinse mouth well.  Can use albuterol if needed for emergencies. Stay active, try and do your exercise program 3-4 days a week. Let us know if symbicort helps, and we can send in a prescription. Make sure you get your flu shot this fall. followup with me in 6mos if doing well.

## 2012-05-20 NOTE — Progress Notes (Signed)
  Subjective:    Patient ID: [name], female    DOB: 23-Jun-1927, 78 y.o.   MRN: [id number]  HPI The patient comes in today for followup of her known bronchiectasis with subsequent COPD.  She's been very hesitant to stay on any type of bronchodilator regimen, but has noted worsening dyspnea on exertion since her last visit.  She denies any chest congestion, cough, or purulence.  She has been prescribed symbicort, but is not taking this.  She has had a recent chest CT the end of July 2 followup for lung cancer, and everything appears to be stable.  She is on amiodarone for her atrial arrhythmia, however her chest CT does not show active alveolitis.  She does have some scarring related to her prior lung cancer treatment.   Review of Systems  Constitutional: Negative for fever and unexpected weight change.  HENT: Positive for congestion. Negative for ear pain, nosebleeds, sore throat, rhinorrhea, sneezing, trouble swallowing, dental problem, postnasal drip and sinus pressure.   Eyes: Negative for redness and itching.  Respiratory: Positive for cough, shortness of breath and wheezing. Negative for chest tightness.   Cardiovascular: Positive for palpitations and leg swelling.  Gastrointestinal: Negative for nausea and vomiting.  Genitourinary: Negative for dysuria.  Musculoskeletal: Positive for joint swelling.  Skin: Negative for rash.  Neurological: Negative for headaches.  Hematological: Bruises/bleeds easily.  Psychiatric/Behavioral: Positive for dysphoric mood. The patient is nervous/anxious.        Objective:   Physical Exam Thin female in no acute distress Nose without purulence or discharge noted Chest with isolated wheeze in the left base, a few scattered crackles, no wheezing otherwise noted Cardiac exam with slight irregularity, no murmurs or gallops noted Lower extremities with trace ankle edema, no cyanosis Alert and oriented, moves all 4 extremities.         Assessment & Plan:

## 2012-05-21 ENCOUNTER — Ambulatory Visit (INDEPENDENT_AMBULATORY_CARE_PROVIDER_SITE_OTHER): Payer: Medicare Other | Admitting: Internal Medicine

## 2012-05-21 ENCOUNTER — Encounter (HOSPITAL_COMMUNITY): Payer: Self-pay | Admitting: Pharmacy Technician

## 2012-05-21 ENCOUNTER — Encounter: Payer: Self-pay | Admitting: Internal Medicine

## 2012-05-21 ENCOUNTER — Encounter: Payer: Self-pay | Admitting: *Deleted

## 2012-05-21 ENCOUNTER — Encounter: Payer: Medicare Other | Admitting: Internal Medicine

## 2012-05-21 DIAGNOSIS — I495 Sick sinus syndrome: Secondary | ICD-10-CM

## 2012-05-21 DIAGNOSIS — I4891 Unspecified atrial fibrillation: Secondary | ICD-10-CM

## 2012-05-21 DIAGNOSIS — Z45018 Encounter for adjustment and management of other part of cardiac pacemaker: Secondary | ICD-10-CM | POA: Diagnosis not present

## 2012-05-21 DIAGNOSIS — Z950 Presence of cardiac pacemaker: Secondary | ICD-10-CM | POA: Insufficient documentation

## 2012-05-21 LAB — CBC WITH DIFFERENTIAL/PLATELET
Basophils Relative: 0.2 % (ref 0.0–3.0)
Eosinophils Absolute: 0.1 10*3/uL (ref 0.0–0.7)
Eosinophils Relative: 1 % (ref 0.0–5.0)
HCT: 44.3 % (ref 36.0–46.0)
Lymphs Abs: 1.1 10*3/uL (ref 0.7–4.0)
MCHC: 31.8 g/dL (ref 30.0–36.0)
MCV: 92.8 fl (ref 78.0–100.0)
Monocytes Absolute: 0.7 10*3/uL (ref 0.1–1.0)
Neutro Abs: 4.4 10*3/uL (ref 1.4–7.7)
RBC: 4.78 Mil/uL (ref 3.87–5.11)
WBC: 6.2 10*3/uL (ref 4.5–10.5)

## 2012-05-21 LAB — PACEMAKER DEVICE OBSERVATION
AL AMPLITUDE: 3.696 mv
AL IMPEDENCE PM: 416 Ohm
ATRIAL PACING PM: 40.04
BATTERY VOLTAGE: 2.92 V
RV LEAD IMPEDENCE PM: 432 Ohm
RV LEAD THRESHOLD: 1 V
VENTRICULAR PACING PM: 46.75

## 2012-05-21 LAB — BASIC METABOLIC PANEL
Calcium: 9.2 mg/dL (ref 8.4–10.5)
Creatinine, Ser: 1.3 mg/dL — ABNORMAL HIGH (ref 0.4–1.2)
GFR: 42.31 mL/min — ABNORMAL LOW (ref >60.00)
Glucose, Bld: 88 mg/dL (ref 70–99)
Sodium: 138 mEq/L (ref 135–145)

## 2012-05-21 NOTE — Progress Notes (Signed)
 GRIFFIN,JOHN JOSEPH, MD: Primary Cardiologist:  Dr Skains  [name] is a 78 y.o. female with a h/o tachycardia/ bradycardia sp PPM (MDT) by Dr Edmunds in 2008 who presents today to establish care in the Electrophysiology device clinic.  The patient reports doing very well since having a pacemaker implanted and remains very active despite her age.   Today, she  denies symptoms of palpitations, chest pain, shortness of breath, orthopnea, PND, lower extremity edema, dizziness, presyncope, syncope, or neurologic sequela. She has recently reached ERI battery status.  The patientis tolerating medications without difficulties and is otherwise without complaint today.   Past Medical History  Diagnosis Date  . Lung cancer 2007    hx stage 1 BAC/ RML. s/p wedge resection from RML, superior segment RLL resection s/p seed implants  . Mycobacterial disease   . Paroxysmal atrial fibrillation   . Bronchiectasis     bronch 10/2009 w/ stenotrophomonas sens to levaquin and bactrim  . Tachycardia-bradycardia syndrome 2008    sp PPM by Dr Edmunds  . Osteoporosis   . Hypertension   . Hyperlipidemia   . Multinodular goiter (nontoxic)   . Glaucoma   . Anxiety   . Sciatica    Past Surgical History  Procedure Date  . Wedge resection 2007    for lung CA  . Pacemaker insertion 5/13    MDT implanted by Dr Edmunds  . Laminectomy 1988  . Hernia repair 1988    History   Social History  . Marital Status: Widowed    Spouse Name: N/A    Number of Children: N/A  . Years of Education: N/A   Occupational History  . Not on file.   Social History Main Topics  . Smoking status: Former Smoker -- 0.5 packs/day for 55 years    Types: Cigarettes    Quit date: 05/20/2005  . Smokeless tobacco: Never Used  . Alcohol Use: No  . Drug Use: No  . Sexually Active: Not on file   Other Topics Concern  . Not on file   Social History Narrative   [place] in [place]    Family History  Problem  Relation Age of Onset  . Cancer      No Known Allergies  Current Outpatient Prescriptions  Medication Sig Dispense Refill  . albuterol (PROVENTIL HFA;VENTOLIN HFA) 108 (90 BASE) MCG/ACT inhaler Inhale 2 puffs into the lungs every 6 (six) hours as needed.  3 Inhaler  4  . amiodarone (PACERONE) 200 MG tablet Take 200 mg by mouth daily.        . amLODipine (NORVASC) 5 MG tablet Take 5 mg by mouth daily.       . aspirin 81 MG tablet Take 81 mg by mouth daily.        . Biotin 10 MG CAPS Take 1 tablet by mouth.      . budesonide-formoterol (SYMBICORT) 160-4.5 MCG/ACT inhaler Inhale 2 puffs into the lungs 2 (two) times daily.  3 Inhaler  3  . Calcium Carbonate-Vitamin D (CALCIUM 500/D) 500-125 MG-UNIT TABS Take 2 tablets by mouth daily.        . cycloSPORINE (RESTASIS) 0.05 % ophthalmic emulsion Apply to eye 2 (two) times daily.        . DULoxetine (CYMBALTA) 60 MG capsule Take 60 mg by mouth daily.      . HYDROcodone-acetaminophen (VICODIN) 2.5-500 MG per tablet Take 1 tablet by mouth 2 (two) times daily as needed.        .   latanoprost (XALATAN) 0.005 % ophthalmic solution Place 1 drop into both eyes daily.      . metoprolol (LOPRESSOR) 50 MG tablet Take 50 mg by mouth 2 (two) times daily.        . Multiple Vitamin (MULTIVITAMIN) capsule Take 1 capsule by mouth daily.        . Olopatadine HCl (PATADAY) 0.2 % SOLN Place 1 drop into both eyes daily.      . Teriparatide, Recombinant, (FORTEO) 600 MCG/2.4ML SOLN Inject into the skin daily.        . zolpidem (AMBIEN) 10 MG tablet Take 10 mg by mouth at bedtime as needed.          ROS- all systems are reviewed and negative except as per HPI  Physical Exam: There were no vitals filed for this visit.  GEN- The patient is well appearing, alert and oriented x 3 today.   Head- normocephalic, atraumatic Eyes-  Sclera clear, conjunctiva pink Ears- hearing intact Oropharynx- clear Neck- supple, no JVP Lymph- no cervical lymphadenopathy Lungs- Clear  to ausculation bilaterally, normal work of breathing Chest- pacemaker pocket is well healed Heart- Regular rate and rhythm, no murmurs, rubs or gallops, PMI not laterally displaced GI- soft, NT, ND, + BS Extremities- no clubbing, cyanosis, trace edema MS- age appropriate atrophy Skin- no rash or lesion Psych- euthymic mood, full affect Neuro- strength and sensation are intact  Pacemaker interrogation- reviewed in detail today,  See PACEART report  Assessment and Plan:   

## 2012-05-21 NOTE — Patient Instructions (Addendum)
Your physician has recommended that you have a pacemaker generator changed out

## 2012-05-21 NOTE — Assessment & Plan Note (Signed)
Presently well controlled with amiodarone Given her lung disease, we will have to be cautious with this medicine. She is not presently anticoagulated with coumadin. I will defer the decision to anticoagulate to Dr Anne Fu.

## 2012-05-21 NOTE — Assessment & Plan Note (Signed)
Pacemaker has reached ERI and converted to VVI for which she has lost AV synchrony. See [facility]rit[facility] Miss Art report She is reprogrammed back to AAIR<=>DDIR today  She will require PPM pulse generator replacement. Risks, benefits, and alternatives to the procedure were discussed at length with the patient who wishes to proceed.  We will schedule the procedure at the next available time.

## 2012-05-22 ENCOUNTER — Other Ambulatory Visit: Payer: Self-pay | Admitting: *Deleted

## 2012-05-22 DIAGNOSIS — Z45018 Encounter for adjustment and management of other part of cardiac pacemaker: Secondary | ICD-10-CM

## 2012-05-22 DIAGNOSIS — I4891 Unspecified atrial fibrillation: Secondary | ICD-10-CM

## 2012-05-24 NOTE — Progress Notes (Signed)
   Patient ID: [name], female    DOB: 10-10-1926, 78 y.o.   MRN: [id number]  HPI    Review of Systems    Physical Exam

## 2012-05-27 DIAGNOSIS — I10 Essential (primary) hypertension: Secondary | ICD-10-CM | POA: Diagnosis not present

## 2012-05-27 DIAGNOSIS — Z45018 Encounter for adjustment and management of other part of cardiac pacemaker: Secondary | ICD-10-CM | POA: Diagnosis not present

## 2012-05-27 DIAGNOSIS — I495 Sick sinus syndrome: Secondary | ICD-10-CM | POA: Diagnosis not present

## 2012-05-27 DIAGNOSIS — I4891 Unspecified atrial fibrillation: Secondary | ICD-10-CM | POA: Diagnosis not present

## 2012-05-27 DIAGNOSIS — Z85118 Personal history of other malignant neoplasm of bronchus and lung: Secondary | ICD-10-CM | POA: Diagnosis not present

## 2012-05-27 MED ORDER — CEFAZOLIN SODIUM-DEXTROSE 2-3 GM-% IV SOLR
2.0000 g | INTRAVENOUS | Status: AC
  Filled 2012-05-27 (×2): qty 50

## 2012-05-27 MED ORDER — SODIUM CHLORIDE 0.9 % IR SOLN
80.0000 mg | Status: AC
  Filled 2012-05-27 (×4): qty 2

## 2012-05-28 ENCOUNTER — Ambulatory Visit (HOSPITAL_COMMUNITY)
Admission: RE | Admit: 2012-05-28 | Discharge: 2012-05-28 | Disposition: A | Discharge status: Home / Self Care | Payer: Medicare Other | Source: Ambulatory Visit | Attending: Internal Medicine | Admitting: Internal Medicine

## 2012-05-28 ENCOUNTER — Encounter (HOSPITAL_COMMUNITY)
Admission: RE | Disposition: A | Discharge status: Home / Self Care | Payer: Self-pay | Source: Ambulatory Visit | Attending: Internal Medicine

## 2012-05-28 DIAGNOSIS — J449 Chronic obstructive pulmonary disease, unspecified: Secondary | ICD-10-CM | POA: Diagnosis not present

## 2012-05-28 DIAGNOSIS — I4891 Unspecified atrial fibrillation: Secondary | ICD-10-CM | POA: Diagnosis present

## 2012-05-28 DIAGNOSIS — Z45018 Encounter for adjustment and management of other part of cardiac pacemaker: Secondary | ICD-10-CM | POA: Diagnosis not present

## 2012-05-28 DIAGNOSIS — I10 Essential (primary) hypertension: Secondary | ICD-10-CM | POA: Diagnosis not present

## 2012-05-28 DIAGNOSIS — Z950 Presence of cardiac pacemaker: Secondary | ICD-10-CM | POA: Diagnosis present

## 2012-05-28 DIAGNOSIS — I495 Sick sinus syndrome: Secondary | ICD-10-CM | POA: Diagnosis present

## 2012-05-28 DIAGNOSIS — J984 Other disorders of lung: Secondary | ICD-10-CM | POA: Diagnosis not present

## 2012-05-28 DIAGNOSIS — Z01811 Encounter for preprocedural respiratory examination: Secondary | ICD-10-CM | POA: Diagnosis not present

## 2012-05-28 DIAGNOSIS — Z85118 Personal history of other malignant neoplasm of bronchus and lung: Secondary | ICD-10-CM | POA: Diagnosis not present

## 2012-05-28 HISTORY — PX: PERMANENT PACEMAKER GENERATOR CHANGE: SHX6022

## 2012-05-28 SURGERY — PERMANENT PACEMAKER GENERATOR CHANGE
Anesthesia: LOCAL

## 2012-05-28 MED ORDER — MIDAZOLAM HCL 5 MG/5ML IJ SOLN
INTRAMUSCULAR | Status: AC
  Filled 2012-05-28: qty 5

## 2012-05-28 MED ORDER — MUPIROCIN 2 % EX OINT
TOPICAL_OINTMENT | CUTANEOUS | Status: AC
  Administered 2012-05-28: 1
  Filled 2012-05-28: qty 22

## 2012-05-28 MED ORDER — ONDANSETRON HCL 4 MG/2ML IJ SOLN: 4.0000 mg | Freq: Four times a day (QID) | INTRAMUSCULAR | Status: DC | PRN

## 2012-05-28 MED ORDER — SODIUM CHLORIDE 0.45 % IV SOLN
INTRAVENOUS | Status: DC
  Administered 2012-05-28: 12:00:00 via INTRAVENOUS

## 2012-05-28 MED ORDER — SODIUM CHLORIDE 0.9 % IV SOLN: 250.0000 mL | INTRAVENOUS | Status: DC | PRN

## 2012-05-28 MED ORDER — FENTANYL CITRATE 0.05 MG/ML IJ SOLN
INTRAMUSCULAR | Status: AC
  Filled 2012-05-28: qty 2

## 2012-05-28 MED ORDER — CHLORHEXIDINE GLUCONATE 4 % EX LIQD
60.0000 mL | Freq: Once | CUTANEOUS | Status: DC
  Filled 2012-05-28: qty 60

## 2012-05-28 MED ORDER — SODIUM CHLORIDE 0.9 % IJ SOLN: 3.0000 mL | INTRAMUSCULAR | Status: DC | PRN

## 2012-05-28 MED ORDER — HYDROCODONE-ACETAMINOPHEN 5-325 MG PO TABS: 1.0000 | ORAL_TABLET | ORAL | Status: DC | PRN

## 2012-05-28 MED ORDER — ACETAMINOPHEN 325 MG PO TABS
325.0000 mg | ORAL_TABLET | ORAL | Status: DC | PRN
  Filled 2012-05-28: qty 2

## 2012-05-28 MED ORDER — LIDOCAINE HCL (PF) 1 % IJ SOLN
INTRAMUSCULAR | Status: AC
  Filled 2012-05-28: qty 60

## 2012-05-28 MED ORDER — SODIUM CHLORIDE 0.9 % IJ SOLN: 3.0000 mL | Freq: Two times a day (BID) | INTRAMUSCULAR | Status: DC

## 2012-05-28 NOTE — Interval H&P Note (Signed)
History and Physical Interval Note:  05/28/2012 2:16 PM  [name]  has presented today for surgery, with the diagnosis of End of life  The various methods of treatment have been discussed with the patient and family. After consideration of risks, benefits and other options for treatment, the patient has consented to  Procedure(s) (LRB) with comments: PERMANENT PACEMAKER GENERATOR CHANGE (N/A) as a surgical intervention .  The patient's history has been reviewed, patient examined, no change in status, stable for surgery.  I have reviewed the patient's chart and labs.  Questions were answered to the patient's satisfaction.     Hillis Range

## 2012-05-28 NOTE — H&P (View-Only) (Signed)
Michele Mountain, MD: Primary Cardiologist:  Dr Rexene Alberts is a 78 y.o. female with a h/o tachycardia/ bradycardia sp PPM (MDT) by Dr Amil Amen in 2008 who presents today to establish care in the Electrophysiology device clinic.  The patient reports doing very well since having a pacemaker implanted and remains very active despite her age.   Today, she  denies symptoms of palpitations, chest pain, shortness of breath, orthopnea, PND, lower extremity edema, dizziness, presyncope, syncope, or neurologic sequela. She has recently reached ERI battery status.  The patientis tolerating medications without difficulties and is otherwise without complaint today.   Past Medical History  Diagnosis Date  . Lung cancer 2007    hx stage 1 BAC/ RML. s/p wedge resection from RML, superior segment RLL resection s/p seed implants  . Mycobacterial disease   . Paroxysmal atrial fibrillation   . Bronchiectasis     bronch 10/2009 w/ stenotrophomonas sens to levaquin and bactrim  . Tachycardia-bradycardia syndrome 2008    sp PPM by Dr Amil Amen  . Osteoporosis   . Hypertension   . Hyperlipidemia   . Multinodular goiter (nontoxic)   . Glaucoma   . Anxiety   . Sciatica    Past Surgical History  Procedure Date  . Wedge resection 2007    for lung CA  . Pacemaker insertion 5/13    MDT implanted by Dr Amil Amen  . Laminectomy 1988  . Hernia repair 1988    History   Social History  . Marital Status: Widowed    Spouse Name: N/A    Number of Children: N/A  . Years of Education: N/A   Occupational History  . Not on file.   Social History Main Topics  . Smoking status: Former Smoker -- 0.5 packs/day for 55 years    Types: Cigarettes    Quit date: 05/20/2005  . Smokeless tobacco: Never Used  . Alcohol Use: No  . Drug Use: No  . Sexually Active: Not on file   Other Topics Concern  . Not on file   Social History Narrative   WidowedLives in [place]    Family History  Problem  Relation Age of Onset  . Cancer      No Known Allergies  Current Outpatient Prescriptions  Medication Sig Dispense Refill  . albuterol (PROVENTIL HFA;VENTOLIN HFA) 108 (90 BASE) MCG/ACT inhaler Inhale 2 puffs into the lungs every 6 (six) hours as needed.  3 Inhaler  4  . amiodarone (PACERONE) 200 MG tablet Take 200 mg by mouth daily.        Marland Kitchen amLODipine (NORVASC) 5 MG tablet Take 5 mg by mouth daily.       Marland Kitchen aspirin 81 MG tablet Take 81 mg by mouth daily.        . Biotin 10 MG CAPS Take 1 tablet by mouth.      . budesonide-formoterol (SYMBICORT) 160-4.5 MCG/ACT inhaler Inhale 2 puffs into the lungs 2 (two) times daily.  3 Inhaler  3  . Calcium Carbonate-Vitamin D (CALCIUM 500/D) 500-125 MG-UNIT TABS Take 2 tablets by mouth daily.        . cycloSPORINE (RESTASIS) 0.05 % ophthalmic emulsion Apply to eye 2 (two) times daily.        . DULoxetine (CYMBALTA) 60 MG capsule Take 60 mg by mouth daily.      Marland Kitchen HYDROcodone-acetaminophen (VICODIN) 2.5-500 MG per tablet Take 1 tablet by mouth 2 (two) times daily as needed.        Marland Kitchen  latanoprost (XALATAN) 0.005 % ophthalmic solution Place 1 drop into both eyes daily.      . metoprolol (LOPRESSOR) 50 MG tablet Take 50 mg by mouth 2 (two) times daily.        . Multiple Vitamin (MULTIVITAMIN) capsule Take 1 capsule by mouth daily.        . Olopatadine HCl (PATADAY) 0.2 % SOLN Place 1 drop into both eyes daily.      . Teriparatide, Recombinant, (FORTEO) 600 MCG/2.4ML SOLN Inject into the skin daily.        Marland Kitchen zolpidem (AMBIEN) 10 MG tablet Take 10 mg by mouth at bedtime as needed.          ROS- all systems are reviewed and negative except as per HPI  Physical Exam: There were no vitals filed for this visit.  GEN- The patient is well appearing, alert and oriented x 3 today.   Head- normocephalic, atraumatic Eyes-  Sclera clear, conjunctiva pink Ears- hearing intact Oropharynx- clear Neck- supple, no JVP Lymph- no cervical lymphadenopathy Lungs- Clear  to ausculation bilaterally, normal work of breathing Chest- pacemaker pocket is well healed Heart- Regular rate and rhythm, no murmurs, rubs or gallops, PMI not laterally displaced GI- soft, NT, ND, + BS Extremities- no clubbing, cyanosis, trace edema MS- age appropriate atrophy Skin- no rash or lesion Psych- euthymic mood, full affect Neuro- strength and sensation are intact  Pacemaker interrogation- reviewed in detail today,  See PACEART report  Assessment and Plan:

## 2012-05-28 NOTE — Op Note (Signed)
SURGEON:  Hillis Range, MD     PREPROCEDURE DIAGNOSES:   1. Tachycardia- bradycardia syndrome.   2. Paroxsymal Atrial fibrillation.     POSTPROCEDURE DIAGNOSES:   1. Tachycardia- bradycardia syndrome.   2. Paroxsymal Atrial fibrillation.     PROCEDURES:   1. Pacemaker pulse generator replacement.   2. Skin pocket revision.     INTRODUCTION:  [name] is a 78 y.o. female with a history of tachycardia- bradycardia syndrome and atrial fibrillation . She has done well since her pacemaker was implanted.  She has recently reached ERI battery status.  She presents today for pacemaker pulse generator replacement.       DESCRIPTION OF THE PROCEDURE:  Informed written consent was obtained, and the patient was brought to the electrophysiology lab in the fasting state.  The patient's pacemaker was interrogated today and found to be at elective replacement indicator battery status.  The patient required no sedation for the procedure today.  The patient's left chest was prepped and draped in the usual sterile fashion by the EP lab staff.  The skin overlying the existing pacemaker was infiltrated with lidocaine for local analgesia.  A 4-cm incision was made over the pacemaker pocket.  Using a combination of sharp and blunt dissection, the pacemaker was exposed and removed from the body.  The device was disconnected from the leads. A single silk suture was identified and removed which had secured the device to the pectoralis fascia.  There was no foreign matter or debris within the pocket.  The atrial lead was confirmed to be a Medtronic model [id number] (serial number [id number]) lead implanted on 01/19/07.  The right ventricular lead was confirmed to be a Medtronic model [id number] (serial number [id number]) lead implanted on the same date as the atrial lead (above).  Both leads were examined and their integrity was confirmed to be intact.  Atrial lead P-waves measured 2.8 mV with impedance of 405 ohms and a  threshold of 1.2 V at 0.5 msec.  Right ventricular lead R-waves measured 11.2 mV with impedance of 488 ohms and a threshold of 1.2 V at 0.5 msec.  Both leads were connected to a Medtronic adapt L model ADDDR 1 (serial number [id number]) pacemaker.  The pocket was revised to accommodate this new device.  Electrocautery was required to assure hemostasis.  The pocket was irrigated with copious gentamicin solution. The pacemaker was then placed into the pocket.  The pocket was then closed in 2 layers with 2-0 Vicryl suture over the subcutaneous and subcuticular layers.  Steri-Strips and a sterile dressing were then applied.  There were no early apparent complications.     CONCLUSIONS:   1. Successful pacemaker pulse generator replacement for elective replacement indicator battery status   2. No early apparent complications.     Hillis Range, MD 05/28/2012 3:15 PM

## 2012-06-05 ENCOUNTER — Encounter: Payer: Medicare Other | Admitting: Internal Medicine

## 2012-06-08 ENCOUNTER — Ambulatory Visit (INDEPENDENT_AMBULATORY_CARE_PROVIDER_SITE_OTHER): Payer: Medicare Other | Admitting: *Deleted

## 2012-06-08 DIAGNOSIS — I4891 Unspecified atrial fibrillation: Secondary | ICD-10-CM

## 2012-06-08 DIAGNOSIS — I495 Sick sinus syndrome: Secondary | ICD-10-CM

## 2012-06-08 LAB — PACEMAKER DEVICE OBSERVATION
AL AMPLITUDE: 5.6 mv
BATTERY VOLTAGE: 2.79 V
RV LEAD IMPEDENCE PM: 505 Ohm
RV LEAD THRESHOLD: 20 V
VENTRICULAR PACING PM: 0

## 2012-06-08 NOTE — Progress Notes (Signed)
Wound check pacer in clinic  

## 2012-06-09 ENCOUNTER — Telehealth: Payer: Self-pay | Admitting: Pulmonary Disease

## 2012-06-09 MED ORDER — BUDESONIDE-FORMOTEROL FUMARATE 160-4.5 MCG/ACT IN AERO: 2.0000 | INHALATION_SPRAY | Freq: Two times a day (BID) | RESPIRATORY_TRACT | Status: DC

## 2012-06-09 NOTE — Addendum Note (Signed)
Addended by: Nita Sells on: 06/09/2012 04:52 PM   Modules accepted: Orders

## 2012-06-09 NOTE — Telephone Encounter (Signed)
ADDENDUM: Okay to fill Symbicort 160 rx per [facility]. Pt aware that Rx being phoned in to Pharm.  Symbicort 160 -2 puffs BID x1RF sent to [facility]. (3 month supply)

## 2012-06-09 NOTE — Telephone Encounter (Signed)
Okay to fill Symbicort rx per [facility]. Pt aware that Rx being phoned in to Pharm. Symbicort 2 puffs BID x1RF sent to

## 2012-06-17 DIAGNOSIS — Z961 Presence of intraocular lens: Secondary | ICD-10-CM | POA: Diagnosis not present

## 2012-06-17 DIAGNOSIS — H43399 Other vitreous opacities, unspecified eye: Secondary | ICD-10-CM | POA: Diagnosis not present

## 2012-06-17 DIAGNOSIS — H353190 Nonexudative age-related macular degeneration, unspecified eye, stage unspecified: Secondary | ICD-10-CM | POA: Diagnosis not present

## 2012-06-17 DIAGNOSIS — H01009 Unspecified blepharitis unspecified eye, unspecified eyelid: Secondary | ICD-10-CM | POA: Diagnosis not present

## 2012-06-17 DIAGNOSIS — H524 Presbyopia: Secondary | ICD-10-CM | POA: Diagnosis not present

## 2012-06-17 DIAGNOSIS — H4011X0 Primary open-angle glaucoma, stage unspecified: Secondary | ICD-10-CM | POA: Diagnosis not present

## 2012-06-30 ENCOUNTER — Encounter: Payer: Self-pay | Admitting: Internal Medicine

## 2012-08-18 DIAGNOSIS — Z950 Presence of cardiac pacemaker: Secondary | ICD-10-CM | POA: Diagnosis not present

## 2012-08-31 ENCOUNTER — Encounter: Payer: Medicare Other | Admitting: Internal Medicine

## 2012-09-16 DIAGNOSIS — Z8719 Personal history of other diseases of the digestive system: Secondary | ICD-10-CM

## 2012-09-16 HISTORY — DX: Personal history of other diseases of the digestive system: Z87.19

## 2012-09-24 DIAGNOSIS — M810 Age-related osteoporosis without current pathological fracture: Secondary | ICD-10-CM | POA: Diagnosis not present

## 2012-09-24 DIAGNOSIS — M2550 Pain in unspecified joint: Secondary | ICD-10-CM | POA: Diagnosis not present

## 2012-09-24 DIAGNOSIS — I10 Essential (primary) hypertension: Secondary | ICD-10-CM | POA: Diagnosis not present

## 2012-09-24 DIAGNOSIS — C3490 Malignant neoplasm of unspecified part of unspecified bronchus or lung: Secondary | ICD-10-CM | POA: Diagnosis not present

## 2012-09-24 DIAGNOSIS — D509 Iron deficiency anemia, unspecified: Secondary | ICD-10-CM | POA: Diagnosis not present

## 2012-09-25 DIAGNOSIS — I10 Essential (primary) hypertension: Secondary | ICD-10-CM | POA: Diagnosis not present

## 2012-09-25 DIAGNOSIS — I4891 Unspecified atrial fibrillation: Secondary | ICD-10-CM | POA: Diagnosis not present

## 2012-09-25 DIAGNOSIS — I495 Sick sinus syndrome: Secondary | ICD-10-CM | POA: Diagnosis not present

## 2012-09-25 DIAGNOSIS — Z950 Presence of cardiac pacemaker: Secondary | ICD-10-CM | POA: Diagnosis not present

## 2012-09-25 DIAGNOSIS — E785 Hyperlipidemia, unspecified: Secondary | ICD-10-CM | POA: Diagnosis not present

## 2012-09-29 DIAGNOSIS — D509 Iron deficiency anemia, unspecified: Secondary | ICD-10-CM | POA: Diagnosis not present

## 2012-10-07 DIAGNOSIS — H01009 Unspecified blepharitis unspecified eye, unspecified eyelid: Secondary | ICD-10-CM | POA: Diagnosis not present

## 2012-10-07 DIAGNOSIS — H04129 Dry eye syndrome of unspecified lacrimal gland: Secondary | ICD-10-CM | POA: Diagnosis not present

## 2012-10-07 DIAGNOSIS — H4011X0 Primary open-angle glaucoma, stage unspecified: Secondary | ICD-10-CM | POA: Diagnosis not present

## 2012-11-05 DIAGNOSIS — M810 Age-related osteoporosis without current pathological fracture: Secondary | ICD-10-CM | POA: Diagnosis not present

## 2012-11-18 ENCOUNTER — Encounter: Payer: Self-pay | Admitting: Pulmonary Disease

## 2012-11-18 ENCOUNTER — Ambulatory Visit (INDEPENDENT_AMBULATORY_CARE_PROVIDER_SITE_OTHER): Payer: Medicare Other | Admitting: Pulmonary Disease

## 2012-11-18 VITALS — BP 110/68 | HR 74 | Temp 97.5°F | Ht 60.0 in | Wt 116.2 lb

## 2012-11-18 DIAGNOSIS — J479 Bronchiectasis, uncomplicated: Secondary | ICD-10-CM

## 2012-11-18 DIAGNOSIS — J449 Chronic obstructive pulmonary disease, unspecified: Secondary | ICD-10-CM | POA: Diagnosis not present

## 2012-11-18 MED ORDER — MAGIC MOUTHWASH: ORAL | Status: DC

## 2012-11-18 NOTE — Progress Notes (Signed)
  Subjective:    Patient ID: [name], female    DOB: 08/07/1927, 79 y.o.   MRN: [id number]  HPI Patient comes in today for followup of her known bronchiectasis with associated COPD.  She was started on symbicort at the last visit for dyspnea on exertion, and feels this has definitely helped her breathing.  She is having no side effects from the inhaler, and has been keeping her mouth rinsed.  She is complaining about a mild ulceration on her palate and would like me to look at this.  She has not had a flareup of her bronchiectasis since the last visit.  She does describe audible wheezing at times that is a classic description for upper airway pseudowheezing.    Review of Systems  Constitutional: Negative for fever and unexpected weight change.  HENT: Negative for ear pain, nosebleeds, congestion, sore throat, rhinorrhea, sneezing, trouble swallowing, dental problem, postnasal drip and sinus pressure.   Eyes: Negative for redness and itching.  Respiratory: Positive for cough ( with exertion), shortness of breath ( with exertion) and wheezing. Negative for chest tightness.   Cardiovascular: Negative for palpitations and leg swelling.  Gastrointestinal: Negative for nausea and vomiting.  Genitourinary: Negative for dysuria.  Musculoskeletal: Negative for joint swelling.  Skin: Negative for rash.  Neurological: Negative for headaches.  Hematological: Does not bruise/bleed easily.  Psychiatric/Behavioral: Negative for dysphoric mood. The patient is not nervous/anxious.        Objective:   Physical Exam Well-developed female in no acute distress Nose without purulence or discharge noted Oropharynx with small ulcer on palate, but no evidence for thrush Neck without lymphadenopathy or thyromegaly Chest with mild decreased breath sounds, a few scattered crackles, no wheezing Cardiac exam with regular rate and rhythm Lower extremities with minimal edema, no cyanosis Alert and  oriented, moves all 4 extremities.       Assessment & Plan:

## 2012-11-18 NOTE — Assessment & Plan Note (Signed)
The patient has been doing better with respect to her exertional tolerance since being on the symbicort.  I have asked her to continue on this medication, and to keep her mouth rinsed well.

## 2012-11-18 NOTE — Patient Instructions (Addendum)
Will give you a prescription for magic mouthwash to see if that heals up your mouth ulcer. No change in your breathing meds Keep working on your exercise program followup with me in 6mos.

## 2012-11-18 NOTE — Assessment & Plan Note (Signed)
The patient has not had any recent flareups or pulmonary infections.

## 2012-11-19 ENCOUNTER — Telehealth: Payer: Self-pay | Admitting: Pulmonary Disease

## 2012-11-19 MED ORDER — MAGIC MOUTHWASH: ORAL | Status: DC

## 2012-11-19 NOTE — Telephone Encounter (Signed)
LM with [facility] letting them know that we sent that rx in error and they don't need to fill it.

## 2012-11-19 NOTE — Telephone Encounter (Signed)
The correct rx has been sent in, the pt is aware. I apologized for any inconvenience.

## 2012-11-23 DIAGNOSIS — I4891 Unspecified atrial fibrillation: Secondary | ICD-10-CM | POA: Diagnosis not present

## 2012-11-23 DIAGNOSIS — Z950 Presence of cardiac pacemaker: Secondary | ICD-10-CM | POA: Diagnosis not present

## 2012-11-30 ENCOUNTER — Other Ambulatory Visit: Payer: Self-pay | Admitting: *Deleted

## 2012-11-30 MED ORDER — MAGIC MOUTHWASH: ORAL | Status: DC

## 2012-11-30 NOTE — Telephone Encounter (Signed)
Magic Mouthwash Use 10cc to Swish, Gargle and Spit tid x 5 days. Sent to [facility], Rx refill received stating needed to be faxed to [facility]. Marland Kitchen

## 2013-01-14 DIAGNOSIS — K5792 Diverticulitis of intestine, part unspecified, without perforation or abscess without bleeding: Secondary | ICD-10-CM

## 2013-01-14 HISTORY — DX: Diverticulitis of intestine, part unspecified, without perforation or abscess without bleeding: K57.92

## 2013-01-20 DIAGNOSIS — H04129 Dry eye syndrome of unspecified lacrimal gland: Secondary | ICD-10-CM | POA: Diagnosis not present

## 2013-01-20 DIAGNOSIS — H01009 Unspecified blepharitis unspecified eye, unspecified eyelid: Secondary | ICD-10-CM | POA: Diagnosis not present

## 2013-01-20 DIAGNOSIS — H409 Unspecified glaucoma: Secondary | ICD-10-CM | POA: Diagnosis not present

## 2013-01-20 DIAGNOSIS — H4011X0 Primary open-angle glaucoma, stage unspecified: Secondary | ICD-10-CM | POA: Diagnosis not present

## 2013-01-26 ENCOUNTER — Inpatient Hospital Stay (HOSPITAL_COMMUNITY)
Admission: EM | Admit: 2013-01-26 | Discharge: 2013-02-04 | DRG: 392 | Disposition: A | Discharge status: Home Health | Payer: Medicare Other | Attending: Internal Medicine | Admitting: Internal Medicine

## 2013-01-26 ENCOUNTER — Encounter (HOSPITAL_COMMUNITY): Payer: Self-pay

## 2013-01-26 ENCOUNTER — Emergency Department (HOSPITAL_COMMUNITY): Payer: Medicare Other

## 2013-01-26 DIAGNOSIS — K560 Paralytic ileus: Secondary | ICD-10-CM | POA: Diagnosis not present

## 2013-01-26 DIAGNOSIS — R1084 Generalized abdominal pain: Secondary | ICD-10-CM | POA: Diagnosis present

## 2013-01-26 DIAGNOSIS — Z87891 Personal history of nicotine dependence: Secondary | ICD-10-CM

## 2013-01-26 DIAGNOSIS — R7309 Other abnormal glucose: Secondary | ICD-10-CM | POA: Diagnosis present

## 2013-01-26 DIAGNOSIS — I4891 Unspecified atrial fibrillation: Secondary | ICD-10-CM | POA: Diagnosis not present

## 2013-01-26 DIAGNOSIS — J479 Bronchiectasis, uncomplicated: Secondary | ICD-10-CM | POA: Diagnosis present

## 2013-01-26 DIAGNOSIS — R0609 Other forms of dyspnea: Secondary | ICD-10-CM | POA: Diagnosis present

## 2013-01-26 DIAGNOSIS — J449 Chronic obstructive pulmonary disease, unspecified: Secondary | ICD-10-CM | POA: Diagnosis present

## 2013-01-26 DIAGNOSIS — D72829 Elevated white blood cell count, unspecified: Secondary | ICD-10-CM | POA: Diagnosis present

## 2013-01-26 DIAGNOSIS — Z85118 Personal history of other malignant neoplasm of bronchus and lung: Secondary | ICD-10-CM

## 2013-01-26 DIAGNOSIS — J441 Chronic obstructive pulmonary disease with (acute) exacerbation: Secondary | ICD-10-CM | POA: Diagnosis not present

## 2013-01-26 DIAGNOSIS — K5720 Diverticulitis of large intestine with perforation and abscess without bleeding: Secondary | ICD-10-CM | POA: Diagnosis present

## 2013-01-26 DIAGNOSIS — I10 Essential (primary) hypertension: Secondary | ICD-10-CM | POA: Diagnosis present

## 2013-01-26 DIAGNOSIS — K5732 Diverticulitis of large intestine without perforation or abscess without bleeding: Secondary | ICD-10-CM | POA: Diagnosis not present

## 2013-01-26 DIAGNOSIS — R110 Nausea: Secondary | ICD-10-CM | POA: Diagnosis not present

## 2013-01-26 DIAGNOSIS — N838 Other noninflammatory disorders of ovary, fallopian tube and broad ligament: Secondary | ICD-10-CM | POA: Diagnosis present

## 2013-01-26 DIAGNOSIS — A419 Sepsis, unspecified organism: Secondary | ICD-10-CM | POA: Diagnosis present

## 2013-01-26 DIAGNOSIS — K630 Abscess of intestine: Secondary | ICD-10-CM | POA: Diagnosis not present

## 2013-01-26 DIAGNOSIS — J90 Pleural effusion, not elsewhere classified: Secondary | ICD-10-CM | POA: Diagnosis not present

## 2013-01-26 DIAGNOSIS — I700 Atherosclerosis of aorta: Secondary | ICD-10-CM | POA: Diagnosis not present

## 2013-01-26 DIAGNOSIS — R0989 Other specified symptoms and signs involving the circulatory and respiratory systems: Secondary | ICD-10-CM | POA: Diagnosis present

## 2013-01-26 DIAGNOSIS — Z66 Do not resuscitate: Secondary | ICD-10-CM | POA: Diagnosis present

## 2013-01-26 DIAGNOSIS — E876 Hypokalemia: Secondary | ICD-10-CM | POA: Diagnosis present

## 2013-01-26 DIAGNOSIS — Z950 Presence of cardiac pacemaker: Secondary | ICD-10-CM | POA: Diagnosis not present

## 2013-01-26 DIAGNOSIS — K8020 Calculus of gallbladder without cholecystitis without obstruction: Secondary | ICD-10-CM | POA: Diagnosis not present

## 2013-01-26 DIAGNOSIS — K5900 Constipation, unspecified: Secondary | ICD-10-CM | POA: Diagnosis present

## 2013-01-26 DIAGNOSIS — K5792 Diverticulitis of intestine, part unspecified, without perforation or abscess without bleeding: Secondary | ICD-10-CM

## 2013-01-26 DIAGNOSIS — D649 Anemia, unspecified: Secondary | ICD-10-CM | POA: Diagnosis not present

## 2013-01-26 DIAGNOSIS — K56609 Unspecified intestinal obstruction, unspecified as to partial versus complete obstruction: Secondary | ICD-10-CM | POA: Diagnosis not present

## 2013-01-26 DIAGNOSIS — R109 Unspecified abdominal pain: Secondary | ICD-10-CM | POA: Diagnosis not present

## 2013-01-26 DIAGNOSIS — K639 Disease of intestine, unspecified: Secondary | ICD-10-CM | POA: Diagnosis not present

## 2013-01-26 DIAGNOSIS — Z9889 Other specified postprocedural states: Secondary | ICD-10-CM | POA: Diagnosis not present

## 2013-01-26 DIAGNOSIS — N83209 Unspecified ovarian cyst, unspecified side: Secondary | ICD-10-CM | POA: Diagnosis present

## 2013-01-26 DIAGNOSIS — E785 Hyperlipidemia, unspecified: Secondary | ICD-10-CM | POA: Diagnosis present

## 2013-01-26 DIAGNOSIS — R1031 Right lower quadrant pain: Secondary | ICD-10-CM | POA: Diagnosis not present

## 2013-01-26 DIAGNOSIS — R739 Hyperglycemia, unspecified: Secondary | ICD-10-CM | POA: Diagnosis present

## 2013-01-26 DIAGNOSIS — J4489 Other specified chronic obstructive pulmonary disease: Secondary | ICD-10-CM | POA: Diagnosis present

## 2013-01-26 DIAGNOSIS — R062 Wheezing: Secondary | ICD-10-CM | POA: Diagnosis present

## 2013-01-26 HISTORY — DX: Diverticulitis of large intestine with perforation and abscess without bleeding: K57.20

## 2013-01-26 LAB — COMPREHENSIVE METABOLIC PANEL
ALT: 19 U/L (ref 0–35)
AST: 30 U/L (ref 0–37)
Albumin: 3.5 g/dL (ref 3.5–5.2)
Alkaline Phosphatase: 71 U/L (ref 39–117)
Glucose, Bld: 132 mg/dL — ABNORMAL HIGH (ref 70–99)
Potassium: 3.2 mEq/L — ABNORMAL LOW (ref 3.5–5.1)
Sodium: 134 mEq/L — ABNORMAL LOW (ref 135–145)
Total Protein: 7.3 g/dL (ref 6.0–8.3)

## 2013-01-26 LAB — URINALYSIS, ROUTINE W REFLEX MICROSCOPIC
Bilirubin Urine: NEGATIVE
Glucose, UA: 250 mg/dL — AB
Specific Gravity, Urine: 1.021 (ref 1.005–1.030)
pH: 6 (ref 5.0–8.0)

## 2013-01-26 LAB — CBC WITH DIFFERENTIAL/PLATELET
Eosinophils Absolute: 0 10*3/uL (ref 0.0–0.7)
Eosinophils Relative: 0 % (ref 0–5)
HCT: 41.5 % (ref 36.0–46.0)
Hemoglobin: 13.8 g/dL (ref 12.0–15.0)
Lymphs Abs: 0.7 10*3/uL (ref 0.7–4.0)
MCH: 29.6 pg (ref 26.0–34.0)
MCV: 88.9 fL (ref 78.0–100.0)
Monocytes Absolute: 0.6 10*3/uL (ref 0.1–1.0)
Monocytes Relative: 3 % (ref 3–12)
RBC: 4.67 MIL/uL (ref 3.87–5.11)

## 2013-01-26 LAB — URINE MICROSCOPIC-ADD ON

## 2013-01-26 MED ORDER — ONDANSETRON HCL 4 MG/2ML IJ SOLN
4.0000 mg | Freq: Once | INTRAMUSCULAR | Status: AC
  Administered 2013-01-26: 4 mg via INTRAVENOUS
  Filled 2013-01-26: qty 2

## 2013-01-26 MED ORDER — LATANOPROST 0.005 % OP SOLN
1.0000 [drp] | Freq: Every day | OPHTHALMIC | Status: DC
  Administered 2013-01-26 – 2013-02-03 (×8): 1 [drp] via OPHTHALMIC
  Filled 2013-01-26 (×4): qty 2.5

## 2013-01-26 MED ORDER — SODIUM CHLORIDE 0.9 % IJ SOLN
3.0000 mL | Freq: Two times a day (BID) | INTRAMUSCULAR | Status: DC
  Administered 2013-01-26 – 2013-02-03 (×14): 3 mL via INTRAVENOUS

## 2013-01-26 MED ORDER — ADULT MULTIVITAMIN W/MINERALS CH
1.0000 | ORAL_TABLET | Freq: Every day | ORAL | Status: DC
  Administered 2013-01-26 – 2013-01-27 (×2): 1 via ORAL
  Filled 2013-01-26 (×3): qty 1

## 2013-01-26 MED ORDER — POLYETHYLENE GLYCOL 3350 17 G PO PACK
17.0000 g | PACK | Freq: Every day | ORAL | Status: DC | PRN
  Administered 2013-02-01 – 2013-02-02 (×2): 17 g via ORAL
  Filled 2013-01-26 (×2): qty 1

## 2013-01-26 MED ORDER — ASPIRIN EC 81 MG PO TBEC
81.0000 mg | DELAYED_RELEASE_TABLET | Freq: Every day | ORAL | Status: DC
  Administered 2013-01-26 – 2013-02-03 (×9): 81 mg via ORAL
  Filled 2013-01-26 (×10): qty 1

## 2013-01-26 MED ORDER — OMEGA-3-ACID ETHYL ESTERS 1 G PO CAPS
1.0000 g | ORAL_CAPSULE | Freq: Every day | ORAL | Status: DC
  Administered 2013-01-26 – 2013-01-27 (×2): 1 g via ORAL
  Filled 2013-01-26 (×3): qty 1

## 2013-01-26 MED ORDER — ACETAMINOPHEN 650 MG RE SUPP: 650.0000 mg | Freq: Four times a day (QID) | RECTAL | Status: DC | PRN

## 2013-01-26 MED ORDER — SODIUM CHLORIDE 0.9 % IV BOLUS (SEPSIS)
1000.0000 mL | Freq: Once | INTRAVENOUS | Status: AC
  Administered 2013-01-26: 1000 mL via INTRAVENOUS

## 2013-01-26 MED ORDER — CIPROFLOXACIN IN D5W 400 MG/200ML IV SOLN
400.0000 mg | Freq: Two times a day (BID) | INTRAVENOUS | Status: DC
  Administered 2013-01-27 – 2013-01-29 (×5): 400 mg via INTRAVENOUS
  Filled 2013-01-26 (×6): qty 200

## 2013-01-26 MED ORDER — AMIODARONE HCL 100 MG PO TABS
100.0000 mg | ORAL_TABLET | Freq: Every day | ORAL | Status: DC
  Administered 2013-01-26 – 2013-02-04 (×10): 100 mg via ORAL
  Filled 2013-01-26 (×10): qty 1

## 2013-01-26 MED ORDER — ZOLPIDEM TARTRATE 5 MG PO TABS
5.0000 mg | ORAL_TABLET | Freq: Every evening | ORAL | Status: DC | PRN
  Administered 2013-01-28: 5 mg via ORAL
  Filled 2013-01-26: qty 1

## 2013-01-26 MED ORDER — ENOXAPARIN SODIUM 40 MG/0.4ML ~~LOC~~ SOLN
40.0000 mg | SUBCUTANEOUS | Status: DC
  Administered 2013-01-26 – 2013-02-03 (×9): 40 mg via SUBCUTANEOUS
  Filled 2013-01-26 (×10): qty 0.4

## 2013-01-26 MED ORDER — IOHEXOL 300 MG/ML  SOLN
80.0000 mL | Freq: Once | INTRAMUSCULAR | Status: AC | PRN
  Administered 2013-01-26: 80 mL via INTRAVENOUS

## 2013-01-26 MED ORDER — AMLODIPINE BESYLATE 5 MG PO TABS
5.0000 mg | ORAL_TABLET | Freq: Every day | ORAL | Status: DC
  Administered 2013-01-26 – 2013-01-31 (×6): 5 mg via ORAL
  Filled 2013-01-26 (×7): qty 1

## 2013-01-26 MED ORDER — SODIUM CHLORIDE 0.9 % IV SOLN: INTRAVENOUS | Status: DC

## 2013-01-26 MED ORDER — ACETAMINOPHEN 325 MG PO TABS: 650.0000 mg | ORAL_TABLET | Freq: Four times a day (QID) | ORAL | Status: DC | PRN

## 2013-01-26 MED ORDER — OXYCODONE HCL 5 MG PO TABS
5.0000 mg | ORAL_TABLET | ORAL | Status: DC | PRN
  Administered 2013-01-27 – 2013-02-03 (×19): 5 mg via ORAL
  Filled 2013-01-26 (×19): qty 1

## 2013-01-26 MED ORDER — HYDROMORPHONE HCL PF 1 MG/ML IJ SOLN
1.0000 mg | Freq: Once | INTRAMUSCULAR | Status: AC
  Administered 2013-01-26: 1 mg via INTRAVENOUS
  Filled 2013-01-26: qty 1

## 2013-01-26 MED ORDER — DOCUSATE SODIUM 100 MG PO CAPS
100.0000 mg | ORAL_CAPSULE | Freq: Two times a day (BID) | ORAL | Status: DC
  Administered 2013-01-26 – 2013-02-01 (×13): 100 mg via ORAL
  Filled 2013-01-26 (×15): qty 1

## 2013-01-26 MED ORDER — CALCIUM CARBONATE-VITAMIN D 500-200 MG-UNIT PO TABS
1.0000 | ORAL_TABLET | Freq: Every day | ORAL | Status: DC
  Administered 2013-01-26 – 2013-01-27 (×2): 1 via ORAL
  Filled 2013-01-26 (×3): qty 1

## 2013-01-26 MED ORDER — BUDESONIDE-FORMOTEROL FUMARATE 160-4.5 MCG/ACT IN AERO
2.0000 | INHALATION_SPRAY | Freq: Two times a day (BID) | RESPIRATORY_TRACT | Status: DC
  Administered 2013-01-26 – 2013-01-27 (×3): 2 via RESPIRATORY_TRACT
  Filled 2013-01-26: qty 6

## 2013-01-26 MED ORDER — OLOPATADINE HCL 0.1 % OP SOLN
1.0000 [drp] | Freq: Every day | OPHTHALMIC | Status: DC
  Administered 2013-01-26 – 2013-02-04 (×10): 1 [drp] via OPHTHALMIC
  Filled 2013-01-26: qty 5

## 2013-01-26 MED ORDER — IOHEXOL 300 MG/ML  SOLN
50.0000 mL | Freq: Once | INTRAMUSCULAR | Status: AC | PRN
  Administered 2013-01-26: 50 mL via ORAL

## 2013-01-26 MED ORDER — DULOXETINE HCL 60 MG PO CPEP
60.0000 mg | ORAL_CAPSULE | Freq: Every day | ORAL | Status: DC
  Administered 2013-01-26 – 2013-02-04 (×10): 60 mg via ORAL
  Filled 2013-01-26 (×10): qty 1

## 2013-01-26 MED ORDER — METRONIDAZOLE IN NACL 5-0.79 MG/ML-% IV SOLN
500.0000 mg | Freq: Three times a day (TID) | INTRAVENOUS | Status: DC
  Administered 2013-01-27 – 2013-02-01 (×15): 500 mg via INTRAVENOUS
  Filled 2013-01-26 (×18): qty 100

## 2013-01-26 MED ORDER — HYDROMORPHONE HCL PF 1 MG/ML IJ SOLN
0.5000 mg | INTRAMUSCULAR | Status: DC | PRN
  Administered 2013-01-27 – 2013-01-30 (×5): 0.5 mg via INTRAVENOUS
  Filled 2013-01-26 (×5): qty 1

## 2013-01-26 MED ORDER — ONDANSETRON HCL 4 MG/2ML IJ SOLN
4.0000 mg | Freq: Four times a day (QID) | INTRAMUSCULAR | Status: DC | PRN
  Administered 2013-01-27 – 2013-01-28 (×3): 4 mg via INTRAVENOUS
  Filled 2013-01-26 (×4): qty 2

## 2013-01-26 MED ORDER — CIPROFLOXACIN IN D5W 400 MG/200ML IV SOLN
400.0000 mg | Freq: Once | INTRAVENOUS | Status: AC
  Administered 2013-01-26: 400 mg via INTRAVENOUS
  Filled 2013-01-26: qty 200

## 2013-01-26 MED ORDER — POTASSIUM CHLORIDE IN NACL 40-0.9 MEQ/L-% IV SOLN
INTRAVENOUS | Status: DC
  Administered 2013-01-26: 18:00:00 via INTRAVENOUS
  Filled 2013-01-26 (×5): qty 1000

## 2013-01-26 MED ORDER — ONDANSETRON HCL 4 MG PO TABS
4.0000 mg | ORAL_TABLET | Freq: Four times a day (QID) | ORAL | Status: DC | PRN
  Administered 2013-01-27: 4 mg via ORAL
  Filled 2013-01-26: qty 1

## 2013-01-26 MED ORDER — SODIUM CHLORIDE 0.9 % IV BOLUS (SEPSIS)
500.0000 mL | Freq: Once | INTRAVENOUS | Status: AC
  Administered 2013-01-26: 500 mL via INTRAVENOUS

## 2013-01-26 MED ORDER — CYCLOSPORINE 0.05 % OP EMUL
1.0000 [drp] | Freq: Two times a day (BID) | OPHTHALMIC | Status: DC
  Administered 2013-01-26 – 2013-02-04 (×17): 1 [drp] via OPHTHALMIC
  Filled 2013-01-26 (×20): qty 1

## 2013-01-26 MED ORDER — METRONIDAZOLE IN NACL 5-0.79 MG/ML-% IV SOLN
500.0000 mg | Freq: Once | INTRAVENOUS | Status: AC
  Administered 2013-01-26: 500 mg via INTRAVENOUS
  Filled 2013-01-26: qty 100

## 2013-01-26 MED ORDER — METOPROLOL TARTRATE 50 MG PO TABS
50.0000 mg | ORAL_TABLET | Freq: Two times a day (BID) | ORAL | Status: DC
  Administered 2013-01-26 – 2013-02-04 (×18): 50 mg via ORAL
  Filled 2013-01-26 (×19): qty 1

## 2013-01-26 NOTE — ED Notes (Addendum)
Per pt: pt has had very little diarrhea- s/s started after eating 3 day old left overs.  D/C enteric precautions.

## 2013-01-26 NOTE — ED Notes (Signed)
Pt c/o lower abdominal pain and n/v/d, since last night.  Pt sts dull ache.  Pain score 5/10.  Not actively vomiting.

## 2013-01-26 NOTE — Progress Notes (Signed)
UR completed 

## 2013-01-26 NOTE — ED Notes (Signed)
MD at bedside. 

## 2013-01-26 NOTE — ED Notes (Signed)
MD at bedside.  Dr. Rama 

## 2013-01-26 NOTE — ED Provider Notes (Signed)
History     CSN: [id number]  Arrival date & time 01/26/13  7829   First MD Initiated Contact with Patient 01/26/13 1032      Chief Complaint  Patient presents with  . Abdominal Pain  . Emesis  . Diarrhea    (Consider location/radiation/quality/duration/timing/severity/associated sxs/prior treatment) HPI Patient complaining of nausea, vomiting,vomited twice food began last night.  Abdominal pain at same time.  Diffusely lower abdomen.  Describes as severe ache which hurts more with movement.  Patient without bowel movement.  No fever, possibly chills.  No similar events in past, history of bilateral hernia repair.  Some urinary frequency but not new.  Lives alone.  Daughter here.  Patient drinking water without vomiting.  No other po intake.  PMD- Dr. Valentina Lucks Past Medical History  Diagnosis Date  . Lung cancer 2007    hx stage 1 BAC/ RML. s/p wedge resection from RML, superior segment RLL resection s/p seed implants  . Mycobacterial disease   . Paroxysmal atrial fibrillation   . Bronchiectasis     bronch 10/2009 w/ stenotrophomonas sens to levaquin and bactrim  . Tachycardia-bradycardia syndrome 2008    sp PPM by Dr Amil Amen  . Osteoporosis   . Hypertension   . Hyperlipidemia   . Multinodular goiter (nontoxic)   . Glaucoma   . Anxiety   . Sciatica     Past Surgical History  Procedure Laterality Date  . Wedge resection  2007    for lung CA  . Pacemaker insertion  5/13    MDT implanted by Dr Amil Amen  . Laminectomy  1988  . Hernia repair  1988    Family History  Problem Relation Age of Onset  . Cancer      History  Substance Use Topics  . Smoking status: Former Smoker -- 0.50 packs/day for 55 years    Types: Cigarettes    Quit date: 05/20/2005  . Smokeless tobacco: Never Used  . Alcohol Use: No    OB History   Grav Para Term Preterm Abortions TAB SAB Ect Mult Living                  Review of Systems  All other systems reviewed and are  negative.    Allergies  Review of patient's allergies indicates no known allergies.  Home Medications   Current Outpatient Rx  Name  Route  Sig  Dispense  Refill  . amiodarone (PACERONE) 200 MG tablet   Oral   Take 100 mg by mouth daily.          Marland Kitchen amLODipine (NORVASC) 5 MG tablet   Oral   Take 5 mg by mouth daily.          Marland Kitchen aspirin EC 81 MG tablet   Oral   Take 81 mg by mouth at bedtime.          . budesonide-formoterol (SYMBICORT) 160-4.5 MCG/ACT inhaler   Inhalation   Inhale 2 puffs into the lungs 2 (two) times daily as needed (for shortness of breath).         . Calcium Carbonate-Vitamin D (CALCIUM 500/D) 500-125 MG-UNIT TABS   Oral   Take 1 tablet by mouth daily.          . cycloSPORINE (RESTASIS) 0.05 % ophthalmic emulsion   Both Eyes   Place 1 drop into both eyes 2 (two) times daily.          . DULoxetine (CYMBALTA) 60 MG capsule  Oral   Take 60 mg by mouth daily.         Marland Kitchen HYDROcodone-acetaminophen (NORCO/VICODIN) 5-325 MG per tablet   Oral   Take 1 tablet by mouth every 8 (eight) hours as needed for pain.         Marland Kitchen latanoprost (XALATAN) 0.005 % ophthalmic solution   Both Eyes   Place 1 drop into both eyes at bedtime.          . metoprolol (LOPRESSOR) 50 MG tablet   Oral   Take 50 mg by mouth 2 (two) times daily.           . Multiple Vitamin (MULTIVITAMIN WITH MINERALS) TABS   Oral   Take 1 tablet by mouth daily.         Marland Kitchen neomycin-polymyxin b-dexamethasone (MAXITROL) 3.5-10000-0.1 SUSP   Topical   Apply 1 drop topically as directed. Uses for 5 days every month, applied to eyelids.         . Olopatadine HCl (PATADAY) 0.2 % SOLN   Both Eyes   Place 1 drop into both eyes daily.         Marland Kitchen omega-3 acid ethyl esters (LOVAZA) 1 G capsule   Oral   Take 1 g by mouth daily.         Marland Kitchen zolpidem (AMBIEN) 10 MG tablet   Oral   Take 10 mg by mouth at bedtime as needed for sleep.            BP 146/62  Pulse 86   Temp(Src) 97.8 F (36.6 C) (Axillary)  Resp 16  SpO2 93%  Physical Exam  Nursing note and vitals reviewed. Constitutional: She is oriented to person, place, and time. She appears well-developed and well-nourished.  HENT:  Head: Normocephalic.  Mm dry  Eyes: Conjunctivae are normal. Pupils are equal, round, and reactive to light.  Neck: Normal range of motion.  Cardiovascular: Normal rate and regular rhythm.   Pulmonary/Chest: Effort normal and breath sounds normal.  Abdominal: Soft.  Diffuse abdominal ttp greates rlq  Musculoskeletal: Normal range of motion.  Neurological: She is alert and oriented to person, place, and time.  Skin: Skin is warm and dry.  Psychiatric: She has a normal mood and affect.    ED Course  Procedures (including critical care time)  Labs Reviewed - No data to display No results found.   No diagnosis found.   WBC 22,470 Sodium 132 Creatinine normal  CT abdomen pending   Pain medicine improved symptoms but continues to have some lower abdominal "soreness" 4/10 was 6/10 Iv ns bolus in and vital signs stable but has not voided since bolus, plan additional 500 cc. Filed Vitals:   01/26/13 1346  BP: 124/60  Pulse: 89  Temp:   Resp: 18    Ct Abdomen Pelvis W Contrast  01/26/2013  *RADIOLOGY REPORT*  Clinical Data: Abdominal pain.  History of lung cancer.  CT ABDOMEN AND PELVIS WITH CONTRAST  Technique:  Multidetector CT imaging of the abdomen and pelvis was performed following the standard protocol during bolus administration of intravenous contrast.  Contrast: 80mL OMNIPAQUE IOHEXOL 300 MG/ML  SOLN  Comparison: CT abdomen and pelvis 09/18/2004.  Findings: There is scarring in the lung bases without interval change.  There is cardiomegaly.  No pleural or pericardial effusion.  There is a tiny hypoattenuating lesion in the right hepatic lobe on image 22 measuring 0.4 cm which likely represents a cyst.  A second 0.4 cm cyst is  seen in the inferior  right hepatic lobe on image 32. The liver is otherwise unremarkable.  A few tiny stones are seen within the fundus of the gallbladder.  The gallbladder otherwise appears normal.  Bilateral renal cysts are noted.  The right kidney is ptotic.  The adrenal glands, spleen and pancreas appear normal.  There is a 4.7 x 4.0 x 5.0 cm cystic lesion in the left adnexa which is new since the prior examination.  A 1 cm cystic lesion is seen in the right ovary.  Uterus appears normal.  The patient has sigmoid diverticular disease with surrounding inflammatory change.  Along the mid sigmoid, there is a collection worrisome for diverticular abscess measuring 3.4 cm cranial-caudal by 2.7 cm transverse by 2.8 cm AP.  The colon is otherwise unremarkable.  Stomach and small bowel appear normal.  Marked right convex scoliosis is noted.  No lytic or sclerotic lesion is identified.  The patient is status post lower lumbar decompression.  IMPRESSION:  1.  Diverticulitis with a fluid collection suspicious for small diverticular abscess measuring a maximum of 3.4 cm in diameter. 2.  New cystic lesion in the left ovary.  Non emergent pelvic ultrasound follow-up is recommended. 3.  A few small gallstones are identified without cholecystitis. 4.  Cardiomegaly.   Original Report Authenticated By: Holley Dexter, M.D.    MDM      CT reviewed and significant for diverticulitis with suspicion for abscess. Patient prescribed IV Cipro and Flagyl. She has been more comfortable but continues to have considerable pain and has had vomiting and white blood cell count 22,470. She will be admitted for further pain management, IV antibiotics, and consideration for drainage of abscess.  Discussed with Dr. Darnelle Catalan and I will place a temporary admission orders for a telemetry bed  Hilario Quarry, MD 01/26/13 1531

## 2013-01-26 NOTE — H&P (Signed)
[facility] History and Physical  [name] ZOX:[id number] DOB: October 10, 1926 DOA: 01/26/2013  Referring physician: Dr. Margarita Grizzle. PCP: Lillia Mountain, MD   Chief Complaint: Abdominal pain.   History of Present Illness: [name] is an 79 y.o. female who presented to the ER with abdominal pain that began suddenly at 1:30 a.m.  She was unable to sleep secondary to the pain.  She states the pain increased in intensity until she was given pain medication.  The pain is described as an "ache", constant, rated a 8/10 at its worst, currently a 4/10 after receiving dilaudid.  Touching the affected area makes the pain worse.  Pain has been associated with 2 episodes of vomiting.  No diarrhea.  Review of Systems: Constitutional: + fever, no chills;  Appetite normal; No weight loss, no weight gain.  HEENT: No blurry vision, no diplopia, no pharyngitis, no dysphagia CV: No chest pain, no palpitations.  Resp: + SOB, + dry cough. GI: + nausea, & vomiting, no diarrhea, no melena, no hematochezia.  GU: No dysuria, no hematuria.  MSK: no myalgias, + chronic arthralgias.  Neuro:  + headache, no focal neurological deficits, no history of seizures.  Psych: No depression, no anxiety.  Endo: + thyroid disease (goiter), no DM, no heat intolerance, no cold intolerance, no polyuria, no polydipsia  Skin: No rashes, no skin lesions.  Heme: No easy bruising, no history of blood diseases.  Past Medical History Past Medical History  Diagnosis Date  . Lung cancer 2007    hx stage 1 BAC/ RML. s/p wedge resection from RML, superior segment RLL resection s/p seed implants  . Mycobacterial disease   . Paroxysmal atrial fibrillation   . Bronchiectasis     bronch 10/2009 w/ stenotrophomonas sens to levaquin and bactrim  . Tachycardia-bradycardia syndrome 2008    sp PPM by Dr Amil Amen  . Osteoporosis   . Hypertension   . Hyperlipidemia   . Multinodular goiter (nontoxic)   . Glaucoma   . Anxiety    . Sciatica   . Colonic diverticular abscess 01/26/2013     Past Surgical History Past Surgical History  Procedure Laterality Date  . Wedge resection  2007    for lung CA  . Pacemaker insertion  5/13    MDT implanted by Dr Amil Amen  . Laminectomy  1988  . Hernia repair  1988     Social History: History   Social History  . Marital Status: Widowed    Spouse Name: N/A    Number of Children: 4  . Years of Education: N/A   Occupational History  . Homemaker    Social History Main Topics  . Smoking status: Former Smoker -- 0.50 packs/day for 55 years    Types: Cigarettes    Quit date: 05/20/2005  . Smokeless tobacco: Never Used  . Alcohol Use: No  . Drug Use: No  . Sexually Active: No   Other Topics Concern  . Not on file   Social History Narrative   Widowed.  Lives alone.   Lives in [place].   Ambulates independently.             Family History:  Family History  Problem Relation Age of Onset  . Cancer Sister   . Heart failure Father   . Diabetes Mother     Allergies: Review of patient's allergies indicates no known allergies.  Meds: Prior to Admission medications   Medication Sig Start Date End Date Taking? Authorizing Provider  amiodarone (PACERONE) 200 MG tablet Take 100 mg by mouth daily.    Yes Historical Provider, MD  amLODipine (NORVASC) 5 MG tablet Take 5 mg by mouth daily.    Yes Historical Provider, MD  aspirin EC 81 MG tablet Take 81 mg by mouth at bedtime.    Yes Historical Provider, MD  budesonide-formoterol (SYMBICORT) 160-4.5 MCG/ACT inhaler Inhale 2 puffs into the lungs 2 (two) times daily as needed (for shortness of breath).   Yes Historical Provider, MD  Calcium Carbonate-Vitamin D (CALCIUM 500/D) 500-125 MG-UNIT TABS Take 1 tablet by mouth daily.    Yes Historical Provider, MD  cycloSPORINE (RESTASIS) 0.05 % ophthalmic emulsion Place 1 drop into both eyes 2 (two) times daily.    Yes Historical Provider, MD  DULoxetine (CYMBALTA) 60  MG capsule Take 60 mg by mouth daily. 02/26/11  Yes Historical Provider, MD  HYDROcodone-acetaminophen (NORCO/VICODIN) 5-325 MG per tablet Take 1 tablet by mouth every 8 (eight) hours as needed for pain.   Yes Historical Provider, MD  latanoprost (XALATAN) 0.005 % ophthalmic solution Place 1 drop into both eyes at bedtime.    Yes Historical Provider, MD  metoprolol (LOPRESSOR) 50 MG tablet Take 50 mg by mouth 2 (two) times daily.     Yes Historical Provider, MD  Multiple Vitamin (MULTIVITAMIN WITH MINERALS) TABS Take 1 tablet by mouth daily.   Yes Historical Provider, MD  neomycin-polymyxin b-dexamethasone (MAXITROL) 3.5-10000-0.1 SUSP Apply 1 drop topically as directed. Uses for 5 days every month, applied to eyelids.   Yes Historical Provider, MD  Olopatadine HCl (PATADAY) 0.2 % SOLN Place 1 drop into both eyes daily.   Yes Historical Provider, MD  omega-3 acid ethyl esters (LOVAZA) 1 G capsule Take 1 g by mouth daily.   Yes Historical Provider, MD  zolpidem (AMBIEN) 10 MG tablet Take 10 mg by mouth at bedtime as needed for sleep.    Yes Historical Provider, MD    Physical Exam: Filed Vitals:   01/26/13 1229 01/26/13 1346 01/26/13 1542 01/26/13 1645  BP:  124/60 138/58   Pulse:  89 84 89  Temp:   98.4 F (36.9 C)   TempSrc:   Oral   Resp:  18 18   Height:      Weight:      SpO2: 98% 96% 99% 94%     Physical Exam: Blood pressure 138/58, pulse 89, temperature 98.4 F (36.9 C), temperature source Oral, resp. rate 18, height 5' (1.524 m), weight 52.164 kg (115 lb), SpO2 94.00%. Gen: No acute distress. Head: Normocephalic, atraumatic. Eyes: PERRL, EOMI, sclerae nonicteric. Mouth: Oropharynx clear with dry mucous membranes and a white coating to the tong. Neck: Supple, + thyromegaly, no lymphadenopathy, no jugular venous distention. Chest: Lungs diminished with a few expiratory wheezes and crackles. CV: Heart sounds are regular. No murmurs, rubs, or gallops. Abdomen: Soft, tender to  the lower abdomen, nondistended with normal active bowel sounds. Extremities: Extremities are without clubbing, edema, or cyanosis. Skin: Warm and dry. Neuro: Alert and oriented times 3; cranial nerves II through XII grossly intact. Psych: Mood and affect normal.  Labs on Admission:  Basic Metabolic Panel:  Recent Labs Lab 01/26/13 1214  NA 134*  K 3.2*  CL 93*  CO2 29  GLUCOSE 132*  BUN 19  CREATININE 0.75  CALCIUM 9.3   Liver Function Tests:  Recent Labs Lab 01/26/13 1214  AST 30  ALT 19  ALKPHOS 71  BILITOT 0.8  PROT 7.3  ALBUMIN 3.5  Recent Labs Lab 01/26/13 1214  LIPASE 14   CBC:  Recent Labs Lab 01/26/13 1214  WBC 22.5*  NEUTROABS 21.1*  HGB 13.8  HCT 41.5  MCV 88.9  PLT 321    Radiological Exams on Admission: Ct Abdomen Pelvis W Contrast  01/26/2013  *RADIOLOGY REPORT*  Clinical Data: Abdominal pain.  History of lung cancer.  CT ABDOMEN AND PELVIS WITH CONTRAST  Technique:  Multidetector CT imaging of the abdomen and pelvis was performed following the standard protocol during bolus administration of intravenous contrast.  Contrast: 80mL OMNIPAQUE IOHEXOL 300 MG/ML  SOLN  Comparison: CT abdomen and pelvis 09/18/2004.  Findings: There is scarring in the lung bases without interval change.  There is cardiomegaly.  No pleural or pericardial effusion.  There is a tiny hypoattenuating lesion in the right hepatic lobe on image 22 measuring 0.4 cm which likely represents a cyst.  A second 0.4 cm cyst is seen in the inferior right hepatic lobe on image 32. The liver is otherwise unremarkable.  A few tiny stones are seen within the fundus of the gallbladder.  The gallbladder otherwise appears normal.  Bilateral renal cysts are noted.  The right kidney is ptotic.  The adrenal glands, spleen and pancreas appear normal.  There is a 4.7 x 4.0 x 5.0 cm cystic lesion in the left adnexa which is new since the prior examination.  A 1 cm cystic lesion is seen in the right  ovary.  Uterus appears normal.  The patient has sigmoid diverticular disease with surrounding inflammatory change.  Along the mid sigmoid, there is a collection worrisome for diverticular abscess measuring 3.4 cm cranial-caudal by 2.7 cm transverse by 2.8 cm AP.  The colon is otherwise unremarkable.  Stomach and small bowel appear normal.  Marked right convex scoliosis is noted.  No lytic or sclerotic lesion is identified.  The patient is status post lower lumbar decompression.  IMPRESSION:  1.  Diverticulitis with a fluid collection suspicious for small diverticular abscess measuring a maximum of 3.4 cm in diameter. 2.  New cystic lesion in the left ovary.  Non emergent pelvic ultrasound follow-up is recommended. 3.  A few small gallstones are identified without cholecystitis. 4.  Cardiomegaly.   Original Report Authenticated By: Holley Dexter, M.D.     Assessment/Plan Principal Problem:   Sepsis secondary to colonic diverticular abscess -CT scan of the abdomen confirms diverticulitis with a abscess measuring 3.4 cm in diameter. -Place on empiric Cipro/Flagyl. -May need a repeat CT scan in 2-3 days to ensure abscess size is decreasing. If not, may need to have it percutaneously drained. -Pain and nausea control with IV medications. Active Problems:   FIBRILLATION, ATRIAL STATUS post pacemaker-[employer] -Continue aspirin therapy. Heart sounds are regular and rate controlled.   BRONCHIECTASIS / COPD -The patient has chronic dyspnea. Continue Symbicort.   Hypokalemia -From GI losses. Potassium added to IV fluids.   Hyperglycemia -Not a fasting sample. Recheck in the morning.   Cystic lesion left ovary  -Will need a transvaginal or pelvic ultrasound scheduled for further evaluation which can be done non-emergently.  Code Status: Full. Family Communication: Blinda Leatherwood (daughter): [phone]. Disposition Plan: Home when stable.  Time spent: 65 minutes.  Perley Arthurs [facility] Pager [phone]  If 7PM-7AM, please contact night-coverage [email or web address] Password [facility] 01/26/2013, 4:57 PM

## 2013-01-27 DIAGNOSIS — K5732 Diverticulitis of large intestine without perforation or abscess without bleeding: Principal | ICD-10-CM

## 2013-01-27 DIAGNOSIS — E876 Hypokalemia: Secondary | ICD-10-CM | POA: Diagnosis not present

## 2013-01-27 DIAGNOSIS — D72829 Elevated white blood cell count, unspecified: Secondary | ICD-10-CM | POA: Diagnosis present

## 2013-01-27 DIAGNOSIS — D649 Anemia, unspecified: Secondary | ICD-10-CM | POA: Diagnosis not present

## 2013-01-27 LAB — CBC
HCT: 35.1 % — ABNORMAL LOW (ref 36.0–46.0)
Platelets: 253 10*3/uL (ref 150–400)
RBC: 3.87 MIL/uL (ref 3.87–5.11)
RDW: 14.5 % (ref 11.5–15.5)
WBC: 17.1 10*3/uL — ABNORMAL HIGH (ref 4.0–10.5)

## 2013-01-27 LAB — BASIC METABOLIC PANEL
CO2: 28 mEq/L (ref 19–32)
Chloride: 102 mEq/L (ref 96–112)
Creatinine, Ser: 0.81 mg/dL (ref 0.50–1.10)
GFR calc Af Amer: 76 mL/min — ABNORMAL LOW (ref >90)
Sodium: 137 mEq/L (ref 135–145)

## 2013-01-27 MED ORDER — LIP MEDEX EX OINT
TOPICAL_OINTMENT | CUTANEOUS | Status: DC | PRN
  Filled 2013-01-27: qty 7

## 2013-01-27 MED ORDER — POTASSIUM CHLORIDE CRYS ER 20 MEQ PO TBCR
40.0000 meq | EXTENDED_RELEASE_TABLET | Freq: Once | ORAL | Status: AC
  Administered 2013-01-27: 40 meq via ORAL
  Filled 2013-01-27: qty 2

## 2013-01-27 NOTE — Progress Notes (Addendum)
[facility] PROGRESS NOTE  [name] ZOX:[id number] DOB: [birth year] DOA: 01/26/2013 PCP: Lillia Mountain, MD  Brief narrative 79 year old female patient with history of PAF, tachybradycardia syndrome status post PPM, HTN, HL, lung cancer was admitted on 01/26/13 with history of abdominal pain and vomiting. CT abdomen shows diverticulitis with fluid collection suspicious for small diverticular abscess measuring maximum of 3.4 cm in diameter. Hospitalist admission requested.  Assessment/Plan: 1. Acute sigmoid diverticulitis and small diverticular abscess (3.4 cm diameter): Patient continues to have abdominal pain. Downgrade diet to clear liquids. Continue IV Cipro and Flagyl. Abdominal exam does not suggest acute abdomen. Monitor clinically. Consider repeating imaging in a few days. May consider surgical consultation if patient declines. Patient does not seem to remember having a colonoscopy which she will need after resolution of acute phase. Pain management. 2. Hypokalemia: Orally replete and follow BMP 3. Leukocytosis: Secondary to problem #1. Improving. Follow daily CBCs. 4. Anemia: Secondary to acute illness versus dilution. Follow CBC in a.m. 5. PAF/PPM: Atrial paced rhythm on monitor. Continue amiodarone, metoprolol and aspirin. 6. COPD/bronchiectasis: Not on home oxygen. Stable. 7. Cystic lesion left ovary on CT: OP non-emergent transvaginal and pelvic ultrasound. 8. HTN: Controlled. 9. Cholelithiasis:  Code Status: Full Family Communication: Discussed with patient. Discussed with daughter Ms. Blinda Leatherwood via phone. Disposition Plan: Remains inpatient. Home when medically stable. Left message for Dr. Valentina Lucks to take over in patient care from 5/15.   Consultants:  None  Procedures:  None  Antibiotics:  IV Cipro 5/13 >  IV Flagyl 5/13 >   HPI/Subjective: Lower abdominal pain-not significantly changed compared to admission. No nausea or vomiting. Passing  flatus.  Objective: Filed Vitals:   01/27/13 0006 01/27/13 0636 01/27/13 0758 01/27/13 1400  BP: 106/54 126/61  126/34  Pulse: 73 81  85  Temp: 97.7 F (36.5 C) 99.2 F (37.3 C)  99.2 F (37.3 C)  TempSrc: Oral Oral  Oral  Resp: 28 32  18  Height:      Weight:      SpO2: 99% 92% 95% 93%    Intake/Output Summary (Last 24 hours) at 01/27/13 1553 Last data filed at 01/27/13 1030  Gross per 24 hour  Intake   1235 ml  Output    400 ml  Net    835 ml   Filed Weights   01/26/13 1224 01/26/13 1722  Weight: 52.164 kg (115 lb) 53.6 kg (118 lb 2.7 oz)    Exam:   General exam: Sitting up at the edge of bed eating breakfast. Appears comfortable and pleasant.  Respiratory system: Slightly reduced breath sounds in bases with occasional basal crackles but rest of lung fields clear to auscultation. No increased work of breathing.  Cardiovascular system: S1 & S2 heard, RRR. No JVD, murmurs, gallops, clicks or pedal edema. Telemetry: A paced rhythm.  Gastrointestinal system: Abdomen is nondistended, soft and tenderness in the lower quadrants without rigidity, guarding or rebound.. Normal bowel sounds heard.  Central nervous system: Alert and oriented. No focal neurological deficits.  Extremities: Symmetric 5 x 5 power.   Data Reviewed: Basic Metabolic Panel:  Recent Labs Lab 01/26/13 1214 01/27/13 0448  NA 134* 137  K 3.2* 3.4*  CL 93* 102  CO2 29 28  GLUCOSE 132* 125*  BUN 19 18  CREATININE 0.75 0.81  CALCIUM 9.3 8.1*   Liver Function Tests:  Recent Labs Lab 01/26/13 1214  AST 30  ALT 19  ALKPHOS 71  BILITOT 0.8  PROT 7.3  ALBUMIN 3.5    Recent Labs Lab 01/26/13 1214  LIPASE 14   No results found for this basename: AMMONIA,  in the last 168 hours CBC:  Recent Labs Lab 01/26/13 1214 01/27/13 0448  WBC 22.5* 17.1*  NEUTROABS 21.1*  --   HGB 13.8 11.2*  HCT 41.5 35.1*  MCV 88.9 90.7  PLT 321 253   Cardiac Enzymes: No results found for this  basename: CKTOTAL, CKMB, CKMBINDEX, TROPONINI,  in the last 168 hours BNP (last 3 results) No results found for this basename: PROBNP,  in the last 8760 hours CBG: No results found for this basename: GLUCAP,  in the last 168 hours  No results found for this or any previous visit (from the past 240 hour(s)).   Studies: Ct Abdomen Pelvis W Contrast  01/26/2013   *RADIOLOGY REPORT*  Clinical Data: Abdominal pain.  History of lung cancer.  CT ABDOMEN AND PELVIS WITH CONTRAST  Technique:  Multidetector CT imaging of the abdomen and pelvis was performed following the standard protocol during bolus administration of intravenous contrast.  Contrast: 80mL OMNIPAQUE IOHEXOL 300 MG/ML  SOLN  Comparison: CT abdomen and pelvis 09/18/2004.  Findings: There is scarring in the lung bases without interval change.  There is cardiomegaly.  No pleural or pericardial effusion.  There is a tiny hypoattenuating lesion in the right hepatic lobe on image 22 measuring 0.4 cm which likely represents a cyst.  A second 0.4 cm cyst is seen in the inferior right hepatic lobe on image 32. The liver is otherwise unremarkable.  A few tiny stones are seen within the fundus of the gallbladder.  The gallbladder otherwise appears normal.  Bilateral renal cysts are noted.  The right kidney is ptotic.  The adrenal glands, spleen and pancreas appear normal.  There is a 4.7 x 4.0 x 5.0 cm cystic lesion in the left adnexa which is new since the prior examination.  A 1 cm cystic lesion is seen in the right ovary.  Uterus appears normal.  The patient has sigmoid diverticular disease with surrounding inflammatory change.  Along the mid sigmoid, there is a collection worrisome for diverticular abscess measuring 3.4 cm cranial-caudal by 2.7 cm transverse by 2.8 cm AP.  The colon is otherwise unremarkable.  Stomach and small bowel appear normal.  Marked right convex scoliosis is noted.  No lytic or sclerotic lesion is identified.  The patient is status  post lower lumbar decompression.  IMPRESSION:  1.  Diverticulitis with a fluid collection suspicious for small diverticular abscess measuring a maximum of 3.4 cm in diameter. 2.  New cystic lesion in the left ovary.  Non emergent pelvic ultrasound follow-up is recommended. 3.  A few small gallstones are identified without cholecystitis. 4.  Cardiomegaly.   Original Report Authenticated By: Holley Dexter, M.D.     Additional labs:   Scheduled Meds: . amiodarone  100 mg Oral Daily  . amLODipine  5 mg Oral Daily  . aspirin EC  81 mg Oral QHS  . budesonide-formoterol  2 puff Inhalation BID  . calcium-vitamin D  1 tablet Oral Daily  . ciprofloxacin  400 mg Intravenous Q12H  . cycloSPORINE  1 drop Both Eyes BID  . docusate sodium  100 mg Oral BID  . DULoxetine  60 mg Oral Daily  . enoxaparin (LOVENOX) injection  40 mg Subcutaneous Q24H  . latanoprost  1 drop Both Eyes QHS  . metoprolol  50 mg Oral BID  . metronidazole  500 mg  Intravenous Q8H  . multivitamin with minerals  1 tablet Oral Daily  . olopatadine  1 drop Both Eyes Daily  . omega-3 acid ethyl esters  1 g Oral Daily  . sodium chloride  3 mL Intravenous Q12H   Continuous Infusions: . 0.9 % NaCl with KCl 40 mEq / L 75 mL/hr at 01/26/13 1820    Principal Problem:   Sepsis Active Problems:   FIBRILLATION, ATRIAL   BRONCHIECTASIS   COPD   Pacemaker-[employer]   Colonic diverticular abscess   Hypokalemia   Hyperglycemia   Hypertension   Hyperlipidemia   Cystic lesion left ovary    Time spent: 40 minutes    [facility]  [facility] Pager [phone].   If 8PM-8AM, please contact night-coverage at [email or web address], password [facility] 01/27/2013, 3:53 PM  LOS: 1 day

## 2013-01-28 ENCOUNTER — Inpatient Hospital Stay (HOSPITAL_COMMUNITY): Payer: Medicare Other

## 2013-01-28 DIAGNOSIS — R0989 Other specified symptoms and signs involving the circulatory and respiratory systems: Secondary | ICD-10-CM | POA: Diagnosis not present

## 2013-01-28 DIAGNOSIS — J449 Chronic obstructive pulmonary disease, unspecified: Secondary | ICD-10-CM | POA: Diagnosis not present

## 2013-01-28 LAB — CBC
HCT: 35.5 % — ABNORMAL LOW (ref 36.0–46.0)
Hemoglobin: 11 g/dL — ABNORMAL LOW (ref 12.0–15.0)
MCV: 90.8 fL (ref 78.0–100.0)
WBC: 15.4 10*3/uL — ABNORMAL HIGH (ref 4.0–10.5)

## 2013-01-28 LAB — BASIC METABOLIC PANEL
BUN: 19 mg/dL (ref 6–23)
CO2: 29 mEq/L (ref 19–32)
Chloride: 100 mEq/L (ref 96–112)
GFR calc Af Amer: 69 mL/min — ABNORMAL LOW (ref >90)
Potassium: 5.1 mEq/L (ref 3.5–5.1)

## 2013-01-28 MED ORDER — ALBUTEROL SULFATE (5 MG/ML) 0.5% IN NEBU
2.5000 mg | INHALATION_SOLUTION | Freq: Four times a day (QID) | RESPIRATORY_TRACT | Status: DC
  Administered 2013-01-28 – 2013-02-02 (×20): 2.5 mg via RESPIRATORY_TRACT
  Filled 2013-01-28 (×21): qty 0.5

## 2013-01-28 MED ORDER — PREDNISONE 50 MG PO TABS
60.0000 mg | ORAL_TABLET | Freq: Every day | ORAL | Status: AC
  Administered 2013-01-28 – 2013-01-29 (×2): 60 mg via ORAL
  Filled 2013-01-28 (×4): qty 1

## 2013-01-28 MED ORDER — IPRATROPIUM BROMIDE 0.02 % IN SOLN
0.5000 mg | Freq: Four times a day (QID) | RESPIRATORY_TRACT | Status: DC
  Administered 2013-01-28 – 2013-02-02 (×20): 0.5 mg via RESPIRATORY_TRACT
  Filled 2013-01-28 (×22): qty 2.5

## 2013-01-28 MED ORDER — IPRATROPIUM BROMIDE 0.02 % IN SOLN
0.5000 mg | Freq: Once | RESPIRATORY_TRACT | Status: AC
  Administered 2013-01-28: 0.5 mg via RESPIRATORY_TRACT
  Filled 2013-01-28: qty 2.5

## 2013-01-28 MED ORDER — DEXTROSE-NACL 5-0.45 % IV SOLN
INTRAVENOUS | Status: DC
  Administered 2013-01-28: 20:00:00 via INTRAVENOUS

## 2013-01-28 MED ORDER — ALBUTEROL SULFATE (5 MG/ML) 0.5% IN NEBU
5.0000 mg | INHALATION_SOLUTION | Freq: Once | RESPIRATORY_TRACT | Status: AC
  Administered 2013-01-28: 5 mg via RESPIRATORY_TRACT
  Filled 2013-01-28 (×2): qty 0.5

## 2013-01-28 NOTE — Care Management Note (Addendum)
    Page 1 of 2   02/04/2013     1:50:26 PM   CARE MANAGEMENT NOTE 02/04/2013  Patient:  [name], [name]   Account Number:  [id number]  Date Initiated:  01/28/2013  Documentation initiated by:  Lanier Clam  Subjective/Objective Assessment:   ADMITTED W/SEPSIS.DIVERTICULITIS.     Action/Plan:   FROM HOME ALONE.DTR LIVES 3 DOORS AWAY.   Anticipated DC Date:  02/04/2013   Anticipated DC Plan:  HOME W HOME HEALTH SERVICES      DC Planning Services  CM consult      Choice offered to / List presented to:  C-4 Adult Children        HH arranged  HH-1 RN  IV Antibiotics  HH-2 PT  HH-3 OT      HH agency  [facility]   Status of service:  Completed, signed off Medicare Important Message given?   (If response is "NO", the following Medicare IM given date fields will be blank) Date Medicare IM given:   Date Additional Medicare IM given:    Discharge Disposition:  HOME W HOME HEALTH SERVICES  Per UR Regulation:  Reviewed for med. necessity/level of care/duration of stay  If discussed at Long Length of Stay Meetings, dates discussed:   02/02/2013  02/04/2013    Comments:  02/04/13 Janellie Tennison RN,BSN NCM [phone] [facility] REP MARY CAME TO VISIT PATIENT,AWARE OF D/C TODAY HOME W/HHRN/PT/OT-HHRN IV ABX-RECEIVED DOSE TODAY,IV ABX Q24,PICC IN PLACE,HH ORDERS,W/PICC LINE CARE,DRAIN CARE,HHRN TO MAKE HOME VISIT THIS EVENING.[facility] WILL MANAGE DELIVERY OF MED VIA THEIR PHARMACY,THEIR PHARMACY WILL MANAGE MED-IV INVANZ.  02/03/13 Lanaya Bennis RN,BSN NCM [phone] [facility] CHOSEN FOR HH.TC MARY MANLY [facility] REP TEL#[phone],FOLLOWING FOR HHRN-IV ABX,PT,OT.PICC ORDERED.WILL AWAIT SPECIFIC IV ABX ORDERS,LAB DRAWS.  02/01/13 Cherylann Hobday RN,BSN NCM [phone] ADVANCING DIET,CT ABD/PELVIS.PROVIDED [facility] AGENCY LIST.RECOMMENDED FOR HHPT.DTR SAYS SHE CAN GET A CANE FROM A FAMILY MEMBER.  01/29/13 Mikaele Stecher RN,BSN NCM [phone] AWAIT PT/OT RECOMMENDATIONS.IF NO SX TO  ABSCESS,MAY BE AN LTAC CANDIDATE.IF MD WOULD LIKE AN LTAC REFERRAL PLEASE PUT IN CASE MGMT CONS FOR LTAC.THANKS.  01/28/13 Clifton Kovacic RN,BSN NCM [phone] IR-DRAIN,RESPIRATORY ISSUES.RECOMMEND PT/OT CONS.PROVIDED [facility] AGENCY LIST AS RESOURCE.

## 2013-01-28 NOTE — Progress Notes (Signed)
Pt alert x4 sob with activity shallow breathing noted using accessories musles 02 @ 2lnc  80% increased to 3l [place] 91 % Paged K schorr PA. Orders given for prn nebs will monitor.

## 2013-01-28 NOTE — Progress Notes (Signed)
Stopped to see patient at request of nursing staff. She was sleeping when I arrived and a family member was present. Told them I would come back later when she was awake.

## 2013-01-28 NOTE — Progress Notes (Signed)
Subjective: Pain a little better, more short of breath.  Clearly states that she would not want intubation/mechanical ventilation or CPR/ACLS under any circumstances  Objective: Vital signs in last 24 hours: Temp:  [98.9 F (37.2 C)-99.2 F (37.3 C)] 98.9 F (37.2 C) (05/14 2206) Pulse Rate:  [81-87] 87 (05/14 2206) Resp:  [18-32] 18 (05/14 2206) BP: (110-126)/(34-61) 110/59 mmHg (05/14 2206) SpO2:  [92 %-95 %] 94 % (05/15 0610) Weight change:  Last BM Date: 01/26/13  Intake/Output from previous day: 05/14 0701 - 05/15 0700 In: 1557.5 [P.O.:480; I.V.:877.5; IV Piggyback:200] Out: 1500 [Urine:1500] Intake/Output this shift: Total I/O In: -  Out: 650 [Urine:650]  General appearance: alert and cooperative Resp: wheezes bilaterally Cardio: regular rate and rhythm, S1, S2 normal, no murmur, click, rub or gallop GI: distended, mild tenderness across lower abdomen without rebound Extremities: extremities normal, atraumatic, no cyanosis or edema  Lab Results:  Recent Labs  01/27/13 0448 01/28/13 0441  WBC 17.1* 15.4*  HGB 11.2* 11.0*  HCT 35.1* 35.5*  PLT 253 252   BMET  Recent Labs  01/27/13 0448 01/28/13 0441  NA 137 134*  K 3.4* 5.1  CL 102 100  CO2 28 29  GLUCOSE 125* 128*  BUN 18 19  CREATININE 0.81 0.87  CALCIUM 8.1* 8.7    Studies/Results: Ct Abdomen Pelvis W Contrast  01/26/2013   *RADIOLOGY REPORT*  Clinical Data: Abdominal pain.  History of lung cancer.  CT ABDOMEN AND PELVIS WITH CONTRAST  Technique:  Multidetector CT imaging of the abdomen and pelvis was performed following the standard protocol during bolus administration of intravenous contrast.  Contrast: 80mL OMNIPAQUE IOHEXOL 300 MG/ML  SOLN  Comparison: CT abdomen and pelvis 09/18/2004.  Findings: There is scarring in the lung bases without interval change.  There is cardiomegaly.  No pleural or pericardial effusion.  There is a tiny hypoattenuating lesion in the right hepatic lobe on image 22  measuring 0.4 cm which likely represents a cyst.  A second 0.4 cm cyst is seen in the inferior right hepatic lobe on image 32. The liver is otherwise unremarkable.  A few tiny stones are seen within the fundus of the gallbladder.  The gallbladder otherwise appears normal.  Bilateral renal cysts are noted.  The right kidney is ptotic.  The adrenal glands, spleen and pancreas appear normal.  There is a 4.7 x 4.0 x 5.0 cm cystic lesion in the left adnexa which is new since the prior examination.  A 1 cm cystic lesion is seen in the right ovary.  Uterus appears normal.  The patient has sigmoid diverticular disease with surrounding inflammatory change.  Along the mid sigmoid, there is a collection worrisome for diverticular abscess measuring 3.4 cm cranial-caudal by 2.7 cm transverse by 2.8 cm AP.  The colon is otherwise unremarkable.  Stomach and small bowel appear normal.  Marked right convex scoliosis is noted.  No lytic or sclerotic lesion is identified.  The patient is status post lower lumbar decompression.  IMPRESSION:  1.  Diverticulitis with a fluid collection suspicious for small diverticular abscess measuring a maximum of 3.4 cm in diameter. 2.  New cystic lesion in the left ovary.  Non emergent pelvic ultrasound follow-up is recommended. 3.  A few small gallstones are identified without cholecystitis. 4.  Cardiomegaly.   Original Report Authenticated By: Holley Dexter, M.D.    Medications: I have reviewed the patient's current medications.  Assessment/Plan: 1. Acute sigmoid diverticulitis and small diverticular abscess (3.4 cm diameter): Patient  continues to have abdominal pain. Downgrade diet to clear liquids. Continue IV Cipro and Flagyl.. Consider repeating imaging in a few days. May consider surgical consultation if patient declines. Pain management. 2. Hypokalemia: repleted 3. Leukocytosis: Secondary to problem #1. Improving. Follow daily CBCs. 4. Anemia: Secondary to acute illness versus  dilution. Follow CBC in a.m. 5. PAF/PPM: Atrial paced rhythm on monitor. Continue amiodarone, metoprolol and aspirin. 6. COPD/bronchiectasis: wheezing and dyspnea today, check CXR and BNP and add steroids and bronchodilaters 7. Cystic lesion left ovary on CT: consider OP non-emergent transvaginal and pelvic ultrasound. 8. HTN: Controlled. 9. No Code Blue    LOS: 2 days   [name] 01/28/2013, 6:24 AM

## 2013-01-29 DIAGNOSIS — R0989 Other specified symptoms and signs involving the circulatory and respiratory systems: Secondary | ICD-10-CM | POA: Diagnosis not present

## 2013-01-29 DIAGNOSIS — K630 Abscess of intestine: Secondary | ICD-10-CM | POA: Diagnosis not present

## 2013-01-29 DIAGNOSIS — K5732 Diverticulitis of large intestine without perforation or abscess without bleeding: Secondary | ICD-10-CM | POA: Diagnosis not present

## 2013-01-29 DIAGNOSIS — J441 Chronic obstructive pulmonary disease with (acute) exacerbation: Secondary | ICD-10-CM | POA: Diagnosis not present

## 2013-01-29 LAB — BASIC METABOLIC PANEL
BUN: 19 mg/dL (ref 6–23)
Chloride: 99 mEq/L (ref 96–112)
GFR calc Af Amer: >90 mL/min (ref >90)
GFR calc non Af Amer: 78 mL/min — ABNORMAL LOW (ref >90)
Glucose, Bld: 149 mg/dL — ABNORMAL HIGH (ref 70–99)
Potassium: 4.1 mEq/L (ref 3.5–5.1)
Sodium: 132 mEq/L — ABNORMAL LOW (ref 135–145)

## 2013-01-29 LAB — CBC
HCT: 33.5 % — ABNORMAL LOW (ref 36.0–46.0)
Hemoglobin: 10.5 g/dL — ABNORMAL LOW (ref 12.0–15.0)
MCH: 28.2 pg (ref 26.0–34.0)
MCHC: 31.3 g/dL (ref 30.0–36.0)
RBC: 3.73 MIL/uL — ABNORMAL LOW (ref 3.87–5.11)

## 2013-01-29 MED ORDER — POTASSIUM CHLORIDE CRYS ER 20 MEQ PO TBCR
40.0000 meq | EXTENDED_RELEASE_TABLET | Freq: Once | ORAL | Status: AC
  Administered 2013-01-29: 40 meq via ORAL
  Filled 2013-01-29: qty 2

## 2013-01-29 MED ORDER — LEVOFLOXACIN IN D5W 250 MG/50ML IV SOLN
250.0000 mg | INTRAVENOUS | Status: DC
  Administered 2013-01-30 – 2013-01-31 (×2): 250 mg via INTRAVENOUS
  Filled 2013-01-29 (×3): qty 50

## 2013-01-29 MED ORDER — FUROSEMIDE 10 MG/ML IJ SOLN
40.0000 mg | Freq: Once | INTRAMUSCULAR | Status: AC
  Administered 2013-01-29: 40 mg via INTRAVENOUS
  Filled 2013-01-29: qty 4

## 2013-01-29 MED ORDER — PREDNISONE 20 MG PO TABS
40.0000 mg | ORAL_TABLET | Freq: Every day | ORAL | Status: AC
  Administered 2013-01-30 – 2013-01-31 (×2): 40 mg via ORAL
  Filled 2013-01-29 (×2): qty 2

## 2013-01-29 MED ORDER — LEVOFLOXACIN IN D5W 500 MG/100ML IV SOLN
500.0000 mg | Freq: Once | INTRAVENOUS | Status: AC
  Administered 2013-01-29: 500 mg via INTRAVENOUS
  Filled 2013-01-29: qty 100

## 2013-01-29 NOTE — Progress Notes (Signed)
Telemetry monitor showing a fib with a rate of 120.  This is a change from prior telemetry strips.  12 Lead EKG confirms rhythm.  Pt sitting in chair without any obvious change in symptoms.  MD paged, will continue to monitor.

## 2013-01-29 NOTE — Progress Notes (Signed)
Subjective: Abdominal pain better, confused last night with ambien.  Still wheezing  Objective: Vital signs in last 24 hours: Temp:  [98.2 F (36.8 C)-98.5 F (36.9 C)] 98.5 F (36.9 C) (05/16 0444) Pulse Rate:  [72-83] 79 (05/16 0444) Resp:  [16-28] 24 (05/16 0444) BP: (119-133)/(63-72) 133/63 mmHg (05/16 0444) SpO2:  [92 %-99 %] 92 % (05/16 0444) Weight change:  Last BM Date: 01/26/13  Intake/Output from previous day: 05/15 0701 - 05/16 0700 In: 2645 [P.O.:480; I.V.:765; IV Piggyback:1400] Out: 2000 [Urine:2000] Intake/Output this shift:    General appearance: alert and cooperative Resp: wheezes bilaterally Cardio: regular rate and rhythm, S1, S2 normal, no murmur, click, rub or gallop GI: soft, mild lower abdominal tenderness, BS present Extremities: extremities normal, atraumatic, no cyanosis or edema  Lab Results:  Recent Labs  01/28/13 0441 01/29/13 0435  WBC 15.4* 13.7*  HGB 11.0* 10.5*  HCT 35.5* 33.5*  PLT 252 307   BMET  Recent Labs  01/28/13 0441 01/29/13 0435  NA 134* 132*  K 5.1 4.1  CL 100 99  CO2 29 25  GLUCOSE 128* 149*  BUN 19 19  CREATININE 0.87 0.69  CALCIUM 8.7 8.8    Studies/Results: Dg Chest Port 1 View  01/28/2013   *RADIOLOGY REPORT*  Clinical Data: Wheezing, shortness of breath, weakness; history lung cancer, bronchiectasis, hypertension, mycobacterial disease  PORTABLE CHEST - 1 VIEW  Comparison: Portable exam 0641 hours compared to 05/28/2012  Findings: Left subclavian sequential transvenous pacemaker leads project over right atrium and right ventricle. Brachytherapy seed implants mid right lung. Enlargement of cardiac silhouette with pulmonary vascular congestion. Atherosclerotic calcification aorta. Volume loss right hemithorax with mediastinal shift to the right. Emphysematous changes with minimal peripheral pleural based opacity in the right hemithorax likely a partially loculated pleural effusion. Right basilar scarring again  seen. Patchy infiltrates are seen in the left mid lung and more diffusely in the right lung. No pneumothorax or acute osseous findings.  IMPRESSION: Enlargement of cardiac silhouette with pulmonary vascular congestion. COPD with right basilar scarring. Patchy infiltrates right greater than left lungs with suspect associated partially loculated right pleural effusion; findings could be due to infection or edema.   Original Report Authenticated By: Ulyses Southward, M.D.    Medications: I have reviewed the patient's current medications.  Assessment/Plan: 1. Acute sigmoid diverticulitis and small diverticular abscess (3.4 cm diameter): Patient clinically improved with less pain and decreased WBC. diet to full liquids. Continue IV quinolone and Flagyl day 4.. Consider repeating imaging in a few days.  Pain management. 2. Hypokalemia: repleted 3. PAF/PPM: Atrial paced rhythm on monitor. Continue amiodarone, metoprolol and aspirin. 4. Pulmonary: CXR vascular congestion and patchy bilateral infiltrates, BNP elevated, suspect she has combination COPD exacerbation and CHF +/- pneumonia.  Continue steroids and bronchodilaters, change cipro to levaquin to better cover respiratory pathogens and D/C IVFs and give dose lasix today and then daily prn 5. Cystic lesion left ovary on CT: consider OP non-emergent transvaginal and pelvic ultrasound. 6. HTN: Controlled. 7. No Code Blue/  PT/OT consults 8.   LOS: 3 days   [name] [name] 01/29/2013, 7:22 AM

## 2013-01-29 NOTE — Evaluation (Signed)
Physical Therapy Evaluation Patient Details Name: [name] MRN: [id number] DOB: 10/10/1926 Today's Date: 01/29/2013 Time: 4098-1191 PT Time Calculation (min): 9 min  PT Assessment / Plan / Recommendation Clinical Impression  Pt is an 79 year old female admitted for acute sigmoid diverticulitis and small diverticular abscess.  Pt lives alone however daughter lives a few houses away and can assist pt upon d/c.  Pt would benefit from acute PT services in order to improve independence with transfers and ambulation by increasing activity tolerance and monitoring vitals with mobility as pt's SaO2 dropped with ambulation requiring O2 [place].    PT Assessment  Patient needs continued PT services    Follow Up Recommendations  Home health PT;Supervision - Intermittent    Does the patient have the potential to tolerate intense rehabilitation      Barriers to Discharge        Equipment Recommendations  Cane (possibly cane depending on progress)    Recommendations for Other Services     Frequency Min 3X/week    Precautions / Restrictions Precautions Precautions: Fall Precaution Comments: monitor sats   Pertinent Vitals/Pain SATURATION QUALIFICATIONS: (This note is used to comply with regulatory documentation for home oxygen)  Patient Saturations on Room Air at Rest = 92%  Patient Saturations on Room Air while Ambulating = 86%  Patient Saturations on 3 Liters of oxygen while Ambulating = 90%  Please briefly explain why patient needs home oxygen: to maintain sats above 88% with ambulation      Mobility  Bed Mobility Bed Mobility: Supine to Sit Supine to Sit: 5: Supervision;HOB flat;With rails Transfers Transfers: Sit to Stand;Stand to Sit Sit to Stand: 4: Min guard;From bed;With upper extremity assist Stand to Sit: 4: Min guard;To chair/3-in-1;With upper extremity assist Ambulation/Gait Ambulation/Gait Assistance: 4: Min guard Ambulation Distance (Feet): 80  Feet Assistive device: Other (Comment) Ambulation/Gait Assistance Details: pt pushed dynamap as she felt she needed a little more support first time ambulating, SaO2 dropped to 85% so applied 3L O2 to increase to 90% Gait Pattern: Step-through pattern;Decreased stride length General Gait Details: O2 applied, wheezing and SOB observed, educated on pursed lip breathing    Exercises     PT Diagnosis: Difficulty walking  PT Problem List: Decreased activity tolerance;Decreased mobility;Cardiopulmonary status limiting activity;Decreased safety awareness PT Treatment Interventions: DME instruction;Gait training;Functional mobility training;Therapeutic activities;Therapeutic exercise;Patient/family education   PT Goals Acute Rehab PT Goals PT Goal Formulation: With patient Time For Goal Achievement: 02/05/13 Potential to Achieve Goals: Good Pt will go Sit to Stand: with modified independence PT Goal: Sit to Stand - Progress: Goal set today Pt will go Stand to Sit: with modified independence PT Goal: Stand to Sit - Progress: Goal set today Pt will Ambulate: 51 - 150 feet;with modified independence;with least restrictive assistive device PT Goal: Ambulate - Progress: Goal set today Pt will Perform Home Exercise Program: with supervision, verbal cues required/provided PT Goal: Perform Home Exercise Program - Progress: Goal set today  Visit Information  Last PT Received On: 01/29/13 Assistance Needed: +1    Subjective Data  Subjective: Now I can sit up and watch Dr. Michele Mcalpine.   Prior Functioning  Home Living Lives With: Alone Available Help at Discharge: Family;Available PRN/intermittently Type of Home: House Home Adaptive Equipment: None Additional Comments: Daughter lives in neighborhood Prior Function Level of Independence: Independent Driving: Yes Communication Communication: No difficulties    Cognition  Cognition Arousal/Alertness: Awake/alert Behavior During Therapy: WFL for  tasks assessed/performed Overall Cognitive Status: Within Functional  Limits for tasks assessed    Extremity/Trunk Assessment Right Upper Extremity Assessment RUE ROM/Strength/Tone: [facility] for tasks assessed Left Upper Extremity Assessment LUE ROM/Strength/Tone: [facility] for tasks assessed Right Lower Extremity Assessment RLE ROM/Strength/Tone: [facility] for tasks assessed Left Lower Extremity Assessment LLE ROM/Strength/Tone: WFL for tasks assessed   Balance    End of Session PT - End of Session Equipment Utilized During Treatment: Oxygen Activity Tolerance: Patient limited by fatigue Patient left: in chair;with call bell/phone within reach;with family/visitor present Nurse Communication: Mobility status (RN and tech observed pt ambulating)  GP     Tymar Polyak,KATHrine E 01/29/2013, 3:42 PM Zenovia Jarred, PT, DPT 01/29/2013 Pager: [phone]

## 2013-01-29 NOTE — Progress Notes (Signed)
Dr. Nehemiah Settle returned call, orders received, will continue to monitor.

## 2013-01-30 ENCOUNTER — Inpatient Hospital Stay (HOSPITAL_COMMUNITY): Payer: Medicare Other

## 2013-01-30 DIAGNOSIS — J441 Chronic obstructive pulmonary disease with (acute) exacerbation: Secondary | ICD-10-CM | POA: Diagnosis not present

## 2013-01-30 DIAGNOSIS — K639 Disease of intestine, unspecified: Secondary | ICD-10-CM | POA: Diagnosis not present

## 2013-01-30 DIAGNOSIS — I10 Essential (primary) hypertension: Secondary | ICD-10-CM | POA: Diagnosis not present

## 2013-01-30 DIAGNOSIS — R110 Nausea: Secondary | ICD-10-CM | POA: Diagnosis not present

## 2013-01-30 DIAGNOSIS — I4891 Unspecified atrial fibrillation: Secondary | ICD-10-CM | POA: Diagnosis not present

## 2013-01-30 LAB — CBC
HCT: 34.7 % — ABNORMAL LOW (ref 36.0–46.0)
MCH: 28.4 pg (ref 26.0–34.0)
MCV: 88.7 fL (ref 78.0–100.0)
Platelets: 348 10*3/uL (ref 150–400)
RBC: 3.91 MIL/uL (ref 3.87–5.11)
WBC: 12.2 10*3/uL — ABNORMAL HIGH (ref 4.0–10.5)

## 2013-01-30 LAB — BASIC METABOLIC PANEL
BUN: 16 mg/dL (ref 6–23)
CO2: 30 mEq/L (ref 19–32)
Calcium: 8.8 mg/dL (ref 8.4–10.5)
Chloride: 98 mEq/L (ref 96–112)
Creatinine, Ser: 0.65 mg/dL (ref 0.50–1.10)

## 2013-01-30 NOTE — Progress Notes (Signed)
Subjective: Patient still with some wheezing, cough occasionally productive of colored mucus. She does have some abdominal discomfort. There's been no emesis. Her white count is down. So far this morning she states she has not eaten anything.  Objective: Vital signs in last 24 hours: Temp:  [98 F (36.7 C)-98.7 F (37.1 C)] 98.1 F (36.7 C) (05/17 0537) Pulse Rate:  [64-100] 98 (05/17 0537) Resp:  [19-22] 19 (05/17 0537) BP: (121-131)/(55-74) 127/72 mmHg (05/17 0537) SpO2:  [94 %-99 %] 94 % (05/17 0849) Weight:  [54.1 kg (119 lb 4.3 oz)] 54.1 kg (119 lb 4.3 oz) (05/17 0537) Weight change:  Last BM Date: 01/29/13  Intake/Output from previous day: 05/16 0701 - 05/17 0700 In: 750 [P.O.:600; IV Piggyback:150] Out: 2800 [Urine:2800] Intake/Output this shift: Total I/O In: 200 [IV Piggyback:200] Out: -   General appearance: alert and cooperative Resp: Bilateral expiratory wheeze Cardio: regular rate and rhythm, S1, S2 normal, no murmur, click, rub or gallop GI: Slight protuberant, decreased bowel sounds, tympanic   Lab Results:  Results for orders placed during the hospital encounter of 01/26/13 (from the past 24 hour(s))  POTASSIUM     Status: Abnormal   Collection Time    01/29/13  6:19 PM      Result Value Range   Potassium 3.4 (*) 3.5 - 5.1 mEq/L  CBC     Status: Abnormal   Collection Time    01/30/13  5:36 AM      Result Value Range   WBC 12.2 (*) 4.0 - 10.5 K/uL   RBC 3.91  3.87 - 5.11 MIL/uL   Hemoglobin 11.1 (*) 12.0 - 15.0 g/dL   HCT 16.1 (*) 09.6 - 04.5 %   MCV 88.7  78.0 - 100.0 fL   MCH 28.4  26.0 - 34.0 pg   MCHC 32.0  30.0 - 36.0 g/dL   RDW 40.9  81.1 - 91.4 %   Platelets 348  150 - 400 K/uL  BASIC METABOLIC PANEL     Status: Abnormal   Collection Time    01/30/13  5:36 AM      Result Value Range   Sodium 133 (*) 135 - 145 mEq/L   Potassium 3.9  3.5 - 5.1 mEq/L   Chloride 98  96 - 112 mEq/L   CO2 30  19 - 32 mEq/L   Glucose, Bld 109 (*) 70 - 99  mg/dL   BUN 16  6 - 23 mg/dL   Creatinine, Ser 7.82  0.50 - 1.10 mg/dL   Calcium 8.8  8.4 - 95.6 mg/dL   GFR calc non Af Amer 80 (*) >90 mL/min   GFR calc Af Amer >90  >90 mL/min      Studies/Results: No results found.  Medications:  Prior to Admission:  Prescriptions prior to admission  Medication Sig Dispense Refill  . amiodarone (PACERONE) 200 MG tablet Take 100 mg by mouth daily.       Marland Kitchen amLODipine (NORVASC) 5 MG tablet Take 5 mg by mouth daily.       Marland Kitchen aspirin EC 81 MG tablet Take 81 mg by mouth at bedtime.       . budesonide-formoterol (SYMBICORT) 160-4.5 MCG/ACT inhaler Inhale 2 puffs into the lungs 2 (two) times daily as needed (for shortness of breath).      . Calcium Carbonate-Vitamin D (CALCIUM 500/D) 500-125 MG-UNIT TABS Take 1 tablet by mouth daily.       . cycloSPORINE (RESTASIS) 0.05 % ophthalmic emulsion Place 1  drop into both eyes 2 (two) times daily.       . DULoxetine (CYMBALTA) 60 MG capsule Take 60 mg by mouth daily.      Marland Kitchen HYDROcodone-acetaminophen (NORCO/VICODIN) 5-325 MG per tablet Take 1 tablet by mouth every 8 (eight) hours as needed for pain.      Marland Kitchen latanoprost (XALATAN) 0.005 % ophthalmic solution Place 1 drop into both eyes at bedtime.       . metoprolol (LOPRESSOR) 50 MG tablet Take 50 mg by mouth 2 (two) times daily.        . Multiple Vitamin (MULTIVITAMIN WITH MINERALS) TABS Take 1 tablet by mouth daily.      Marland Kitchen neomycin-polymyxin b-dexamethasone (MAXITROL) 3.5-10000-0.1 SUSP Apply 1 drop topically as directed. Uses for 5 days every month, applied to eyelids.      . Olopatadine HCl (PATADAY) 0.2 % SOLN Place 1 drop into both eyes daily.      Marland Kitchen omega-3 acid ethyl esters (LOVAZA) 1 G capsule Take 1 g by mouth daily.      Marland Kitchen zolpidem (AMBIEN) 10 MG tablet Take 10 mg by mouth at bedtime as needed for sleep.        Scheduled: . ipratropium  0.5 mg Nebulization Q6H   And  . albuterol  2.5 mg Nebulization Q6H  . amiodarone  100 mg Oral Daily  . amLODipine   5 mg Oral Daily  . aspirin EC  81 mg Oral QHS  . cycloSPORINE  1 drop Both Eyes BID  . docusate sodium  100 mg Oral BID  . DULoxetine  60 mg Oral Daily  . enoxaparin (LOVENOX) injection  40 mg Subcutaneous Q24H  . latanoprost  1 drop Both Eyes QHS  . levofloxacin (LEVAQUIN) IV  250 mg Intravenous Q24H  . metoprolol  50 mg Oral BID  . metronidazole  500 mg Intravenous Q8H  . olopatadine  1 drop Both Eyes Daily  . predniSONE  40 mg Oral Q breakfast  . sodium chloride  3 mL Intravenous Q12H   Continuous:   Assessment/Plan: Diverticulitis in the sigmoid region with associated fluid collection consistent with abscess. Continue IV antibiotics. Patient abdomen slightly protuberant today, she does complain of nausea, check abdominal series, rule out ileus COPD exacerbation, cannot exclude pulmonary infiltrate and component of vascular congestion. Continue antibiotics frequent nebulizer treatments, steroids Paroxysmal atrial fibrillation, continue current meds Hypokalemia improved. Hypertension   LOS: 4 days   Farzana Koci D 01/30/2013, 9:46 AM

## 2013-01-30 NOTE — Progress Notes (Signed)
MD notified of K - 3.4. New orders received for 40 meq KDUR x1 and BMET in am.  Earnest Conroy. Clelia Croft, RN

## 2013-01-31 DIAGNOSIS — I10 Essential (primary) hypertension: Secondary | ICD-10-CM | POA: Diagnosis not present

## 2013-01-31 DIAGNOSIS — I4891 Unspecified atrial fibrillation: Secondary | ICD-10-CM | POA: Diagnosis not present

## 2013-01-31 DIAGNOSIS — R110 Nausea: Secondary | ICD-10-CM | POA: Diagnosis not present

## 2013-01-31 DIAGNOSIS — J441 Chronic obstructive pulmonary disease with (acute) exacerbation: Secondary | ICD-10-CM | POA: Diagnosis not present

## 2013-01-31 LAB — BASIC METABOLIC PANEL
BUN: 16 mg/dL (ref 6–23)
CO2: 29 mEq/L (ref 19–32)
Calcium: 9.3 mg/dL (ref 8.4–10.5)
Chloride: 97 mEq/L (ref 96–112)
Creatinine, Ser: 0.67 mg/dL (ref 0.50–1.10)
Glucose, Bld: 142 mg/dL — ABNORMAL HIGH (ref 70–99)

## 2013-01-31 MED ORDER — MAGIC MOUTHWASH
5.0000 mL | ORAL | Status: DC | PRN
Start: 2013-01-31 — End: 2013-02-04
  Administered 2013-01-31 – 2013-02-01 (×4): 5 mL via ORAL
  Filled 2013-01-31 (×4): qty 5

## 2013-01-31 NOTE — Progress Notes (Signed)
Pt ambulated down the hall twice in succession with minimal assist.  Oxygen sat 93% when finished walking without oxygen.

## 2013-01-31 NOTE — Progress Notes (Signed)
Subjective: Patient has not had a bowel movement. Still complains of some abdominal pain. X-ray shows adynamic ileus. Patient still tolerated some clears. She does complain of some mouth pain. She states Magic mouthwash has helped in the past.  Objective: Vital signs in last 24 hours: Temp:  [97 F (36.1 C)-98.6 F (37 C)] 97 F (36.1 C) (05/18 0531) Pulse Rate:  [96-114] 99 (05/18 0531) Resp:  [18-20] 18 (05/18 0531) BP: (99-144)/(51-82) 138/82 mmHg (05/18 0531) SpO2:  [96 %-98 %] 96 % (05/18 0817) Weight change:  Last BM Date: 01/29/13  Intake/Output from previous day: 05/17 0701 - 05/18 0700 In: 1070 [P.O.:600; IV Piggyback:470] Out: 2250 [Urine:2250] Intake/Output this shift: Total I/O In: -  Out: 300 [Urine:300]  General appearance: alert and cooperative Resp: Moderate air movement bilateral few expiratory wheeze, no rhonchi Cardio: Regular rhythm GI: Slight protuberant, positive bowel sounds and  Lab Results:  No results found for this or any previous visit (from the past 24 hour(s)).    Studies/Results: Dg Abd Portable 1v  01/30/2013   *RADIOLOGY REPORT*  Clinical Data: Abdominal pain.  Rule out ileus.  PORTABLE ABDOMEN - 1 VIEW  Comparison: CT of 01/26/2013  Findings: Single portable supine view.  Contrast is seen within primarily the colon.  There are small bowel loops which measure up to 3.3 cm.  Minimally dilated.  Descending colonic and likely rectosigmoid stool.  Vascular calcifications.  Convex right lumbar spine curvature. Lucency adjacent the right lobe of the liver is favored to be related to perihepatic fat.  Low sensitivity for free intraperitoneal air, given supine technique.  IMPRESSION: Mild small bowel dilatation, suggesting minimal adynamic ileus.  Low sensitivity for free intraperitoneal air.  Lucency along the liver edge is favored to be due to a perihepatic fat density.  If there is a concern of free intraperitoneal air, recommend upright films.    Original Report Authenticated By: Jeronimo Greaves, M.D.    Medications:  Prior to Admission:  Prescriptions prior to admission  Medication Sig Dispense Refill  . amiodarone (PACERONE) 200 MG tablet Take 100 mg by mouth daily.       Marland Kitchen amLODipine (NORVASC) 5 MG tablet Take 5 mg by mouth daily.       Marland Kitchen aspirin EC 81 MG tablet Take 81 mg by mouth at bedtime.       . budesonide-formoterol (SYMBICORT) 160-4.5 MCG/ACT inhaler Inhale 2 puffs into the lungs 2 (two) times daily as needed (for shortness of breath).      . Calcium Carbonate-Vitamin D (CALCIUM 500/D) 500-125 MG-UNIT TABS Take 1 tablet by mouth daily.       . cycloSPORINE (RESTASIS) 0.05 % ophthalmic emulsion Place 1 drop into both eyes 2 (two) times daily.       . DULoxetine (CYMBALTA) 60 MG capsule Take 60 mg by mouth daily.      Marland Kitchen HYDROcodone-acetaminophen (NORCO/VICODIN) 5-325 MG per tablet Take 1 tablet by mouth every 8 (eight) hours as needed for pain.      Marland Kitchen latanoprost (XALATAN) 0.005 % ophthalmic solution Place 1 drop into both eyes at bedtime.       . metoprolol (LOPRESSOR) 50 MG tablet Take 50 mg by mouth 2 (two) times daily.        . Multiple Vitamin (MULTIVITAMIN WITH MINERALS) TABS Take 1 tablet by mouth daily.      Marland Kitchen neomycin-polymyxin b-dexamethasone (MAXITROL) 3.5-10000-0.1 SUSP Apply 1 drop topically as directed. Uses for 5 days every month, applied to eyelids.      Marland Kitchen  Olopatadine HCl (PATADAY) 0.2 % SOLN Place 1 drop into both eyes daily.      Marland Kitchen omega-3 acid ethyl esters (LOVAZA) 1 G capsule Take 1 g by mouth daily.      Marland Kitchen zolpidem (AMBIEN) 10 MG tablet Take 10 mg by mouth at bedtime as needed for sleep.        Scheduled: . ipratropium  0.5 mg Nebulization Q6H   And  . albuterol  2.5 mg Nebulization Q6H  . amiodarone  100 mg Oral Daily  . amLODipine  5 mg Oral Daily  . aspirin EC  81 mg Oral QHS  . cycloSPORINE  1 drop Both Eyes BID  . docusate sodium  100 mg Oral BID  . DULoxetine  60 mg Oral Daily  . enoxaparin  (LOVENOX) injection  40 mg Subcutaneous Q24H  . latanoprost  1 drop Both Eyes QHS  . levofloxacin (LEVAQUIN) IV  250 mg Intravenous Q24H  . metoprolol  50 mg Oral BID  . metronidazole  500 mg Intravenous Q8H  . olopatadine  1 drop Both Eyes Daily  . sodium chloride  3 mL Intravenous Q12H   Continuous:   Assessment/Plan: Sigmoid diverticulitis, x-ray showed adynamic ileus, check followup potassium continue antibiotics, analgesia, encourage ambulation COPD exacerbation improved based on exam. Paroxysmal A. fib Hypertension  LOS: 5 days   Witt Plitt D 01/31/2013, 9:54 AM

## 2013-02-01 ENCOUNTER — Ambulatory Visit (HOSPITAL_COMMUNITY): Payer: Medicare Other

## 2013-02-01 ENCOUNTER — Encounter (HOSPITAL_COMMUNITY): Payer: Self-pay | Admitting: Radiology

## 2013-02-01 DIAGNOSIS — K5732 Diverticulitis of large intestine without perforation or abscess without bleeding: Secondary | ICD-10-CM | POA: Diagnosis not present

## 2013-02-01 DIAGNOSIS — I700 Atherosclerosis of aorta: Secondary | ICD-10-CM | POA: Diagnosis not present

## 2013-02-01 MED ORDER — LEVOFLOXACIN 250 MG PO TABS
250.0000 mg | ORAL_TABLET | Freq: Every day | ORAL | Status: DC
  Administered 2013-02-01: 250 mg via ORAL
  Filled 2013-02-01 (×2): qty 1

## 2013-02-01 MED ORDER — IOHEXOL 300 MG/ML  SOLN
25.0000 mL | INTRAMUSCULAR | Status: AC
  Administered 2013-02-01 (×2): 25 mL via ORAL

## 2013-02-01 MED ORDER — POLYETHYLENE GLYCOL 3350 17 G PO PACK
17.0000 g | PACK | Freq: Every day | ORAL | Status: DC
  Administered 2013-02-01: 17 g via ORAL
  Filled 2013-02-01 (×2): qty 1

## 2013-02-01 MED ORDER — IOHEXOL 300 MG/ML  SOLN
100.0000 mL | Freq: Once | INTRAMUSCULAR | Status: AC | PRN
  Administered 2013-02-01: 100 mL via INTRAVENOUS

## 2013-02-01 MED ORDER — BISACODYL 10 MG RE SUPP
10.0000 mg | Freq: Once | RECTAL | Status: AC
  Administered 2013-02-01: 10 mg via RECTAL
  Filled 2013-02-01: qty 1

## 2013-02-01 MED ORDER — DILTIAZEM HCL ER COATED BEADS 120 MG PO CP24
120.0000 mg | ORAL_CAPSULE | Freq: Every day | ORAL | Status: DC
  Administered 2013-02-01 – 2013-02-04 (×4): 120 mg via ORAL
  Filled 2013-02-01 (×4): qty 1

## 2013-02-01 MED ORDER — METRONIDAZOLE 500 MG PO TABS
500.0000 mg | ORAL_TABLET | Freq: Three times a day (TID) | ORAL | Status: DC
  Administered 2013-02-01 – 2013-02-02 (×4): 500 mg via ORAL
  Filled 2013-02-01 (×8): qty 1

## 2013-02-01 NOTE — Progress Notes (Signed)
Subjective: No BM, passing small amount of gas, pain better, less shortness of breath  Objective: Vital signs in last 24 hours: Temp:  [97.5 F (36.4 C)-98 F (36.7 C)] 98 F (36.7 C) (05/19 0509) Pulse Rate:  [97-104] 100 (05/19 0509) Resp:  [18] 18 (05/19 0509) BP: (115-147)/(70-91) 144/91 mmHg (05/19 0509) SpO2:  [93 %-100 %] 93 % (05/19 0509) Weight:  [54.7 kg (120 lb 9.5 oz)] 54.7 kg (120 lb 9.5 oz) (05/19 0509) Weight change:  Last BM Date: 01/29/13  Intake/Output from previous day: 05/18 0701 - 05/19 0700 In: 1330 [P.O.:1060; IV Piggyback:270] Out: 1150 [Urine:1150] Intake/Output this shift: Total I/O In: 240 [P.O.:120; IV Piggyback:120] Out: 300 [Urine:300]  General appearance: alert and cooperative Resp: few wheezes Cardio: irregularly irregular rhythm GI: soft, nondistended, normal BS mild tenderness Extremities: extremities normal, atraumatic, no cyanosis or edema  Lab Results:  Recent Labs  01/30/13 0536  WBC 12.2*  HGB 11.1*  HCT 34.7*  PLT 348   BMET  Recent Labs  01/30/13 0536 01/31/13 1140  NA 133* 137  K 3.9 3.9  CL 98 97  CO2 30 29  GLUCOSE 109* 142*  BUN 16 16  CREATININE 0.65 0.67  CALCIUM 8.8 9.3    Studies/Results: Dg Abd Portable 1v  01/30/2013   *RADIOLOGY REPORT*  Clinical Data: Abdominal pain.  Rule out ileus.  PORTABLE ABDOMEN - 1 VIEW  Comparison: CT of 01/26/2013  Findings: Single portable supine view.  Contrast is seen within primarily the colon.  There are small bowel loops which measure up to 3.3 cm.  Minimally dilated.  Descending colonic and likely rectosigmoid stool.  Vascular calcifications.  Convex right lumbar spine curvature. Lucency adjacent the right lobe of the liver is favored to be related to perihepatic fat.  Low sensitivity for free intraperitoneal air, given supine technique.  IMPRESSION: Mild small bowel dilatation, suggesting minimal adynamic ileus.  Low sensitivity for free intraperitoneal air.  Lucency  along the liver edge is favored to be due to a perihepatic fat density.  If there is a concern of free intraperitoneal air, recommend upright films.   Original Report Authenticated By: Jeronimo Greaves, M.D.    Medications: I have reviewed the patient's current medications.  Assessment/Plan: Acute sigmoid diverticulitis and small diverticular abscess (3.4 cm diameter): Patient clinically improved with less pain and decreased WBC. Advance diet. Change antibiotics to po, day 7.  Recheck CT abd/pelvis to recheck abscess.  Atrial Fibrillation Continue amiodarone, metoprolol and aspirin. Rate 100-110 D/C amlodipine and start low dose of diltiazem Pulmonary: much improved after prednisone and lasix one dose.  D/C prednisone, continue bronchodilaters  Cystic lesion left ovary on CT: consider OP non-emergent transvaginal and pelvic ultrasound.  HTN: Controlled.  No Code Blue   LOS: 6 days   [name] JOSEPH 02/01/2013, 6:22 AM

## 2013-02-01 NOTE — Progress Notes (Signed)
Physical Therapy Treatment and D/C from acute PT Patient Details Name: [name] MRN: [id number] DOB: 23-Jan-1927 Today's Date: 02/01/2013 Time: 4098-1191 PT Time Calculation (min): 23 min  PT Assessment / Plan / Recommendation Comments on Treatment Session  Pt reports ambulating with daughter in hallway however agreeble to ambulate again with therapist as well as perform exercises.  Pt to be given HEP on exercises performed today.  Pt reports she will not need further PT services at this time, so will d/c from acute PT.  Goals almost met as pt is now supervision level however daughter can assist at home as needed.    Follow Up Recommendations  Supervision - Intermittent;Home health PT     Does the patient have the potential to tolerate intense rehabilitation     Barriers to Discharge        Equipment Recommendations  None recommended by PT    Recommendations for Other Services    Frequency     Plan All goals met and education completed, patient dischaged from PT services    Precautions / Restrictions Precautions Precautions: Fall   Pertinent Vitals/Pain Reports abdominal "tightness"    Mobility  Bed Mobility Bed Mobility: Supine to Sit;Sit to Supine Supine to Sit: 5: Supervision Sit to Supine: 5: Supervision Transfers Transfers: Sit to Stand;Stand to Sit Sit to Stand: From bed;With upper extremity assist;5: Supervision Stand to Sit: With upper extremity assist;5: Supervision;To bed Ambulation/Gait Ambulation/Gait Assistance: 5: Supervision Ambulation Distance (Feet): 380 Feet Assistive device: None Ambulation/Gait Assistance Details: no LOB observed, SaO2 93% room air during ambulation Gait Pattern: Step-through pattern;Decreased stride length    Exercises General Exercises - Upper Extremity Shoulder Flexion: AROM;Both;15 reps;Supine Shoulder ABduction: AROM;Both;15 reps;Supine General Exercises - Lower Extremity Long Arc Quad: AROM;Seated;Both;15 reps Heel  Slides: 15 reps;Supine;Both;AROM Hip ABduction/ADduction: AROM;Both;15 reps;Supine Straight Leg Raises: AROM;Both;15 reps;Supine Hip Flexion/Marching: AROM;Both;Seated;15 reps   PT Diagnosis:    PT Problem List:   PT Treatment Interventions:     PT Goals Acute Rehab PT Goals PT Goal: Sit to Stand - Progress: Partly met PT Goal: Stand to Sit - Progress: Partly met PT Goal: Ambulate - Progress: Partly met PT Goal: Perform Home Exercise Program - Progress: Met  Visit Information  Last PT Received On: 02/01/13 Assistance Needed: +1    Subjective Data  Subjective: I'm waiting to go for this test.  I had to drink 2 cups of that contrast stuff.   Cognition  Cognition Arousal/Alertness: Awake/alert Behavior During Therapy: WFL for tasks assessed/performed Overall Cognitive Status: Within Functional Limits for tasks assessed    Balance     End of Session PT - End of Session Activity Tolerance: Patient tolerated treatment well Patient left: in bed;with call bell/phone within reach   GP     Chevonne Bostrom,KATHrine E 02/01/2013, 12:45 PM Zenovia Jarred, PT, DPT 02/01/2013 Pager: [phone]

## 2013-02-01 NOTE — Evaluation (Signed)
Occupational Therapy Evaluation Patient Details Name: [name] MRN: [id number] DOB: 1927-07-12 Today's Date: 02/01/2013 Time: 1010-1031 OT Time Calculation (min): 21 min  OT Assessment / Plan / Recommendation Clinical Impression  Pt is an 79 year old female admitted for acute sigmoid diverticulitis and small diverticular abscess.  Pt lives alone however daughter lives a few houses away and can assist pt upon d/c. Pt will benefit from skilled OT to increase I with ADL activity  and  return to PLOF    OT Assessment  Patient needs continued OT Services    Follow Up Recommendations  Home health OT       Equipment Recommendations  None recommended by OT       Frequency  Min 2X/week    Precautions / Restrictions Precautions Precautions: Fall Restrictions Weight Bearing Restrictions: No       ADL  Grooming: Performed;Wash/dry hands;Wash/dry face;Teeth care;Supervision/safety Where Assessed - Grooming: Unsupported standing Lower Body Dressing: Performed;Min guard Where Assessed - Lower Body Dressing: Unsupported sit to stand Toilet Transfer: Performed;Min guard Toilet Transfer Method: Sit to Barista: Comfort height toilet Toileting - Clothing Manipulation and Hygiene: Simulated;Min guard Where Assessed - Glass blower/designer Manipulation and Hygiene: Standing    OT Diagnosis: Generalized weakness  OT Problem List: Decreased activity tolerance;Decreased strength OT Treatment Interventions: Self-care/ADL training;Patient/family education;Energy conservation   OT Goals Acute Rehab OT Goals OT Goal Formulation: With patient Time For Goal Achievement: 02/15/13 Potential to Achieve Goals: Good ADL Goals Pt Will Perform Grooming: with modified independence;Standing at sink ADL Goal: Grooming - Progress: Goal set today Pt Will Perform Upper Body Dressing: with modified independence;Sit to stand from chair ADL Goal: Upper Body Dressing - Progress:  Goal set today Pt Will Perform Lower Body Dressing: with modified independence;Sit to stand from bed ADL Goal: Lower Body Dressing - Progress: Goal set today Pt Will Transfer to Toilet: with modified independence;Comfort height toilet Pt Will Perform Toileting - Clothing Manipulation: with modified independence;Standing ADL Goal: Toileting - Clothing Manipulation - Progress: Goal set today Pt Will Perform Tub/Shower Transfer: Shower transfer;with modified independence ADL Goal: Tub/Shower Transfer - Progress: Goal set today  Visit Information  Last OT Received On: 02/01/13    Subjective Data  Subjective: I did just have a little luck in the bathroom   Prior Functioning     Home Living Lives With: Alone Available Help at Discharge: Family;Available PRN/intermittently Type of Home: House Home Layout: One level Bathroom Shower/Tub: Health visitor: Standard Home Adaptive Equipment: None Additional Comments: Daughter lives in neighborhood Prior Function Level of Independence: Independent Driving: Yes Communication Communication: No difficulties         Vision/Perception Vision - History Patient Visual Report: No change from baseline   Cognition  Cognition Arousal/Alertness: Awake/alert Behavior During Therapy: WFL for tasks assessed/performed Overall Cognitive Status: Within Functional Limits for tasks assessed    Extremity/Trunk Assessment Right Upper Extremity Assessment RUE ROM/Strength/Tone: Within functional levels Left Upper Extremity Assessment LUE ROM/Strength/Tone: Within functional levels     Mobility Bed Mobility Bed Mobility: Supine to Sit Supine to Sit: 5: Supervision;HOB flat;With rails Transfers Sit to Stand: 4: Min guard;From bed;With upper extremity assist Stand to Sit: 4: Min guard;To chair/3-in-1;With upper extremity assist           End of Session OT - End of Session Activity Tolerance: Patient tolerated treatment  well Patient left: in bed;with family/visitor present  GO     Iwalani Templeton, [place] Kung 02/01/2013, 10:39 AM

## 2013-02-02 ENCOUNTER — Inpatient Hospital Stay (HOSPITAL_COMMUNITY): Payer: Medicare Other

## 2013-02-02 DIAGNOSIS — K5732 Diverticulitis of large intestine without perforation or abscess without bleeding: Secondary | ICD-10-CM

## 2013-02-02 DIAGNOSIS — K630 Abscess of intestine: Secondary | ICD-10-CM | POA: Diagnosis not present

## 2013-02-02 DIAGNOSIS — K5900 Constipation, unspecified: Secondary | ICD-10-CM | POA: Diagnosis not present

## 2013-02-02 DIAGNOSIS — K56609 Unspecified intestinal obstruction, unspecified as to partial versus complete obstruction: Secondary | ICD-10-CM | POA: Diagnosis not present

## 2013-02-02 LAB — CBC WITH DIFFERENTIAL/PLATELET
Eosinophils Absolute: 0.3 10*3/uL (ref 0.0–0.7)
Eosinophils Relative: 2 % (ref 0–5)
HCT: 37.5 % (ref 36.0–46.0)
Lymphocytes Relative: 10 % — ABNORMAL LOW (ref 12–46)
Lymphs Abs: 1.4 10*3/uL (ref 0.7–4.0)
MCH: 28.6 pg (ref 26.0–34.0)
MCV: 88 fL (ref 78.0–100.0)
Monocytes Absolute: 1.5 10*3/uL — ABNORMAL HIGH (ref 0.1–1.0)
RBC: 4.26 MIL/uL (ref 3.87–5.11)
WBC: 14.3 10*3/uL — ABNORMAL HIGH (ref 4.0–10.5)

## 2013-02-02 LAB — BASIC METABOLIC PANEL
CO2: 30 mEq/L (ref 19–32)
Chloride: 93 mEq/L — ABNORMAL LOW (ref 96–112)
Creatinine, Ser: 0.81 mg/dL (ref 0.50–1.10)
Glucose, Bld: 103 mg/dL — ABNORMAL HIGH (ref 70–99)
Sodium: 133 mEq/L — ABNORMAL LOW (ref 135–145)

## 2013-02-02 LAB — CREATININE, SERUM: GFR calc Af Amer: 87 mL/min — ABNORMAL LOW (ref >90)

## 2013-02-02 MED ORDER — SENNOSIDES-DOCUSATE SODIUM 8.6-50 MG PO TABS
2.0000 | ORAL_TABLET | Freq: Every day | ORAL | Status: DC
  Administered 2013-02-02: 2 via ORAL
  Filled 2013-02-02 (×3): qty 2

## 2013-02-02 MED ORDER — HYDROCODONE-ACETAMINOPHEN 5-325 MG PO TABS
1.0000 | ORAL_TABLET | ORAL | Status: DC | PRN
  Administered 2013-02-03 – 2013-02-04 (×2): 1 via ORAL
  Filled 2013-02-02 (×2): qty 1

## 2013-02-02 MED ORDER — POLYETHYLENE GLYCOL 3350 17 G PO PACK
17.0000 g | PACK | Freq: Two times a day (BID) | ORAL | Status: DC
  Administered 2013-02-02: 17 g via ORAL
  Filled 2013-02-02 (×4): qty 1

## 2013-02-02 MED ORDER — MIDAZOLAM HCL 2 MG/2ML IJ SOLN
INTRAMUSCULAR | Status: AC | PRN
  Administered 2013-02-02 (×2): 1 mg via INTRAVENOUS

## 2013-02-02 MED ORDER — CIPROFLOXACIN HCL 500 MG PO TABS
500.0000 mg | ORAL_TABLET | Freq: Two times a day (BID) | ORAL | Status: DC
  Administered 2013-02-02: 500 mg via ORAL
  Filled 2013-02-02 (×3): qty 1

## 2013-02-02 MED ORDER — FENTANYL CITRATE 0.05 MG/ML IJ SOLN
INTRAMUSCULAR | Status: AC | PRN
  Administered 2013-02-02: 50 ug via INTRAVENOUS

## 2013-02-02 MED ORDER — ERTAPENEM SODIUM 1 G IJ SOLR
1.0000 g | INTRAMUSCULAR | Status: DC
  Administered 2013-02-02 – 2013-02-04 (×3): 1 g via INTRAVENOUS
  Filled 2013-02-02 (×3): qty 1

## 2013-02-02 MED ORDER — ALBUTEROL SULFATE (5 MG/ML) 0.5% IN NEBU: 2.5000 mg | INHALATION_SOLUTION | RESPIRATORY_TRACT | Status: DC | PRN

## 2013-02-02 NOTE — Consult Note (Signed)
Reason for Consult: acute sigmoid diverticulitis Referring Physician: Dr. Roseanne Kaufman is an 79 y.o. female with a past medical history of hypertension, hyperlipidemia, lung cancer, paroxysmal atrial fibrillation who was admitted to [facility] on 01/26/13 with abdominal pain.  She was found to have diverticulitis.  She was started on cipro/flagyl, clear liquid diet and treated with pain medication.  She clinically improved, however, arepeat CT of abdomen and pelvis showed a new 6.3cm fluid collection and therefore surgical consultation was obtained.  The patient denies having previous episodes or diagnosis of diverticulitis.  She claims she has never had a colonoscopy as well.  At present time, she complains of generalized abdominal pain, it is worse in the right lower quadrant.  She denies nausea, vomiting or diarrhea.  Last bowel movement was 5 days ago, she is passing flatus.  She denies fever or chills.  Denies melena or hematochezia.  She is currently on full liquid diet and tolerating well.    Daughter at bedside  Past Medical History  Diagnosis Date  . Lung cancer 2007    hx stage 1 BAC/ RML. s/p wedge resection from RML, superior segment RLL resection s/p seed implants  . Mycobacterial disease   . Paroxysmal atrial fibrillation   . Bronchiectasis     bronch 10/2009 w/ stenotrophomonas sens to levaquin and bactrim  . Tachycardia-bradycardia syndrome 2008    sp PPM by Dr Amil Amen  . Osteoporosis   . Hypertension   . Hyperlipidemia   . Multinodular goiter (nontoxic)   . Glaucoma   . Anxiety   . Sciatica   . Colonic diverticular abscess 01/26/2013    Past Surgical History  Procedure Laterality Date  . Wedge resection  2007    for lung CA  . Pacemaker insertion  5/13    MDT implanted by Dr Amil Amen  . Laminectomy  1988  . Hernia repair  1988    Family History  Problem Relation Age of Onset  . Cancer Sister   . Heart failure Father   . Diabetes Mother      Social History:  reports that she quit smoking about 7 years ago. Her smoking use included Cigarettes. She has a 27.5 pack-year smoking history. She has never used smokeless tobacco. She reports that she does not drink alcohol or use illicit drugs.  Allergies: No Known Allergies  Medications: I have reviewed the patient's current medications.  Results for orders placed during the hospital encounter of 01/26/13 (from the past 48 hour(s))  CREATININE, SERUM     Status: Abnormal   Collection Time    02/02/13  5:02 AM      Result Value Range   Creatinine, Ser 0.78  0.50 - 1.10 mg/dL   GFR calc non Af Amer 75 (*) >90 mL/min   GFR calc Af Amer 87 (*) >90 mL/min   Comment:            The eGFR has been calculated     using the CKD EPI equation.     This calculation has not been     validated in all clinical     situations.     eGFR's persistently     <90 mL/min signify     possible Chronic Kidney Disease.  BASIC METABOLIC PANEL     Status: Abnormal   Collection Time    02/02/13  5:02 AM      Result Value Range   Sodium 133 (*) 135 - 145  mEq/L   Potassium 3.5  3.5 - 5.1 mEq/L   Chloride 93 (*) 96 - 112 mEq/L   CO2 30  19 - 32 mEq/L   Glucose, Bld 103 (*) 70 - 99 mg/dL   BUN 18  6 - 23 mg/dL   Creatinine, Ser 8.29  0.50 - 1.10 mg/dL   Calcium 8.5  8.4 - 56.2 mg/dL   GFR calc non Af Amer 65 (*) >90 mL/min   GFR calc Af Amer 76 (*) >90 mL/min   Comment:            The eGFR has been calculated     using the CKD EPI equation.     This calculation has not been     validated in all clinical     situations.     eGFR's persistently     <90 mL/min signify     possible Chronic Kidney Disease.  CBC WITH DIFFERENTIAL     Status: Abnormal   Collection Time    02/02/13  5:02 AM      Result Value Range   WBC 14.3 (*) 4.0 - 10.5 K/uL   RBC 4.26  3.87 - 5.11 MIL/uL   Hemoglobin 12.2  12.0 - 15.0 g/dL   HCT 13.0  86.5 - 78.4 %   MCV 88.0  78.0 - 100.0 fL   MCH 28.6  26.0 - 34.0 pg    MCHC 32.5  30.0 - 36.0 g/dL   RDW 69.6  29.5 - 28.4 %   Platelets 424 (*) 150 - 400 K/uL   Neutrophils Relative % 77  43 - 77 %   Neutro Abs 11.1 (*) 1.7 - 7.7 K/uL   Lymphocytes Relative 10 (*) 12 - 46 %   Lymphs Abs 1.4  0.7 - 4.0 K/uL   Monocytes Relative 11  3 - 12 %   Monocytes Absolute 1.5 (*) 0.1 - 1.0 K/uL   Eosinophils Relative 2  0 - 5 %   Eosinophils Absolute 0.3  0.0 - 0.7 K/uL   Basophils Relative 0  0 - 1 %   Basophils Absolute 0.0  0.0 - 0.1 K/uL    Ct Abdomen Pelvis W Contrast  02/01/2013   *RADIOLOGY REPORT*  Clinical Data: Follow up abscess  CT ABDOMEN AND PELVIS WITH CONTRAST  Technique:  Multidetector CT imaging of the abdomen and pelvis was performed following the standard protocol during bolus administration of intravenous contrast.  Contrast: OMNIPAQUE IOHEXOL 300 MG/ML  SOLN  Comparison: CT abdomen pelvis dated 01/26/2013.  PET CT dated 01/02/2009.  Findings: Stable scarring/postsurgical changes at the right lung base.  Cardiomegaly.  Pacemaker leads, incompletely visualized.  Tiny hypoenhancing right hepatic lesions (series 2/images 24, 31, and 35), likely benign cysts.  Spleen, pancreas, and right adrenal gland are within normal limits.  Mild nodular thickening of the left adrenal gland.  Layering gallstones without associated gallbladder wall thickening. Mild pericholecystic fluid.  No intrahepatic or extrahepatic ductal dilatation.  Kidneys are notable for bilateral renal cysts measuring up to 3.5 cm in the left upper pole and 3.7 cm in the right upper pole. No hydronephrosis.  Sigmoid diverticulitis with associated 3.0 x 2.5 x 2.9 cm pericolonic fluid and gas collection in the left pelvis (series 2/image 55), grossly unchanged.  Additional 3.5 x 6.3 x 6.0 cm gas and fluid collection in the dependent pelvis with associated enhancing rim (series 2/image 58), worrisome for developing abscess, new.  No free air.  Mild mesenteric fluid/stranding  in the pelvis.  Extensive  atherosclerotic calcifications of the abdominal aorta and branch vessels.  No suspicious abdominopelvic lymphadenopathy.  Uterus is unremarkable.  4.0 x 4.7 cm fluid density lesion in the left posterior pelvis/adnexa (series 2/image 64), favored to reflect an ovarian cystic lesion, unchanged from recent CT.  This measured 2.4 x 1.8 cm on prior 2010 PET.  2.1 cm cystic lesion in the right adnexa (series 2/image 62), stable since 2010.  Bladder is unremarkable.  Degenerative changes of the visualized thoracolumbar spine.  Grade 1 anterolisthesis of L4 on L5.  IMPRESSION: Sigmoid diverticulitis with associated 3.0 cm pericolonic fluid collection/abscess, unchanged.  No free air.  Additional 6.3 cm gas and fluid collection in the dependent pelvis, worrisome for developing abscess, new.  Mild mesenteric fluid/stranding in the pelvis.  4.7 cm left ovarian cystic lesion, previously 2.4 cm in 2010. Given postmenopausal status, consider follow-up pelvic ultrasound as an outpatient.  Additional stable ancillary findings as above.   Original Report Authenticated By: Charline Bills, M.D.    Review of Systems  Constitutional: Negative for fever and chills.  Respiratory: Positive for shortness of breath and wheezing. Negative for cough and hemoptysis.   Cardiovascular: Negative for chest pain, palpitations and leg swelling.  Gastrointestinal: Positive for abdominal pain and constipation. Negative for heartburn, nausea, vomiting, diarrhea, blood in stool and melena.  Genitourinary: Negative for dysuria and hematuria.  Musculoskeletal: Negative for myalgias.  Neurological: Negative for dizziness, sensory change, speech change, seizures, weakness and headaches.  Psychiatric/Behavioral: Negative for memory loss. The patient is not nervous/anxious.    Blood pressure 121/83, pulse 96, temperature 97.4 F (36.3 C), temperature source Oral, resp. rate 18, height 5' (1.524 m), weight 122 lb 2.2 oz (55.4 kg), SpO2  95.00%. Physical Exam  Constitutional: She is oriented to person, place, and time. She appears well-developed and well-nourished. No distress.  HENT:  Head: Normocephalic and atraumatic.  Cardiovascular: Normal rate, regular rhythm and intact distal pulses.  Exam reveals no gallop and no friction rub.   No murmur heard. Respiratory: Effort normal and breath sounds normal. No respiratory distress. She has no wheezes. She has no rales. She exhibits no tenderness.  GI: She exhibits distension. She exhibits no mass. There is tenderness. There is no guarding.  Diffuse tenderness, most pronounced RLQ and pelvic region.  No hernias or masses palpable.  +BS x4 quadrants, round and distended abdomen  Musculoskeletal: She exhibits no edema.  Neurological: She is alert and oriented to person, place, and time.  Skin: Skin is warm and dry. She is not diaphoretic. No erythema. No pallor.  Psychiatric: She has a normal mood and affect. Her behavior is normal. Judgment and thought content normal.    Assessment/Plan:  Sigmoid Diverticulitis w/ 3.4cm abscess and newly formed 6.3cm fluid collection(1st episode) -Would recommend changing cipro/flagyl to invanz if okay with Infectious Disease -IR for drainage and drain placement.  The patient was placed on NPO status, but will likely be completed in the morning in which case clear liquid diet and NPO after midnight. -monitor CBC -pain control.  I presume some of her pain is due to ovarian cyst.  Constipation -repeat XR abdomen to rule out a obstruction -would not recommend additional GI stimulants due to problem #1 and concerns for perforation.   Ashok Norris ANP-BC 02/02/2013, 12:31 PM   Pager [phone]

## 2013-02-02 NOTE — Procedures (Signed)
CT guided pelvic abscess drain catheter placement 5ml purulent sent for GS, C&S No complication No blood loss. See complete dictation in [facility].

## 2013-02-02 NOTE — Progress Notes (Signed)
Subjective: Still some abdominal pain,small BM, some flatus.  Objective: Vital signs in last 24 hours: Temp:  [97.4 F (36.3 C)-99.1 F (37.3 C)] 97.4 F (36.3 C) (05/20 0517) Pulse Rate:  [85-98] 96 (05/20 0517) Resp:  [16-18] 18 (05/20 0517) BP: (113-121)/(63-83) 121/83 mmHg (05/20 0517) SpO2:  [92 %-97 %] 95 % (05/20 0517) Weight:  [55.4 kg (122 lb 2.2 oz)] 55.4 kg (122 lb 2.2 oz) (05/20 0517) Weight change: 0.7 kg (1 lb 8.7 oz) Last BM Date: 01/29/13  Intake/Output from previous day: 05/19 0701 - 05/20 0700 In: 360 [P.O.:360] Out: -  Intake/Output this shift:    General appearance: alert and cooperative Resp: clear to auscultation bilaterally Cardio: irregularly irregular rhythm GI: soft, mild tenderness, bowel sounds present Extremities: extremities normal, atraumatic, no cyanosis or edema  Lab Results: No results found for this basename: WBC, HGB, HCT, PLT,  in the last 72 hours BMET  Recent Labs  01/31/13 1140 02/02/13 0502  NA 137  --   K 3.9  --   CL 97  --   CO2 29  --   GLUCOSE 142*  --   BUN 16  --   CREATININE 0.67 0.78  CALCIUM 9.3  --     Studies/Results: Ct Abdomen Pelvis W Contrast  02/01/2013   *RADIOLOGY REPORT*  Clinical Data: Follow up abscess  CT ABDOMEN AND PELVIS WITH CONTRAST  Technique:  Multidetector CT imaging of the abdomen and pelvis was performed following the standard protocol during bolus administration of intravenous contrast.  Contrast: OMNIPAQUE IOHEXOL 300 MG/ML  SOLN  Comparison: CT abdomen pelvis dated 01/26/2013.  PET CT dated 01/02/2009.  Findings: Stable scarring/postsurgical changes at the right lung base.  Cardiomegaly.  Pacemaker leads, incompletely visualized.  Tiny hypoenhancing right hepatic lesions (series 2/images 24, 31, and 35), likely benign cysts.  Spleen, pancreas, and right adrenal gland are within normal limits.  Mild nodular thickening of the left adrenal gland.  Layering gallstones without associated  gallbladder wall thickening. Mild pericholecystic fluid.  No intrahepatic or extrahepatic ductal dilatation.  Kidneys are notable for bilateral renal cysts measuring up to 3.5 cm in the left upper pole and 3.7 cm in the right upper pole. No hydronephrosis.  Sigmoid diverticulitis with associated 3.0 x 2.5 x 2.9 cm pericolonic fluid and gas collection in the left pelvis (series 2/image 55), grossly unchanged.  Additional 3.5 x 6.3 x 6.0 cm gas and fluid collection in the dependent pelvis with associated enhancing rim (series 2/image 58), worrisome for developing abscess, new.  No free air.  Mild mesenteric fluid/stranding in the pelvis.  Extensive atherosclerotic calcifications of the abdominal aorta and branch vessels.  No suspicious abdominopelvic lymphadenopathy.  Uterus is unremarkable.  4.0 x 4.7 cm fluid density lesion in the left posterior pelvis/adnexa (series 2/image 64), favored to reflect an ovarian cystic lesion, unchanged from recent CT.  This measured 2.4 x 1.8 cm on prior 2010 PET.  2.1 cm cystic lesion in the right adnexa (series 2/image 62), stable since 2010.  Bladder is unremarkable.  Degenerative changes of the visualized thoracolumbar spine.  Grade 1 anterolisthesis of L4 on L5.  IMPRESSION: Sigmoid diverticulitis with associated 3.0 cm pericolonic fluid collection/abscess, unchanged.  No free air.  Additional 6.3 cm gas and fluid collection in the dependent pelvis, worrisome for developing abscess, new.  Mild mesenteric fluid/stranding in the pelvis.  4.7 cm left ovarian cystic lesion, previously 2.4 cm in 2010. Given postmenopausal status, consider follow-up pelvic ultrasound as  an outpatient.  Additional stable ancillary findings as above.   Original Report Authenticated By: Charline Bills, M.D.    Medications: I have reviewed the patient's current medications.  Assessment/Plan: Acute sigmoid diverticulitis and small diverticular abscess (3.4 cm diameter):Repeat CT yesterday shows  possible new abscess. Ask general surgery and ID to see.  Change levaquin back to cipro, antibiotics 8  Atrial Fibrillation Continue amiodarone, metoprolol  and diltiazem, rate under better control Pulmonary: much improved after prednisone and lasix one dose. change bronchodilaters to prn Cystic lesion left ovary on CT: consider OP non-emergent transvaginal and pelvic ultrasound.  HTN: Controlled.  Constipation, change to senekot and miralax bid No Code Blue   LOS: 7 days   [name] JOSEPH 02/02/2013, 6:25 AM

## 2013-02-02 NOTE — Consult Note (Signed)
Agree with A&P of ER,NP. Her abd is distended but soft, and KUB looks more like ileus. Antibiotic changing to Invanz and IR drain to be done.

## 2013-02-02 NOTE — H&P (Signed)
HPI: [name] is an 79 y.o. female with recent history of diverticulitis with small abscess treated conservatively with abx. She has been readmitted with abdominal pain and repeat CT shows persistent diverticulitis with unchanged 3cm abscess, however, new 6cm abscess of gas/fluid has formed in the interim. Surgery has seen the pt and IR is requested to place perc drain. PMHx and meds reviewed.  Past Medical History:  Past Medical History  Diagnosis Date  . Lung cancer 2007    hx stage 1 BAC/ RML. s/p wedge resection from RML, superior segment RLL resection s/p seed implants  . Mycobacterial disease   . Paroxysmal atrial fibrillation   . Bronchiectasis     bronch 10/2009 w/ stenotrophomonas sens to levaquin and bactrim  . Tachycardia-bradycardia syndrome 2008    sp PPM by Dr Amil Amen  . Osteoporosis   . Hypertension   . Hyperlipidemia   . Multinodular goiter (nontoxic)   . Glaucoma   . Anxiety   . Sciatica   . Colonic diverticular abscess 01/26/2013    Past Surgical History:  Past Surgical History  Procedure Laterality Date  . Wedge resection  2007    for lung CA  . Pacemaker insertion  5/13    MDT implanted by Dr Amil Amen  . Laminectomy  1988  . Hernia repair  1988    Family History:  Family History  Problem Relation Age of Onset  . Cancer Sister   . Heart failure Father   . Diabetes Mother     Social History:  reports that she quit smoking about 7 years ago. Her smoking use included Cigarettes. She has a 27.5 pack-year smoking history. She has never used smokeless tobacco. She reports that she does not drink alcohol or use illicit drugs.  Allergies: No Known Allergies  Medications: Medications Prior to Admission  Medication Sig Dispense Refill  . amiodarone (PACERONE) 200 MG tablet Take 100 mg by mouth daily.       Marland Kitchen amLODipine (NORVASC) 5 MG tablet Take 5 mg by mouth daily.       Marland Kitchen aspirin EC 81 MG tablet Take 81 mg by mouth at bedtime.       .  budesonide-formoterol (SYMBICORT) 160-4.5 MCG/ACT inhaler Inhale 2 puffs into the lungs 2 (two) times daily as needed (for shortness of breath).      . Calcium Carbonate-Vitamin D (CALCIUM 500/D) 500-125 MG-UNIT TABS Take 1 tablet by mouth daily.       . cycloSPORINE (RESTASIS) 0.05 % ophthalmic emulsion Place 1 drop into both eyes 2 (two) times daily.       . DULoxetine (CYMBALTA) 60 MG capsule Take 60 mg by mouth daily.      Marland Kitchen HYDROcodone-acetaminophen (NORCO/VICODIN) 5-325 MG per tablet Take 1 tablet by mouth every 8 (eight) hours as needed for pain.      Marland Kitchen latanoprost (XALATAN) 0.005 % ophthalmic solution Place 1 drop into both eyes at bedtime.       . metoprolol (LOPRESSOR) 50 MG tablet Take 50 mg by mouth 2 (two) times daily.        . Multiple Vitamin (MULTIVITAMIN WITH MINERALS) TABS Take 1 tablet by mouth daily.      Marland Kitchen neomycin-polymyxin b-dexamethasone (MAXITROL) 3.5-10000-0.1 SUSP Apply 1 drop topically as directed. Uses for 5 days every month, applied to eyelids.      . Olopatadine HCl (PATADAY) 0.2 % SOLN Place 1 drop into both eyes daily.      Marland Kitchen omega-3 acid ethyl  esters (LOVAZA) 1 G capsule Take 1 g by mouth daily.      Marland Kitchen zolpidem (AMBIEN) 10 MG tablet Take 10 mg by mouth at bedtime as needed for sleep.         Please HPI for pertinent positives, otherwise complete 10 system ROS negative.  Physical Exam: BP 121/83  Pulse 96  Temp(Src) 97.4 F (36.3 C) (Oral)  Resp 18  Ht 5' (1.524 m)  Wt 122 lb 2.2 oz (55.4 kg)  BMI 23.85 kg/m2  SpO2 95% Body mass index is 23.85 kg/(m^2).   General Appearance:  Alert, cooperative, no distress, appears stated age  Head:  Normocephalic, without obvious abnormality, atraumatic  ENT: Unremarkable  Neck: Supple, symmetrical, trachea midline  Lungs:   Clear to auscultation bilaterally, no w/r/r, respirations unlabored without use of accessory muscles.  Chest Wall:  No tenderness or deformity  Heart:  Regular rate and rhythm, S1, S2 normal,  no murmur, rub or gallop.  Abdomen:   Soft but protuberant and distended. No peritoneal signs.  Extremities: Extremities normal, atraumatic, no cyanosis or edema  Pulses: 2+ and symmetric  Neurologic: Normal affect, no gross deficits.   Results for orders placed during the hospital encounter of 01/26/13 (from the past 48 hour(s))  CREATININE, SERUM     Status: Abnormal   Collection Time    02/02/13  5:02 AM      Result Value Range   Creatinine, Ser 0.78  0.50 - 1.10 mg/dL   GFR calc non Af Amer 75 (*) >90 mL/min   GFR calc Af Amer 87 (*) >90 mL/min   Comment:            The eGFR has been calculated     using the CKD EPI equation.     This calculation has not been     validated in all clinical     situations.     eGFR's persistently     <90 mL/min signify     possible Chronic Kidney Disease.  BASIC METABOLIC PANEL     Status: Abnormal   Collection Time    02/02/13  5:02 AM      Result Value Range   Sodium 133 (*) 135 - 145 mEq/L   Potassium 3.5  3.5 - 5.1 mEq/L   Chloride 93 (*) 96 - 112 mEq/L   CO2 30  19 - 32 mEq/L   Glucose, Bld 103 (*) 70 - 99 mg/dL   BUN 18  6 - 23 mg/dL   Creatinine, Ser 0.98  0.50 - 1.10 mg/dL   Calcium 8.5  8.4 - 11.9 mg/dL   GFR calc non Af Amer 65 (*) >90 mL/min   GFR calc Af Amer 76 (*) >90 mL/min   Comment:            The eGFR has been calculated     using the CKD EPI equation.     This calculation has not been     validated in all clinical     situations.     eGFR's persistently     <90 mL/min signify     possible Chronic Kidney Disease.  CBC WITH DIFFERENTIAL     Status: Abnormal   Collection Time    02/02/13  5:02 AM      Result Value Range   WBC 14.3 (*) 4.0 - 10.5 K/uL   RBC 4.26  3.87 - 5.11 MIL/uL   Hemoglobin 12.2  12.0 - 15.0 g/dL   HCT  37.5  36.0 - 46.0 %   MCV 88.0  78.0 - 100.0 fL   MCH 28.6  26.0 - 34.0 pg   MCHC 32.5  30.0 - 36.0 g/dL   RDW 78.2  95.6 - 21.3 %   Platelets 424 (*) 150 - 400 K/uL   Neutrophils Relative  % 77  43 - 77 %   Neutro Abs 11.1 (*) 1.7 - 7.7 K/uL   Lymphocytes Relative 10 (*) 12 - 46 %   Lymphs Abs 1.4  0.7 - 4.0 K/uL   Monocytes Relative 11  3 - 12 %   Monocytes Absolute 1.5 (*) 0.1 - 1.0 K/uL   Eosinophils Relative 2  0 - 5 %   Eosinophils Absolute 0.3  0.0 - 0.7 K/uL   Basophils Relative 0  0 - 1 %   Basophils Absolute 0.0  0.0 - 0.1 K/uL   Ct Abdomen Pelvis W Contrast  02/01/2013   *RADIOLOGY REPORT*  Clinical Data: Follow up abscess  CT ABDOMEN AND PELVIS WITH CONTRAST  Technique:  Multidetector CT imaging of the abdomen and pelvis was performed following the standard protocol during bolus administration of intravenous contrast.  Contrast: OMNIPAQUE IOHEXOL 300 MG/ML  SOLN  Comparison: CT abdomen pelvis dated 01/26/2013.  PET CT dated 01/02/2009.  Findings: Stable scarring/postsurgical changes at the right lung base.  Cardiomegaly.  Pacemaker leads, incompletely visualized.  Tiny hypoenhancing right hepatic lesions (series 2/images 24, 31, and 35), likely benign cysts.  Spleen, pancreas, and right adrenal gland are within normal limits.  Mild nodular thickening of the left adrenal gland.  Layering gallstones without associated gallbladder wall thickening. Mild pericholecystic fluid.  No intrahepatic or extrahepatic ductal dilatation.  Kidneys are notable for bilateral renal cysts measuring up to 3.5 cm in the left upper pole and 3.7 cm in the right upper pole. No hydronephrosis.  Sigmoid diverticulitis with associated 3.0 x 2.5 x 2.9 cm pericolonic fluid and gas collection in the left pelvis (series 2/image 55), grossly unchanged.  Additional 3.5 x 6.3 x 6.0 cm gas and fluid collection in the dependent pelvis with associated enhancing rim (series 2/image 58), worrisome for developing abscess, new.  No free air.  Mild mesenteric fluid/stranding in the pelvis.  Extensive atherosclerotic calcifications of the abdominal aorta and branch vessels.  No suspicious abdominopelvic  lymphadenopathy.  Uterus is unremarkable.  4.0 x 4.7 cm fluid density lesion in the left posterior pelvis/adnexa (series 2/image 64), favored to reflect an ovarian cystic lesion, unchanged from recent CT.  This measured 2.4 x 1.8 cm on prior 2010 PET.  2.1 cm cystic lesion in the right adnexa (series 2/image 62), stable since 2010.  Bladder is unremarkable.  Degenerative changes of the visualized thoracolumbar spine.  Grade 1 anterolisthesis of L4 on L5.  IMPRESSION: Sigmoid diverticulitis with associated 3.0 cm pericolonic fluid collection/abscess, unchanged.  No free air.  Additional 6.3 cm gas and fluid collection in the dependent pelvis, worrisome for developing abscess, new.  Mild mesenteric fluid/stranding in the pelvis.  4.7 cm left ovarian cystic lesion, previously 2.4 cm in 2010. Given postmenopausal status, consider follow-up pelvic ultrasound as an outpatient.  Additional stable ancillary findings as above.   Original Report Authenticated By: Charline Bills, M.D.    Assessment/Plan Diverticulitis with abscesses, not improving on conservative therapy. Images have been reviewed and new larger abscess is amenable to perc drainage via transgluteal approach. Small abscess is not amenable to perc drainage. Explained procedure to pt and family, including  risks, complications, use of sedation. Labs reviewed. Consent signed in chart  Brayton El PA-C 02/02/2013, 1:18 PM

## 2013-02-02 NOTE — Consult Note (Signed)
    [facility]     Reason for Consult: diverticular abscess    Referring Physician: Dr. Valentina Lucks  Principal Problem:   Sepsis Active Problems:   FIBRILLATION, ATRIAL   BRONCHIECTASIS   COPD   Pacemaker-[employer]   Colonic diverticular abscess   Hypokalemia   Hyperglycemia   Hypertension   Hyperlipidemia   Cystic lesion left ovary   Leukocytosis, unspecified   Anemia   . amiodarone  100 mg Oral Daily  . aspirin EC  81 mg Oral QHS  . cycloSPORINE  1 drop Both Eyes BID  . diltiazem  120 mg Oral Daily  . DULoxetine  60 mg Oral Daily  . enoxaparin (LOVENOX) injection  40 mg Subcutaneous Q24H  . ertapenem  1 g Intravenous Q24H  . latanoprost  1 drop Both Eyes QHS  . metoprolol  50 mg Oral BID  . olopatadine  1 drop Both Eyes Daily  . polyethylene glycol  17 g Oral BID  . senna-docusate  2 tablet Oral QHS  . sodium chloride  3 mL Intravenous Q12H    Recommendations: I agree with Invanz and have changed Drainage today   Assessment: She has a diverticular abscess, drainage by IR today.    Antibiotics: Invanz day 0 Cipro/flagyl 7 days  HPI: [name] is a 79 y.o. female with diverticulosis and came in 5/13 with abdominal pain. CT scan notable for diverticulitis.  She was started on appropriate therapy with cipro and flagyl but pain persisted and repeat CT with new abscess formation.  To go to IR for drainage today.  Tells me her pain and distention is better overall.  No current fever or chills.     Review of Systems: Pertinent items are noted in HPI.  Past Medical History  Diagnosis Date  . Lung cancer 2007    hx stage 1 BAC/ RML. s/p wedge resection from RML, superior segment RLL resection s/p seed implants  . Mycobacterial disease   . Paroxysmal atrial fibrillation   . Bronchiectasis     bronch 10/2009 w/ stenotrophomonas sens to levaquin and bactrim  . Tachycardia-bradycardia syndrome 2008    sp PPM by Dr Amil Amen  .  Osteoporosis   . Hypertension   . Hyperlipidemia   . Multinodular goiter (nontoxic)   . Glaucoma   . Anxiety   . Sciatica   . Colonic diverticular abscess 01/26/2013    History  Substance Use Topics  . Smoking status: Former Smoker -- 0.50 packs/day for 55 years    Types: Cigarettes    Quit date: 05/20/2005  . Smokeless tobacco: Never Used  . Alcohol Use: No    Family History  Problem Relation Age of Onset  . Cancer Sister   . Heart failure Father   . Diabetes Mother    No Known Allergies  OBJECTIVE: Blood pressure 119/64, pulse 82, temperature 98.2 F (36.8 C), temperature source Oral, resp. rate 16, height 5' (1.524 m), weight 122 lb 2.2 oz (55.4 kg), SpO2 97.00%. General: Awake, alert, nad Skin: no rashes Lungs: CTA B Cor: RRR without m/r/g Abdomen: soft, + mild distention, +normoactive bowel sounds Ext: no edema  Microbiology: No results found for this or any previous visit (from the past 240 hour(s)).  Staci Righter, MD [email or web address] [phone] pager  [phone] cell 02/02/2013, 2:24 PM

## 2013-02-03 DIAGNOSIS — K5900 Constipation, unspecified: Secondary | ICD-10-CM | POA: Diagnosis not present

## 2013-02-03 DIAGNOSIS — K5732 Diverticulitis of large intestine without perforation or abscess without bleeding: Secondary | ICD-10-CM | POA: Diagnosis not present

## 2013-02-03 MED ORDER — POLYETHYLENE GLYCOL 3350 17 G PO PACK
17.0000 g | PACK | Freq: Every day | ORAL | Status: DC
  Administered 2013-02-03 – 2013-02-04 (×2): 17 g via ORAL
  Filled 2013-02-03 (×2): qty 1

## 2013-02-03 NOTE — Progress Notes (Signed)
Subjective: Diverticular abscess drain placed 5/20 Minimally better  Objective: Vital signs in last 24 hours: Temp:  [97.9 F (36.6 C)-99.1 F (37.3 C)] 98.3 F (36.8 C) (05/21 0434) Pulse Rate:  [69-100] 69 (05/21 0434) Resp:  [16-23] 19 (05/21 0434) BP: (100-143)/(54-85) 143/72 mmHg (05/21 0434) SpO2:  [94 %-100 %] 96 % (05/21 0434) Weight:  [120 lb 5.9 oz (54.6 kg)] 120 lb 5.9 oz (54.6 kg) (05/21 0434) Last BM Date: 02/03/13 (small )  Intake/Output from previous day: 05/20 0701 - 05/21 0700 In: 1140 [P.O.:1080; IV Piggyback:50] Out: 45 [Drains:45] Intake/Output this shift: Total I/O In: 365 [P.O.:360; Other:5] Out: -   PE:  Afeb; vss 45 cc output yesterday 20 cc in bag now- cloudy yellow Site clean and dry; tender to touch Cx: hi wbc; pending  Pt ambulating   Lab Results:   Recent Labs  02/02/13 0502  WBC 14.3*  HGB 12.2  HCT 37.5  PLT 424*   BMET  Recent Labs  01/31/13 1140 02/02/13 0502  NA 137 133*  K 3.9 3.5  CL 97 93*  CO2 29 30  GLUCOSE 142* 103*  BUN 16 18  CREATININE 0.67 0.78  0.81  CALCIUM 9.3 8.5   PT/INR  Recent Labs  02/02/13 1326  LABPROT 14.5  INR 1.15   ABG No results found for this basename: PHART, PCO2, PO2, HCO3,  in the last 72 hours  Studies/Results: Dg Abd 1 View  02/02/2013   *RADIOLOGY REPORT*  Clinical Data: Lower abdominal pain with nausea and constipation. Abdomen is firmly distended. Pelvic abscesses.  ABDOMEN - 1 VIEW  Comparison: Scout image for CT scan dated 02/01/2013 and radiographs dated 01/30/2013  Findings: There is extensive CT contrast in the nondistended colon. There are distended small bowel loops in the mid abdomen as demonstrated on the prior CT scan.  This distention is essentially unchanged.  I suspect the patient has a partial small bowel obstruction.  IMPRESSION: New partial small bowel obstruction since 01/26/2013.   Original Report Authenticated By: Francene Boyers, M.D.   Ct Abdomen Pelvis  W Contrast  02/01/2013   *RADIOLOGY REPORT*  Clinical Data: Follow up abscess  CT ABDOMEN AND PELVIS WITH CONTRAST  Technique:  Multidetector CT imaging of the abdomen and pelvis was performed following the standard protocol during bolus administration of intravenous contrast.  Contrast: OMNIPAQUE IOHEXOL 300 MG/ML  SOLN  Comparison: CT abdomen pelvis dated 01/26/2013.  PET CT dated 01/02/2009.  Findings: Stable scarring/postsurgical changes at the right lung base.  Cardiomegaly.  Pacemaker leads, incompletely visualized.  Tiny hypoenhancing right hepatic lesions (series 2/images 24, 31, and 35), likely benign cysts.  Spleen, pancreas, and right adrenal gland are within normal limits.  Mild nodular thickening of the left adrenal gland.  Layering gallstones without associated gallbladder wall thickening. Mild pericholecystic fluid.  No intrahepatic or extrahepatic ductal dilatation.  Kidneys are notable for bilateral renal cysts measuring up to 3.5 cm in the left upper pole and 3.7 cm in the right upper pole. No hydronephrosis.  Sigmoid diverticulitis with associated 3.0 x 2.5 x 2.9 cm pericolonic fluid and gas collection in the left pelvis (series 2/image 55), grossly unchanged.  Additional 3.5 x 6.3 x 6.0 cm gas and fluid collection in the dependent pelvis with associated enhancing rim (series 2/image 58), worrisome for developing abscess, new.  No free air.  Mild mesenteric fluid/stranding in the pelvis.  Extensive atherosclerotic calcifications of the abdominal aorta and branch vessels.  No suspicious abdominopelvic  lymphadenopathy.  Uterus is unremarkable.  4.0 x 4.7 cm fluid density lesion in the left posterior pelvis/adnexa (series 2/image 64), favored to reflect an ovarian cystic lesion, unchanged from recent CT.  This measured 2.4 x 1.8 cm on prior 2010 PET.  2.1 cm cystic lesion in the right adnexa (series 2/image 62), stable since 2010.  Bladder is unremarkable.  Degenerative changes of the  visualized thoracolumbar spine.  Grade 1 anterolisthesis of L4 on L5.  IMPRESSION: Sigmoid diverticulitis with associated 3.0 cm pericolonic fluid collection/abscess, unchanged.  No free air.  Additional 6.3 cm gas and fluid collection in the dependent pelvis, worrisome for developing abscess, new.  Mild mesenteric fluid/stranding in the pelvis.  4.7 cm left ovarian cystic lesion, previously 2.4 cm in 2010. Given postmenopausal status, consider follow-up pelvic ultrasound as an outpatient.  Additional stable ancillary findings as above.   Original Report Authenticated By: Charline Bills, M.D.   Ct Image Guided Drainage Percut Cath  Peritoneal Retroperit  02/03/2013   *RADIOLOGY REPORT*  Clinical data:  Diverticular abscess, enlarging.  CT-GUIDED PELVIC ABSCESS DRAINAGE CATHETER PLACEMENT  Technique and findings: The procedure, risks (including but not limited to bleeding, infection, organ damage), benefits, and alternatives were explained to the patient.  Questions regarding the procedure were encouraged and answered.  The patient understands and consents to the procedure.The patient placed prone and limited scans through the pelvis were obtained.  The collection was localized and an appropriate skin entry site was identified. Operator donned sterile gloves and mask.   Site was marked, prepped with Betadine, draped in usual sterile fashion, infiltrated locally with 1% lidocaine.  Intravenous Fentanyl and Versed were administered as conscious sedation during continuous cardiorespiratory monitoring by the radiology RN, with a total moderate sedation time of 15 minutes.  Under CT fluoroscopic guidance, a 19 gauge percutaneous entry needle was advanced into the pelvic gas and fluid collection using a left transgluteal approach.  Gas could be aspirated.  The Amplatz guide wire advanced easily within the collection, its position confirmed on CT fluoroscopy.  The tract was dilated to facilitate placement of a  12-French pigtail drainage catheter, formed centrally within the collection.  Approximately 5 ml of purulent aspirate were sent for Gram stain, culture and sensitivity.  The catheter was connected to gravity drain bag and secured externally with O-Prolene suture and Statlock. The patient tolerated the procedure well.  No immediate complication.  IMPRESSION: 1.  Technically successful CT guided pelvic abscess drainage catheter placement.   Original Report Authenticated By: D. Andria Rhein, MD    Anti-infectives:   Assessment/Plan: s/p * No surgery found *  Divertic abscess drain placed 5/20 Will follow   LOS: 8 days    Briggs Edelen A 02/03/2013

## 2013-02-03 NOTE — Progress Notes (Signed)
CMT notified RN that pacer spikes were falling in the QRS and patient's tele strip appeared different from previous ones.  Discovered this am that telemetry leads were in the wrong location.  Placed leads in correct position and am strip looks a lot better.  Will continue to monitor patient.

## 2013-02-03 NOTE — Progress Notes (Signed)
Agree with A&P of ER,NP. She is improved following abscess drainage. Since the cavity contained air I'm concerned she may develop fecal fistula over next few days. Fortunately, the infectious process seems well walled off, so no generalized abdominal signs or symptoms.   Will need f/u CT to evaluate success of drainage catheter and whether the original abscess is improving now that she is on Invanz. I would suggest scanning 5-6 days after the drain was placed.

## 2013-02-03 NOTE — Progress Notes (Signed)
Subjective: Feels better, less pain, had bowel movement.  Objective: Vital signs in last 24 hours: Temp:  [97.9 F (36.6 C)-99.1 F (37.3 C)] 98.3 F (36.8 C) (05/21 0434) Pulse Rate:  [69-100] 69 (05/21 0434) Resp:  [16-23] 19 (05/21 0434) BP: (100-143)/(54-85) 143/72 mmHg (05/21 0434) SpO2:  [94 %-100 %] 96 % (05/21 0434) Weight:  [54.6 kg (120 lb 5.9 oz)] 54.6 kg (120 lb 5.9 oz) (05/21 0434) Weight change: -0.8 kg (-1 lb 12.2 oz) Last BM Date: 01/29/13  Intake/Output from previous day: 05/20 0701 - 05/21 0700 In: 1140 [P.O.:1080; IV Piggyback:50] Out: -  Intake/Output this shift:    General appearance: alert and cooperative Resp: clear to auscultation bilaterally Cardio: regular rate and rhythm, S1, S2 normal, no murmur, click, rub or gallop GI: soft, mild tenderness, BS present Extremities: extremities normal, atraumatic, no cyanosis or edema  Lab Results:  Recent Labs  02/02/13 0502  WBC 14.3*  HGB 12.2  HCT 37.5  PLT 424*   BMET  Recent Labs  01/31/13 1140 02/02/13 0502  NA 137 133*  K 3.9 3.5  CL 97 93*  CO2 29 30  GLUCOSE 142* 103*  BUN 16 18  CREATININE 0.67 0.78  0.81  CALCIUM 9.3 8.5    Studies/Results: Dg Abd 1 View  02/02/2013   *RADIOLOGY REPORT*  Clinical Data: Lower abdominal pain with nausea and constipation. Abdomen is firmly distended. Pelvic abscesses.  ABDOMEN - 1 VIEW  Comparison: Scout image for CT scan dated 02/01/2013 and radiographs dated 01/30/2013  Findings: There is extensive CT contrast in the nondistended colon. There are distended small bowel loops in the mid abdomen as demonstrated on the prior CT scan.  This distention is essentially unchanged.  I suspect the patient has a partial small bowel obstruction.  IMPRESSION: New partial small bowel obstruction since 01/26/2013.   Original Report Authenticated By: Francene Boyers, M.D.   Ct Abdomen Pelvis W Contrast  02/01/2013   *RADIOLOGY REPORT*  Clinical Data: Follow up abscess   CT ABDOMEN AND PELVIS WITH CONTRAST  Technique:  Multidetector CT imaging of the abdomen and pelvis was performed following the standard protocol during bolus administration of intravenous contrast.  Contrast: OMNIPAQUE IOHEXOL 300 MG/ML  SOLN  Comparison: CT abdomen pelvis dated 01/26/2013.  PET CT dated 01/02/2009.  Findings: Stable scarring/postsurgical changes at the right lung base.  Cardiomegaly.  Pacemaker leads, incompletely visualized.  Tiny hypoenhancing right hepatic lesions (series 2/images 24, 31, and 35), likely benign cysts.  Spleen, pancreas, and right adrenal gland are within normal limits.  Mild nodular thickening of the left adrenal gland.  Layering gallstones without associated gallbladder wall thickening. Mild pericholecystic fluid.  No intrahepatic or extrahepatic ductal dilatation.  Kidneys are notable for bilateral renal cysts measuring up to 3.5 cm in the left upper pole and 3.7 cm in the right upper pole. No hydronephrosis.  Sigmoid diverticulitis with associated 3.0 x 2.5 x 2.9 cm pericolonic fluid and gas collection in the left pelvis (series 2/image 55), grossly unchanged.  Additional 3.5 x 6.3 x 6.0 cm gas and fluid collection in the dependent pelvis with associated enhancing rim (series 2/image 58), worrisome for developing abscess, new.  No free air.  Mild mesenteric fluid/stranding in the pelvis.  Extensive atherosclerotic calcifications of the abdominal aorta and branch vessels.  No suspicious abdominopelvic lymphadenopathy.  Uterus is unremarkable.  4.0 x 4.7 cm fluid density lesion in the left posterior pelvis/adnexa (series 2/image 64), favored to reflect an ovarian  cystic lesion, unchanged from recent CT.  This measured 2.4 x 1.8 cm on prior 2010 PET.  2.1 cm cystic lesion in the right adnexa (series 2/image 62), stable since 2010.  Bladder is unremarkable.  Degenerative changes of the visualized thoracolumbar spine.  Grade 1 anterolisthesis of L4 on L5.  IMPRESSION:  Sigmoid diverticulitis with associated 3.0 cm pericolonic fluid collection/abscess, unchanged.  No free air.  Additional 6.3 cm gas and fluid collection in the dependent pelvis, worrisome for developing abscess, new.  Mild mesenteric fluid/stranding in the pelvis.  4.7 cm left ovarian cystic lesion, previously 2.4 cm in 2010. Given postmenopausal status, consider follow-up pelvic ultrasound as an outpatient.  Additional stable ancillary findings as above.   Original Report Authenticated By: Charline Bills, M.D.    Medications: I have reviewed the patient's current medications.  Assessment/Plan: Acute sigmoid diverticulitis and small diverticular abscess (3.4 cm diameter):Drain in place, appreciate surgery and ID input, antibiotics 9, invanz 2, fluid culture pending.  ID, recommendations for home antibiotics? Atrial Fibrillation Continue amiodarone, metoprolol and diltiazem, rate under better control  Pulmonary: much improved after prednisone and lasix one dose. changed bronchodilaters to prn  Cystic lesion left ovary on CT: consider OP non-emergent transvaginal and pelvic ultrasound.  HTN: Controlled.  Constipation, changed to senekot and miralax qd, having bowel movement, low residue diet No Code [place] to discharge tomorrow with home nursing, PT,OT   LOS: 8 days   [name] 02/03/2013, 7:21 AM

## 2013-02-03 NOTE — Progress Notes (Signed)
Occupational Therapy Treatment Patient Details Name: [name] MRN: [id number] DOB: 06-May-1927 Today's Date: 02/03/2013 Time: 4401-0272 OT Time Calculation (min): 11 min  OT Assessment / Plan / Recommendation Comments on Treatment Session Pt mostly mod I for adls.  Will have help as needed as daughter lives very close.  Recommended daughter be there when she showers, and she can borrow a seat for increased safety    Follow Up Recommendations  No OT follow up    Barriers to Discharge       Equipment Recommendations  None recommended by OT    Recommendations for Other Services    Frequency     Plan Discharge plan needs to be updated    Precautions / Restrictions Precautions Precautions: Fall Restrictions Weight Bearing Restrictions: No   Pertinent Vitals/Pain No pain reported    ADL  Grooming: Performed;Wash/dry hands;Modified independent Where Assessed - Grooming: Unsupported standing Lower Body Dressing: Performed;Modified independent Where Assessed - Lower Body Dressing: Unsupported sit to stand Toilet Transfer: Performed;Modified independent Toilet Transfer Method: Sit to Barista: Comfort height toilet Toileting - Clothing Manipulation and Hygiene: Performed;Modified independent Where Assessed - Toileting Clothing Manipulation and Hygiene: Standing Tub/Shower Transfer: Performed;Supervision/safety (for safety) Tub/Shower Transfer Method: Ambulating Transfers/Ambulation Related to ADLs: pt is able to reach to retrieve adl items but daughter lives nearby and she will set things out for pt ADL Comments: recommended pt have supervision for shower transfers.  She states she can borrow a seat from a family member.    OT Diagnosis:    OT Problem List:   OT Treatment Interventions:     OT Goals ADL Goals Pt Will Perform Grooming: with modified independence;Standing at sink ADL Goal: Grooming - Progress: Met Pt Will Perform Upper Body  Dressing: with modified independence;Sit to stand from chair ADL Goal: Upper Body Dressing - Progress:  (not performed; pt feels she is independnent) Pt Will Perform Lower Body Dressing: with modified independence;Sit to stand from bed ADL Goal: Lower Body Dressing - Progress: Met (simulated--pt mod I) Pt Will Transfer to Toilet: with modified independence;Comfort height toilet ADL Goal: Toilet Transfer - Progress: Met Pt Will Perform Toileting - Clothing Manipulation: with modified independence;Standing ADL Goal: Toileting - Clothing Manipulation - Progress: Partly met Pt Will Perform Tub/Shower Transfer: Shower transfer;with modified independence ADL Goal: Tub/Shower Transfer - Progress: Other (comment) (recommend supervision for safety)  Visit Information  Last OT Received On: 02/03/13 Assistance Needed: +1    Subjective Data      Prior Functioning       Cognition  Cognition Arousal/Alertness: Awake/alert Behavior During Therapy: WFL for tasks assessed/performed Overall Cognitive Status: Within Functional Limits for tasks assessed    Mobility  Bed Mobility Supine to Sit: 6: Modified independent (Device/Increase time);HOB flat Transfers Sit to Stand: 6: Modified independent (Device/Increase time);From bed;With upper extremity assist    Exercises      Balance     End of Session OT - End of Session Activity Tolerance: Patient tolerated treatment well Patient left: in bed;with call bell/phone within reach Nurse Communication:  (arm band )  GO     Adan Baehr 02/03/2013, 2:41 PM Marica Otter, OTR/L [phone] 02/03/2013

## 2013-02-03 NOTE — Progress Notes (Signed)
Patient ID: [name], female   DOB: May 30, 1927, 79 y.o.   MRN: [id number]  [name] [id number] April 10, 1927  CARE TEAM:  PCP: Lillia Mountain, MD  Outpatient Care Team: Patient Care Team: Lillia Mountain, MD as PCP - General (Internal Medicine)  Subjective: Pt feeling better today.  Small bm last night, +flatus.  Denies nausea or vomiting.  Denies fever, chills or shortness of breath.  Had CT guided perc drain placement, 5ml of purulent output, culture is pending.  Objective:  Vital signs:  Filed Vitals:   02/02/13 1630 02/02/13 1700 02/02/13 2036 02/03/13 0434  BP: 118/67 100/54 127/79 143/72  Pulse: 88 88 81 69  Temp: 98.3 F (36.8 C) 98.3 F (36.8 C) 99.1 F (37.3 C) 98.3 F (36.8 C)  TempSrc: Oral Oral Oral Oral  Resp: 18 18 18 19   Height:      Weight:    120 lb 5.9 oz (54.6 kg)  SpO2:  98% 94% 96%    Last BM Date: 01/29/13, 02/01/13  Intake/Output   Yesterday:  05/20 0701 - 05/21 0700 In: 1140 [P.O.:1080; IV Piggyback:50] Out: -  This shift:    Bowel function:  Flatus: +  BM: 5/19  Drain: purulent, milky output total 45ml  Physical Exam:  General: Pt awake/alert/oriented x3 in  No acute distress Lymph: No head/neck/groin lymphadenopathy Psych:  No delerium/psychosis/paranoia HENT: Normocephalic, Mucus membranes moist.  No thrush Neck: Supple, No tracheal deviation Chest: CTA, no wheezes or crackles.   No chest wall pain w good excursion CV:  S1S2 regularly irregular.  No murmurs, gallops or rubs.  Pulses intact.  No edema. MS: Normal AROM mjr joints.  No obvious deformity Abdomen: Soft, round and distended.  Abdomen is mildly tender in the pelvic region.  No evidence of peritonitis.  No incarcerated hernias or masses. Ext:  SCDs BLE.  No mjr edema.  No cyanosis Skin: No petechiae / purpura   Problem List:   Principal Problem:   Sepsis Active Problems:   FIBRILLATION, ATRIAL   BRONCHIECTASIS   COPD    Pacemaker-Medtronic   Colonic diverticular abscess   Hypokalemia   Hyperglycemia   Hypertension   Hyperlipidemia   Cystic lesion left ovary   Leukocytosis, unspecified   Anemia    Results:   Labs: Results for orders placed during the hospital encounter of 01/26/13 (from the past 48 hour(s))  CREATININE, SERUM     Status: Abnormal   Collection Time    02/02/13  5:02 AM      Result Value Range   Creatinine, Ser 0.78  0.50 - 1.10 mg/dL   GFR calc non Af Amer 75 (*) >90 mL/min   GFR calc Af Amer 87 (*) >90 mL/min   Comment:            The eGFR has been calculated     using the CKD EPI equation.     This calculation has not been     validated in all clinical     situations.     eGFR's persistently     <90 mL/min signify     possible Chronic Kidney Disease.  BASIC METABOLIC PANEL     Status: Abnormal   Collection Time    02/02/13  5:02 AM      Result Value Range   Sodium 133 (*) 135 - 145 mEq/L   Potassium 3.5  3.5 - 5.1 mEq/L   Chloride 93 (*) 96 - 112 mEq/L   CO2  30  19 - 32 mEq/L   Glucose, Bld 103 (*) 70 - 99 mg/dL   BUN 18  6 - 23 mg/dL   Creatinine, Ser 1.61  0.50 - 1.10 mg/dL   Calcium 8.5  8.4 - 09.6 mg/dL   GFR calc non Af Amer 65 (*) >90 mL/min   GFR calc Af Amer 76 (*) >90 mL/min   Comment:            The eGFR has been calculated     using the CKD EPI equation.     This calculation has not been     validated in all clinical     situations.     eGFR's persistently     <90 mL/min signify     possible Chronic Kidney Disease.  CBC WITH DIFFERENTIAL     Status: Abnormal   Collection Time    02/02/13  5:02 AM      Result Value Range   WBC 14.3 (*) 4.0 - 10.5 K/uL   RBC 4.26  3.87 - 5.11 MIL/uL   Hemoglobin 12.2  12.0 - 15.0 g/dL   HCT 04.5  40.9 - 81.1 %   MCV 88.0  78.0 - 100.0 fL   MCH 28.6  26.0 - 34.0 pg   MCHC 32.5  30.0 - 36.0 g/dL   RDW 91.4  78.2 - 95.6 %   Platelets 424 (*) 150 - 400 K/uL   Neutrophils Relative % 77  43 - 77 %   Neutro Abs  11.1 (*) 1.7 - 7.7 K/uL   Lymphocytes Relative 10 (*) 12 - 46 %   Lymphs Abs 1.4  0.7 - 4.0 K/uL   Monocytes Relative 11  3 - 12 %   Monocytes Absolute 1.5 (*) 0.1 - 1.0 K/uL   Eosinophils Relative 2  0 - 5 %   Eosinophils Absolute 0.3  0.0 - 0.7 K/uL   Basophils Relative 0  0 - 1 %   Basophils Absolute 0.0  0.0 - 0.1 K/uL  PROTIME-INR     Status: None   Collection Time    02/02/13  1:26 PM      Result Value Range   Prothrombin Time 14.5  11.6 - 15.2 seconds   INR 1.15  0.00 - 1.49    Imaging / Studies: Dg Abd 1 View  02/02/2013   *RADIOLOGY REPORT*  Clinical Data: Lower abdominal pain with nausea and constipation. Abdomen is firmly distended. Pelvic abscesses.  ABDOMEN - 1 VIEW  Comparison: Scout image for CT scan dated 02/01/2013 and radiographs dated 01/30/2013  Findings: There is extensive CT contrast in the nondistended colon. There are distended small bowel loops in the mid abdomen as demonstrated on the prior CT scan.  This distention is essentially unchanged.  I suspect the patient has a partial small bowel obstruction.  IMPRESSION: New partial small bowel obstruction since 01/26/2013.   Original Report Authenticated By: Francene Boyers, M.D.   Ct Abdomen Pelvis W Contrast  02/01/2013   *RADIOLOGY REPORT*  Clinical Data: Follow up abscess  CT ABDOMEN AND PELVIS WITH CONTRAST  Technique:  Multidetector CT imaging of the abdomen and pelvis was performed following the standard protocol during bolus administration of intravenous contrast.  Contrast: OMNIPAQUE IOHEXOL 300 MG/ML  SOLN  Comparison: CT abdomen pelvis dated 01/26/2013.  PET CT dated 01/02/2009.  Findings: Stable scarring/postsurgical changes at the right lung base.  Cardiomegaly.  Pacemaker leads, incompletely visualized.  Tiny hypoenhancing right hepatic lesions (series  2/images 24, 31, and 35), likely benign cysts.  Spleen, pancreas, and right adrenal gland are within normal limits.  Mild nodular thickening of the left  adrenal gland.  Layering gallstones without associated gallbladder wall thickening. Mild pericholecystic fluid.  No intrahepatic or extrahepatic ductal dilatation.  Kidneys are notable for bilateral renal cysts measuring up to 3.5 cm in the left upper pole and 3.7 cm in the right upper pole. No hydronephrosis.  Sigmoid diverticulitis with associated 3.0 x 2.5 x 2.9 cm pericolonic fluid and gas collection in the left pelvis (series 2/image 55), grossly unchanged.  Additional 3.5 x 6.3 x 6.0 cm gas and fluid collection in the dependent pelvis with associated enhancing rim (series 2/image 58), worrisome for developing abscess, new.  No free air.  Mild mesenteric fluid/stranding in the pelvis.  Extensive atherosclerotic calcifications of the abdominal aorta and branch vessels.  No suspicious abdominopelvic lymphadenopathy.  Uterus is unremarkable.  4.0 x 4.7 cm fluid density lesion in the left posterior pelvis/adnexa (series 2/image 64), favored to reflect an ovarian cystic lesion, unchanged from recent CT.  This measured 2.4 x 1.8 cm on prior 2010 PET.  2.1 cm cystic lesion in the right adnexa (series 2/image 62), stable since 2010.  Bladder is unremarkable.  Degenerative changes of the visualized thoracolumbar spine.  Grade 1 anterolisthesis of L4 on L5.  IMPRESSION: Sigmoid diverticulitis with associated 3.0 cm pericolonic fluid collection/abscess, unchanged.  No free air.  Additional 6.3 cm gas and fluid collection in the dependent pelvis, worrisome for developing abscess, new.  Mild mesenteric fluid/stranding in the pelvis.  4.7 cm left ovarian cystic lesion, previously 2.4 cm in 2010. Given postmenopausal status, consider follow-up pelvic ultrasound as an outpatient.  Additional stable ancillary findings as above.   Original Report Authenticated By: Charline Bills, M.D.    Medications / Allergies: per chart  Antibiotics: Anti-infectives   Start     Dose/Rate Route Frequency Ordered Stop   02/02/13 1600   ertapenem (INVANZ) 1 g in sodium chloride 0.9 % 50 mL IVPB     1 g 100 mL/hr over 30 Minutes Intravenous Every 24 hours 02/02/13 1423     02/02/13 0800  ciprofloxacin (CIPRO) tablet 500 mg  Status:  Discontinued     500 mg Oral 2 times daily 02/02/13 4098 02/02/13 1423   02/01/13 1000  levofloxacin (LEVAQUIN) tablet 250 mg  Status:  Discontinued     250 mg Oral Daily 02/01/13 0634 02/02/13 0632   02/01/13 0800  metroNIDAZOLE (FLAGYL) tablet 500 mg  Status:  Discontinued     500 mg Oral 3 times per day 02/01/13 0634 02/02/13 1423   01/30/13 1000  Levofloxacin (LEVAQUIN) IVPB 250 mg  Status:  Discontinued     250 mg 50 mL/hr over 60 Minutes Intravenous Every 24 hours 01/29/13 0743 02/01/13 0634   01/29/13 0900  levofloxacin (LEVAQUIN) IVPB 500 mg     500 mg 100 mL/hr over 60 Minutes Intravenous  Once 01/29/13 0739 01/29/13 0944   01/27/13 0400  ciprofloxacin (CIPRO) IVPB 400 mg  Status:  Discontinued     400 mg 200 mL/hr over 60 Minutes Intravenous Every 12 hours 01/26/13 1723 01/29/13 0739   01/27/13 0100  metroNIDAZOLE (FLAGYL) IVPB 500 mg  Status:  Discontinued     500 mg 100 mL/hr over 60 Minutes Intravenous Every 8 hours 01/26/13 1723 02/01/13 0634   01/26/13 1530  ciprofloxacin (CIPRO) IVPB 400 mg     400 mg 200 mL/hr over 60  Minutes Intravenous  Once 01/26/13 1516 01/26/13 1702   01/26/13 1530  metroNIDAZOLE (FLAGYL) IVPB 500 mg     500 mg 100 mL/hr over 60 Minutes Intravenous  Once 01/26/13 1516 01/26/13 1802      Assessment/Plan Sigmoid Diverticulitis w/ 3.4cm abscess and newly formed 6.3cm fluid collection(1st episode) -perc drain placement 5/19.  -advance diet -pain control -continue with IV atbx  VTE prophylaxis -SCDs, lovenox  And mobilize as tolerated.  Ashok Norris, [facility] Surgery Pager [phone] Office [phone]  02/03/2013 7:46 AM

## 2013-02-03 NOTE — Progress Notes (Signed)
[facility]  Date of Admission:  01/26/2013  Antibiotics: Invanz day 2  Subjective: No significant pain, no fever, no chills  Objective: Temp:  [97.9 F (36.6 C)-99.1 F (37.3 C)] 98.3 F (36.8 C) (05/21 0434) Pulse Rate:  [69-100] 69 (05/21 0434) Resp:  [16-23] 19 (05/21 0434) BP: (100-143)/(54-85) 143/72 mmHg (05/21 0434) SpO2:  [94 %-100 %] 96 % (05/21 0434) Weight:  [120 lb 5.9 oz (54.6 kg)] 120 lb 5.9 oz (54.6 kg) (05/21 0434)  General: Awake, alert Skin: no rashes Lungs: CTA b Cor: RRR without m/r/g Abdomen: + drain in place   Lab Results Lab Results  Component Value Date   WBC 14.3* 02/02/2013   HGB 12.2 02/02/2013   HCT 37.5 02/02/2013   MCV 88.0 02/02/2013   PLT 424* 02/02/2013    Lab Results  Component Value Date   CREATININE 0.78 02/02/2013   CREATININE 0.81 02/02/2013   BUN 18 02/02/2013   NA 133* 02/02/2013   K 3.5 02/02/2013   CL 93* 02/02/2013   CO2 30 02/02/2013    Lab Results  Component Value Date   ALT 19 01/26/2013   AST 30 01/26/2013   ALKPHOS 71 01/26/2013   BILITOT 0.8 01/26/2013      Microbiology: Recent Results (from the past 240 hour(s))  CULTURE, ROUTINE-ABSCESS     Status: None   Collection Time    02/02/13  3:22 PM      Result Value Range Status   Specimen Description DRAINAGE   Final   Special Requests Normal   Final   Gram Stain     Final   Value: ABUNDANT WBC PRESENT, PREDOMINANTLY PMN     NO SQUAMOUS EPITHELIAL CELLS SEEN     FEW GRAM POSITIVE RODS   Culture PENDING   Incomplete   Report Status PENDING   Incomplete    Studies/Results: Dg Abd 1 View  02/02/2013   *RADIOLOGY REPORT*  Clinical Data: Lower abdominal pain with nausea and constipation. Abdomen is firmly distended. Pelvic abscesses.  ABDOMEN - 1 VIEW  Comparison: Scout image for CT scan dated 02/01/2013 and radiographs dated 01/30/2013  Findings: There is extensive CT contrast in the nondistended colon. There are distended small bowel  loops in the mid abdomen as demonstrated on the prior CT scan.  This distention is essentially unchanged.  I suspect the patient has a partial small bowel obstruction.  IMPRESSION: New partial small bowel obstruction since 01/26/2013.   Original Report Authenticated By: Francene Boyers, M.D.   Ct Abdomen Pelvis W Contrast  02/01/2013   *RADIOLOGY REPORT*  Clinical Data: Follow up abscess  CT ABDOMEN AND PELVIS WITH CONTRAST  Technique:  Multidetector CT imaging of the abdomen and pelvis was performed following the standard protocol during bolus administration of intravenous contrast.  Contrast: OMNIPAQUE IOHEXOL 300 MG/ML  SOLN  Comparison: CT abdomen pelvis dated 01/26/2013.  PET CT dated 01/02/2009.  Findings: Stable scarring/postsurgical changes at the right lung base.  Cardiomegaly.  Pacemaker leads, incompletely visualized.  Tiny hypoenhancing right hepatic lesions (series 2/images 24, 31, and 35), likely benign cysts.  Spleen, pancreas, and right adrenal gland are within normal limits.  Mild nodular thickening of the left adrenal gland.  Layering gallstones without associated gallbladder wall thickening. Mild pericholecystic fluid.  No intrahepatic or extrahepatic ductal dilatation.  Kidneys are notable for bilateral renal cysts measuring up to 3.5 cm in the left upper pole and 3.7 cm in the right upper pole.  No hydronephrosis.  Sigmoid diverticulitis with associated 3.0 x 2.5 x 2.9 cm pericolonic fluid and gas collection in the left pelvis (series 2/image 55), grossly unchanged.  Additional 3.5 x 6.3 x 6.0 cm gas and fluid collection in the dependent pelvis with associated enhancing rim (series 2/image 58), worrisome for developing abscess, new.  No free air.  Mild mesenteric fluid/stranding in the pelvis.  Extensive atherosclerotic calcifications of the abdominal aorta and branch vessels.  No suspicious abdominopelvic lymphadenopathy.  Uterus is unremarkable.  4.0 x 4.7 cm fluid density lesion in the  left posterior pelvis/adnexa (series 2/image 64), favored to reflect an ovarian cystic lesion, unchanged from recent CT.  This measured 2.4 x 1.8 cm on prior 2010 PET.  2.1 cm cystic lesion in the right adnexa (series 2/image 62), stable since 2010.  Bladder is unremarkable.  Degenerative changes of the visualized thoracolumbar spine.  Grade 1 anterolisthesis of L4 on L5.  IMPRESSION: Sigmoid diverticulitis with associated 3.0 cm pericolonic fluid collection/abscess, unchanged.  No free air.  Additional 6.3 cm gas and fluid collection in the dependent pelvis, worrisome for developing abscess, new.  Mild mesenteric fluid/stranding in the pelvis.  4.7 cm left ovarian cystic lesion, previously 2.4 cm in 2010. Given postmenopausal status, consider follow-up pelvic ultrasound as an outpatient.  Additional stable ancillary findings as above.   Original Report Authenticated By: Charline Bills, M.D.   Ct Image Guided Drainage Percut Cath  Peritoneal Retroperit  02/03/2013   *RADIOLOGY REPORT*  Clinical data:  Diverticular abscess, enlarging.  CT-GUIDED PELVIC ABSCESS DRAINAGE CATHETER PLACEMENT  Technique and findings: The procedure, risks (including but not limited to bleeding, infection, organ damage), benefits, and alternatives were explained to the patient.  Questions regarding the procedure were encouraged and answered.  The patient understands and consents to the procedure.The patient placed prone and limited scans through the pelvis were obtained.  The collection was localized and an appropriate skin entry site was identified. Operator donned sterile gloves and mask.   Site was marked, prepped with Betadine, draped in usual sterile fashion, infiltrated locally with 1% lidocaine.  Intravenous Fentanyl and Versed were administered as conscious sedation during continuous cardiorespiratory monitoring by the radiology RN, with a total moderate sedation time of 15 minutes.  Under CT fluoroscopic guidance, a 19 gauge  percutaneous entry needle was advanced into the pelvic gas and fluid collection using a left transgluteal approach.  Gas could be aspirated.  The Amplatz guide wire advanced easily within the collection, its position confirmed on CT fluoroscopy.  The tract was dilated to facilitate placement of a 12-French pigtail drainage catheter, formed centrally within the collection.  Approximately 5 ml of purulent aspirate were sent for Gram stain, culture and sensitivity.  The catheter was connected to gravity drain bag and secured externally with O-Prolene suture and Statlock. The patient tolerated the procedure well.  No immediate complication.  IMPRESSION: 1.  Technically successful CT guided pelvic abscess drainage catheter placement.   Original Report Authenticated By: D. Andria Rhein, MD    Assessment/Plan: 1) diverticular abscess - draining, on Invanz.  No new positive cultures.  Gram positive rods in gram stain.   -duration of antibiotics will depend on resolution -follow up CT scan per surgery  Staci Righter, MD [facility] [email or web address] [phone] pager   [phone] cell 02/03/2013, 11:47 AM

## 2013-02-04 ENCOUNTER — Inpatient Hospital Stay (HOSPITAL_COMMUNITY): Payer: Medicare Other

## 2013-02-04 DIAGNOSIS — Z9889 Other specified postprocedural states: Secondary | ICD-10-CM | POA: Diagnosis not present

## 2013-02-04 DIAGNOSIS — J90 Pleural effusion, not elsewhere classified: Secondary | ICD-10-CM | POA: Diagnosis not present

## 2013-02-04 DIAGNOSIS — K5732 Diverticulitis of large intestine without perforation or abscess without bleeding: Secondary | ICD-10-CM | POA: Diagnosis not present

## 2013-02-04 DIAGNOSIS — K5900 Constipation, unspecified: Secondary | ICD-10-CM | POA: Diagnosis not present

## 2013-02-04 LAB — CBC
HCT: 37.8 % (ref 36.0–46.0)
Hemoglobin: 12 g/dL (ref 12.0–15.0)
MCH: 28.2 pg (ref 26.0–34.0)
MCV: 88.9 fL (ref 78.0–100.0)
Platelets: 436 10*3/uL — ABNORMAL HIGH (ref 150–400)
RBC: 4.25 MIL/uL (ref 3.87–5.11)

## 2013-02-04 MED ORDER — HYDROCODONE-ACETAMINOPHEN 5-325 MG PO TABS: 1.0000 | ORAL_TABLET | ORAL | Status: DC | PRN

## 2013-02-04 MED ORDER — SODIUM CHLORIDE 0.9 % IV SOLN: 1.0000 g | INTRAVENOUS | Status: DC

## 2013-02-04 MED ORDER — SENNOSIDES-DOCUSATE SODIUM 8.6-50 MG PO TABS: 2.0000 | ORAL_TABLET | Freq: Every day | ORAL | Status: DC

## 2013-02-04 MED ORDER — SODIUM CHLORIDE 0.9 % IJ SOLN: 10.0000 mL | INTRAMUSCULAR | Status: DC | PRN

## 2013-02-04 MED ORDER — POLYETHYLENE GLYCOL 3350 17 G PO PACK: 17.0000 g | PACK | Freq: Two times a day (BID) | ORAL | Status: DC | PRN

## 2013-02-04 MED ORDER — DILTIAZEM HCL ER COATED BEADS 120 MG PO CP24: 120.0000 mg | ORAL_CAPSULE | Freq: Every day | ORAL | Status: DC

## 2013-02-04 NOTE — Progress Notes (Signed)
Peripherally Inserted Central Catheter/Midline Placement  The IV Nurse has discussed with the patient and/or persons authorized to consent for the patient, the purpose of this procedure and the potential benefits and risks involved with this procedure.  The benefits include less needle sticks, lab draws from the catheter and patient may be discharged home with the catheter.  Risks include, but not limited to, infection, bleeding, blood clot (thrombus formation), and puncture of an artery; nerve damage and irregular heat beat.  Alternatives to this procedure were also discussed.  PICC/Midline Placement Documentation        [name] 02/04/2013, 8:31 AM

## 2013-02-04 NOTE — Discharge Summary (Signed)
Physician Discharge Summary  Patient ID: [name] MRN: [id number] DOB/AGE: 77/17/1928 77 y.o.  Admit date: 01/26/2013 Discharge date: 02/04/2013  Admission Diagnoses: Acute diverticulitis Diverticular abscess Atrial fibrillation COPD Bronchiectasis Hypertension   Discharge Diagnoses:  Principal Problem:   Acute diverticulitis Active Problems: Diverticular abscess   FIBRILLATION, ATRIAL   BRONCHIECTASIS   COPD exacerbation   Pacemaker-Medtronic   Hypokalemia   Hypertension   Hyperlipidemia   Cystic lesion left ovary   Discharged Condition: good  Hospital Course: The patient was admitted on may the 13th complaining of abdominal pain and vomiting. A CT scan of the ER showed diverticulitis with a fluid collection suspicious for small diverticular abscess measuring 3.4 cm in diameter, new cystic lesion left ovary and a few small gallstones. The patient was admitted and placed on IV Cipro and Flagyl. She was given medication for pain control. She developed wheezing and dyspnea and some mild respiratory distress. A chest x-ray showed enlargement of cardiac silhouette, patchy infiltrates right greater than left and possible partially loculated right pleural effusion. The patient was switched from Cipro to Levaquin. She was given one dose of IV Lasix. She was given 4 days of prednisone and nebulized bronchodilators. Her wheezing and respiratory distress markedly improved. Her WBC gradually began to drop. We advanced her diet. A repeat CT of the abdomen and pelvis was done on May 19 showing sigmoid diverticulitis with associated 3 cm pericolonic fluid collection, unchanged and a new 6.3 cm gas and fluid collection in the pelvis worrisome for developing abscess as well as a 4.7 cm left ovarian cystic lesion which had enlarged from 2.4 cm in 2010. The patient was seen by general surgery and infectious disease. General surgery recommended percutaneous drainage of the new larger abscess and  this was accomplished by interventional radiology on May 19. The patient did have significant quantities of white green drainage from the abscess which was cultured, that is pending. Her white count dropped to normal at 8.8. She was seen by infectious disease service who recommended switching her antibiotics from Cipro/Flagyl  To invanz and treating as an outpatient until resolution of the abscess. Plan is for repeat of the CT scan to reevaluate abscess were made for May 27 with followup 2 days later. Home nursing for IV antibiotic administration and drain care and home PT and OT were arranged. The patient did have some constipation treated with laxatives with some success. Hypokalemia was corrected. She did initially have normal sinus rhythm but after 4 days of admission developed atrial fibrillation with rate running to 101 -110. Metoprolol was continued, amlodipine was discontinued and diltiazem at low dose was started. Still in atrial fibrillation at discharge her rate was in the 70s to 80s. She remains on aspirin, we have opted not to anticoagulate this point. The patient opted for no CODE BLUE status during this admission.  Discharge diet: Low residue CODE STATUS: NO CODE BLUE Home health with home health RN, OT, PT    Consults: ID and general surgery  Significant Diagnostic Studies: labs: At discharge, CBC WBC 8.8, hemoglobin 12.0, platelet count 436, basic metabolic profile sodium 133, potassium 3.5, chloride 93, bicarbonate 30, glucose 103, BUN 18, creatinine 21 microbiology: Abscess culture, pending and radiology: CT scan: Abdomen and pelvis, as above  Treatments: IV hydration, antibiotics: Levaquin, Cipro, metronidazole and invanz, analgesia: Vicodin and Dilaudid, cardiac meds: metoprolol and diltiazem and procedures: percutaneous drainage catheter placement  Discharge Exam: Blood pressure 124/51, pulse 73, temperature 98.4 F (36.9 C),  temperature source Oral, resp. rate 18, height 5'  (1.524 m), weight 54.6 kg (120 lb 5.9 oz), SpO2 96.00%. Resp: clear to auscultation bilaterally Cardio: irregularly irregular rhythm GI: Soft, improved distention, mild tenderness  Disposition: 01-Home or Self Care   Future Appointments Provider Department Dept Phone   05/21/2013 2:00 PM Barbaraann Share, MD [facility] [phone]       Medication List    STOP taking these medications       amLODipine 5 MG tablet  Commonly known as:  NORVASC     Calcium 500/D 500-125 MG-UNIT Tabs  Generic drug:  Calcium Carbonate-Vitamin D      TAKE these medications       amiodarone 200 MG tablet  Commonly known as:  PACERONE  Take 100 mg by mouth daily.     aspirin EC 81 MG tablet  Take 81 mg by mouth at bedtime.     budesonide-formoterol 160-4.5 MCG/ACT inhaler  Commonly known as:  SYMBICORT  Inhale 2 puffs into the lungs 2 (two) times daily as needed (for shortness of breath).     cycloSPORINE 0.05 % ophthalmic emulsion  Commonly known as:  RESTASIS  Place 1 drop into both eyes 2 (two) times daily.     diltiazem 120 MG 24 hr capsule  Commonly known as:  CARDIZEM CD  Take 1 capsule (120 mg total) by mouth daily.     DULoxetine 60 MG capsule  Commonly known as:  CYMBALTA  Take 60 mg by mouth daily.     HYDROcodone-acetaminophen 5-325 MG per tablet  Commonly known as:  NORCO/VICODIN  Take 1-2 tablets by mouth every 4 (four) hours as needed.     HYDROcodone-acetaminophen 5-325 MG per tablet  Commonly known as:  NORCO/VICODIN  Take 1 tablet by mouth every 8 (eight) hours as needed for pain.     metoprolol 50 MG tablet  Commonly known as:  LOPRESSOR  Take 50 mg by mouth 2 (two) times daily.     multivitamin with minerals Tabs  Take 1 tablet by mouth daily.     neomycin-polymyxin b-dexamethasone 3.5-10000-0.1 Susp  Commonly known as:  MAXITROL  Apply 1 drop topically as directed. Uses for 5 days every month, applied to eyelids.     omega-3 acid ethyl esters 1 G  capsule  Commonly known as:  LOVAZA  Take 1 g by mouth daily.     PATADAY 0.2 % Soln  Generic drug:  Olopatadine HCl  Place 1 drop into both eyes daily.     polyethylene glycol packet  Commonly known as:  MIRALAX / GLYCOLAX  Take 17 g by mouth 2 (two) times daily as needed.     senna-docusate 8.6-50 MG per tablet  Commonly known as:  Senokot-S  Take 2 tablets by mouth at bedtime.     sodium chloride 0.9 % SOLN 50 mL with ertapenem 1 G SOLR 1 g  Inject 1 g into the vein daily.     XALATAN 0.005 % ophthalmic solution  Generic drug:  latanoprost  Place 1 drop into both eyes at bedtime.     zolpidem 10 MG tablet  Commonly known as:  AMBIEN  Take 10 mg by mouth at bedtime as needed for sleep.           Follow-up Information   Follow up In 7 days.      Follow up with Lillia Mountain, MD.   Contact information:   [address], [address] Jaynie Crumble [place] [address] [phone]       Signed: Lillia Mountain 02/04/2013, 7:53 AM

## 2013-02-04 NOTE — Progress Notes (Signed)
Pt discharged home via family; Pt and family given and explained all discharge instructions, carenotes, and prescriptions; pt and family stated understanding and denied questions/concerns; all f/u appointments in place; IV removed without complicaitons; pt stable at time of discharge; specific care instructions given on drain care as well as low fiber diet; daughter at bedside with patient; stated understanding and returned demonstration on drain flush and emptying;

## 2013-02-04 NOTE — Progress Notes (Signed)
Agree with A&P of ER,NP. The patient just had a PICC placed for antibiotic therapy. Discussed need for F/U C/T to determine success of drain and Invanz in resoloving her siverticulitis with abscesses. She does not want surgery at all and doesn't really want to follow up with [place]. I discussed with Dr Valentina Lucks and he will review next CT (scheduled for Tuesday) and call me if needed.

## 2013-02-04 NOTE — Progress Notes (Signed)
Patient ID: [name], female   DOB: [birth year], 79 y.o.   MRN: [id number]  [name] [id number] [birth year]  CARE TEAM:  PCP: Lillia Mountain, MD  Outpatient Care Team: Patient Care Team: Lillia Mountain, MD as PCP - General (Internal Medicine)  Subjective: Pt states she is ready to go home today.  She was able to tolerate diet last night. Has been having small bm's +flatus.    Objective:  Vital signs:  Filed Vitals:   02/02/13 2036 02/03/13 0434 02/03/13 2113 02/04/13 0510  BP: 127/79 143/72 116/65 124/51  Pulse: 81 69 90 73  Temp: 99.1 F (37.3 C) 98.3 F (36.8 C) 98.4 F (36.9 C) 98.4 F (36.9 C)  TempSrc: Oral Oral Oral Oral  Resp: 18 19 16 18   Height:      Weight:  120 lb 5.9 oz (54.6 kg)    SpO2: 94% 96% 98% 96%    Last BM Date: 02/04/13  Intake/Output   Yesterday:  05/21 0701 - 05/22 0700 In: 1250 [P.O.:1180; IV Piggyback:50] Out: 0  This shift:  Total I/O In: 5 [Other:5] Out: 10 [Drains:10]  Physical Exam:  General: Pt awake/alert/oriented x3 in No acute distress  Psych: No delerium/psychosis/paranoia.  Calm and cooperative.  Chest: CTA, no wheezes or crackles. No chest wall pain w good excursion  CV: S1S2 regularly irregular. No murmurs, gallops or rubs. Pulses intact. No edema.  Abdomen: Soft, round and distended. Abdomen is mildly tender in the pelvic region. No evidence of peritonitis. No incarcerated hernias or masses.  Ext: SCDs BLE. No mjr edema. No cyanosis  Skin: No petechiae / purpura    Problem List:   Principal Problem:   Sepsis Active Problems:   FIBRILLATION, ATRIAL   BRONCHIECTASIS   COPD   Pacemaker-[employer]   Colonic diverticular abscess   Hypokalemia   Hyperglycemia   Hypertension   Hyperlipidemia   Cystic lesion left ovary   Leukocytosis, unspecified   Anemia    Results:   Labs: Results for orders placed during the hospital encounter of 01/26/13 (from the past 48 hour(s))   PROTIME-INR     Status: None   Collection Time    02/02/13  1:26 PM      Result Value Range   Prothrombin Time 14.5  11.6 - 15.2 seconds   INR 1.15  0.00 - 1.49  CULTURE, ROUTINE-ABSCESS     Status: None   Collection Time    02/02/13  3:22 PM      Result Value Range   Specimen Description DRAINAGE     Special Requests Normal     Gram Stain       Value: ABUNDANT WBC PRESENT, PREDOMINANTLY PMN     NO SQUAMOUS EPITHELIAL CELLS SEEN     FEW GRAM POSITIVE RODS   Culture Culture reincubated for better growth     Report Status PENDING    CBC     Status: Abnormal   Collection Time    02/04/13  5:15 AM      Result Value Range   WBC 8.8  4.0 - 10.5 K/uL   RBC 4.25  3.87 - 5.11 MIL/uL   Hemoglobin 12.0  12.0 - 15.0 g/dL   HCT 78.2  95.6 - 21.3 %   MCV 88.9  78.0 - 100.0 fL   MCH 28.2  26.0 - 34.0 pg   MCHC 31.7  30.0 - 36.0 g/dL   RDW 08.6  57.8 - 46.9 %   Platelets  436 (*) 150 - 400 K/uL    Imaging / Studies: Dg Abd 1 View  02/02/2013   *RADIOLOGY REPORT*  Clinical Data: Lower abdominal pain with nausea and constipation. Abdomen is firmly distended. Pelvic abscesses.  ABDOMEN - 1 VIEW  Comparison: Scout image for CT scan dated 02/01/2013 and radiographs dated 01/30/2013  Findings: There is extensive CT contrast in the nondistended colon. There are distended small bowel loops in the mid abdomen as demonstrated on the prior CT scan.  This distention is essentially unchanged.  I suspect the patient has a partial small bowel obstruction.  IMPRESSION: New partial small bowel obstruction since 01/26/2013.   Original Report Authenticated By: Francene Boyers, M.D.   Ct Image Guided Drainage Percut Cath  Peritoneal Retroperit  02/03/2013   *RADIOLOGY REPORT*  Clinical data:  Diverticular abscess, enlarging.  CT-GUIDED PELVIC ABSCESS DRAINAGE CATHETER PLACEMENT  Technique and findings: The procedure, risks (including but not limited to bleeding, infection, organ damage), benefits, and alternatives  were explained to the patient.  Questions regarding the procedure were encouraged and answered.  The patient understands and consents to the procedure.The patient placed prone and limited scans through the pelvis were obtained.  The collection was localized and an appropriate skin entry site was identified. Operator donned sterile gloves and mask.   Site was marked, prepped with Betadine, draped in usual sterile fashion, infiltrated locally with 1% lidocaine.  Intravenous Fentanyl and Versed were administered as conscious sedation during continuous cardiorespiratory monitoring by the radiology RN, with a total moderate sedation time of 15 minutes.  Under CT fluoroscopic guidance, a 19 gauge percutaneous entry needle was advanced into the pelvic gas and fluid collection using a left transgluteal approach.  Gas could be aspirated.  The Amplatz guide wire advanced easily within the collection, its position confirmed on CT fluoroscopy.  The tract was dilated to facilitate placement of a 12-French pigtail drainage catheter, formed centrally within the collection.  Approximately 5 ml of purulent aspirate were sent for Gram stain, culture and sensitivity.  The catheter was connected to gravity drain bag and secured externally with O-Prolene suture and Statlock. The patient tolerated the procedure well.  No immediate complication.  IMPRESSION: 1.  Technically successful CT guided pelvic abscess drainage catheter placement.   Original Report Authenticated By: D. Andria Rhein, MD    Medications / Allergies: per chart  Antibiotics: Anti-infectives   Start     Dose/Rate Route Frequency Ordered Stop   02/04/13 0000  sodium chloride 0.9 % SOLN 50 mL with ertapenem 1 G SOLR 1 g     1 g 100 mL/hr over 30 Minutes Intravenous Every 24 hours 02/04/13 0751     02/02/13 1600  ertapenem (INVANZ) 1 g in sodium chloride 0.9 % 50 mL IVPB     1 g 100 mL/hr over 30 Minutes Intravenous Every 24 hours 02/02/13 1423     02/02/13  0800  ciprofloxacin (CIPRO) tablet 500 mg  Status:  Discontinued     500 mg Oral 2 times daily 02/02/13 0632 02/02/13 1423   02/01/13 1000  levofloxacin (LEVAQUIN) tablet 250 mg  Status:  Discontinued     250 mg Oral Daily 02/01/13 0634 02/02/13 0632   02/01/13 0800  metroNIDAZOLE (FLAGYL) tablet 500 mg  Status:  Discontinued     500 mg Oral 3 times per day 02/01/13 0634 02/02/13 1423   01/30/13 1000  Levofloxacin (LEVAQUIN) IVPB 250 mg  Status:  Discontinued     250 mg 50  mL/hr over 60 Minutes Intravenous Every 24 hours 01/29/13 0743 02/01/13 0634   01/29/13 0900  levofloxacin (LEVAQUIN) IVPB 500 mg     500 mg 100 mL/hr over 60 Minutes Intravenous  Once 01/29/13 0739 01/29/13 0944   01/27/13 0400  ciprofloxacin (CIPRO) IVPB 400 mg  Status:  Discontinued     400 mg 200 mL/hr over 60 Minutes Intravenous Every 12 hours 01/26/13 1723 01/29/13 0739   01/27/13 0100  metroNIDAZOLE (FLAGYL) IVPB 500 mg  Status:  Discontinued     500 mg 100 mL/hr over 60 Minutes Intravenous Every 8 hours 01/26/13 1723 02/01/13 0634   01/26/13 1530  ciprofloxacin (CIPRO) IVPB 400 mg     400 mg 200 mL/hr over 60 Minutes Intravenous  Once 01/26/13 1516 01/26/13 1702   01/26/13 1530  metroNIDAZOLE (FLAGYL) IVPB 500 mg     500 mg 100 mL/hr over 60 Minutes Intravenous  Once 01/26/13 1516 01/26/13 1802      Assessment/Plan Sigmoid Diverticulitis w/ 3.4cm abscess and newly formed 6.3cm fluid collection(1st episode) discharge home with IV atbx, perc drain.  Repeat CT on Tuesday which will be arranged by PCP.  Ms. Norbeck is declining surgery and future follow up.  She may call our office to follow up with Dr. Jamey Ripa at anytime.  Ashok Norris, [facility] Surgery Pager [phone] Office [phone]  02/04/2013 8:42 AM

## 2013-02-04 NOTE — Progress Notes (Signed)
Discussed with Dr. Bradly Chris, radiologist the position the PICC.   He suggested pulling it back 1cm and getting a repeat chest x-ray.

## 2013-02-05 DIAGNOSIS — Z452 Encounter for adjustment and management of vascular access device: Secondary | ICD-10-CM | POA: Diagnosis not present

## 2013-02-05 DIAGNOSIS — I10 Essential (primary) hypertension: Secondary | ICD-10-CM | POA: Diagnosis not present

## 2013-02-05 DIAGNOSIS — Z792 Long term (current) use of antibiotics: Secondary | ICD-10-CM | POA: Diagnosis not present

## 2013-02-05 DIAGNOSIS — I4891 Unspecified atrial fibrillation: Secondary | ICD-10-CM | POA: Diagnosis not present

## 2013-02-05 DIAGNOSIS — K630 Abscess of intestine: Secondary | ICD-10-CM | POA: Diagnosis not present

## 2013-02-05 DIAGNOSIS — Z950 Presence of cardiac pacemaker: Secondary | ICD-10-CM | POA: Diagnosis not present

## 2013-02-05 DIAGNOSIS — M6281 Muscle weakness (generalized): Secondary | ICD-10-CM | POA: Diagnosis not present

## 2013-02-05 DIAGNOSIS — J471 Bronchiectasis with (acute) exacerbation: Secondary | ICD-10-CM | POA: Diagnosis not present

## 2013-02-05 DIAGNOSIS — J441 Chronic obstructive pulmonary disease with (acute) exacerbation: Secondary | ICD-10-CM | POA: Diagnosis not present

## 2013-02-05 DIAGNOSIS — Z5181 Encounter for therapeutic drug level monitoring: Secondary | ICD-10-CM | POA: Diagnosis not present

## 2013-02-05 DIAGNOSIS — K5732 Diverticulitis of large intestine without perforation or abscess without bleeding: Secondary | ICD-10-CM | POA: Diagnosis not present

## 2013-02-05 LAB — CULTURE, ROUTINE-ABSCESS: Special Requests: NORMAL

## 2013-02-06 DIAGNOSIS — Z5181 Encounter for therapeutic drug level monitoring: Secondary | ICD-10-CM | POA: Diagnosis not present

## 2013-02-06 DIAGNOSIS — K5732 Diverticulitis of large intestine without perforation or abscess without bleeding: Secondary | ICD-10-CM | POA: Diagnosis not present

## 2013-02-06 DIAGNOSIS — Z452 Encounter for adjustment and management of vascular access device: Secondary | ICD-10-CM | POA: Diagnosis not present

## 2013-02-06 DIAGNOSIS — J441 Chronic obstructive pulmonary disease with (acute) exacerbation: Secondary | ICD-10-CM | POA: Diagnosis not present

## 2013-02-06 DIAGNOSIS — J471 Bronchiectasis with (acute) exacerbation: Secondary | ICD-10-CM | POA: Diagnosis not present

## 2013-02-06 DIAGNOSIS — K630 Abscess of intestine: Secondary | ICD-10-CM | POA: Diagnosis not present

## 2013-02-09 ENCOUNTER — Other Ambulatory Visit: Payer: Self-pay | Admitting: Internal Medicine

## 2013-02-09 ENCOUNTER — Ambulatory Visit
Admission: RE | Admit: 2013-02-09 | Discharge: 2013-02-09 | Disposition: A | Discharge status: Home / Self Care | Payer: Medicare Other | Source: Ambulatory Visit | Attending: Internal Medicine | Admitting: Internal Medicine

## 2013-02-09 DIAGNOSIS — K5732 Diverticulitis of large intestine without perforation or abscess without bleeding: Secondary | ICD-10-CM | POA: Diagnosis not present

## 2013-02-09 DIAGNOSIS — K630 Abscess of intestine: Secondary | ICD-10-CM | POA: Diagnosis not present

## 2013-02-09 DIAGNOSIS — Z5181 Encounter for therapeutic drug level monitoring: Secondary | ICD-10-CM | POA: Diagnosis not present

## 2013-02-09 DIAGNOSIS — L0291 Cutaneous abscess, unspecified: Secondary | ICD-10-CM

## 2013-02-09 DIAGNOSIS — J471 Bronchiectasis with (acute) exacerbation: Secondary | ICD-10-CM | POA: Diagnosis not present

## 2013-02-09 DIAGNOSIS — K5730 Diverticulosis of large intestine without perforation or abscess without bleeding: Secondary | ICD-10-CM | POA: Diagnosis not present

## 2013-02-09 DIAGNOSIS — J441 Chronic obstructive pulmonary disease with (acute) exacerbation: Secondary | ICD-10-CM | POA: Diagnosis not present

## 2013-02-09 DIAGNOSIS — N83209 Unspecified ovarian cyst, unspecified side: Secondary | ICD-10-CM | POA: Diagnosis not present

## 2013-02-09 DIAGNOSIS — Z452 Encounter for adjustment and management of vascular access device: Secondary | ICD-10-CM | POA: Diagnosis not present

## 2013-02-09 MED ORDER — IOHEXOL 300 MG/ML  SOLN
75.0000 mL | Freq: Once | INTRAMUSCULAR | Status: AC | PRN
  Administered 2013-02-09: 75 mL via INTRAVENOUS

## 2013-02-11 DIAGNOSIS — I4891 Unspecified atrial fibrillation: Secondary | ICD-10-CM | POA: Diagnosis not present

## 2013-02-11 DIAGNOSIS — G8929 Other chronic pain: Secondary | ICD-10-CM | POA: Diagnosis not present

## 2013-02-11 DIAGNOSIS — J441 Chronic obstructive pulmonary disease with (acute) exacerbation: Secondary | ICD-10-CM | POA: Diagnosis not present

## 2013-02-11 DIAGNOSIS — K5732 Diverticulitis of large intestine without perforation or abscess without bleeding: Secondary | ICD-10-CM | POA: Diagnosis not present

## 2013-02-12 ENCOUNTER — Ambulatory Visit (HOSPITAL_COMMUNITY)
Admission: RE | Admit: 2013-02-12 | Discharge: 2013-02-12 | Disposition: A | Discharge status: Home / Self Care | Payer: Medicare Other | Source: Ambulatory Visit | Attending: Internal Medicine | Admitting: Internal Medicine

## 2013-02-12 ENCOUNTER — Other Ambulatory Visit (HOSPITAL_COMMUNITY): Payer: Self-pay | Admitting: Internal Medicine

## 2013-02-12 DIAGNOSIS — J471 Bronchiectasis with (acute) exacerbation: Secondary | ICD-10-CM | POA: Diagnosis not present

## 2013-02-12 DIAGNOSIS — Z4889 Encounter for other specified surgical aftercare: Secondary | ICD-10-CM | POA: Insufficient documentation

## 2013-02-12 DIAGNOSIS — Z5181 Encounter for therapeutic drug level monitoring: Secondary | ICD-10-CM | POA: Diagnosis not present

## 2013-02-12 DIAGNOSIS — Z452 Encounter for adjustment and management of vascular access device: Secondary | ICD-10-CM | POA: Diagnosis not present

## 2013-02-12 DIAGNOSIS — L0291 Cutaneous abscess, unspecified: Secondary | ICD-10-CM

## 2013-02-12 DIAGNOSIS — K651 Peritoneal abscess: Secondary | ICD-10-CM | POA: Insufficient documentation

## 2013-02-12 DIAGNOSIS — K630 Abscess of intestine: Secondary | ICD-10-CM | POA: Diagnosis not present

## 2013-02-12 DIAGNOSIS — K5732 Diverticulitis of large intestine without perforation or abscess without bleeding: Secondary | ICD-10-CM | POA: Diagnosis not present

## 2013-02-12 DIAGNOSIS — J441 Chronic obstructive pulmonary disease with (acute) exacerbation: Secondary | ICD-10-CM | POA: Diagnosis not present

## 2013-02-12 NOTE — Progress Notes (Signed)
Patient ID: [name], female   DOB: 04/25/1927, 79 y.o.   MRN: [id number]   Pelvic abscess drain placed 02/03/13  dc'd home with drain Output minimal Re scan 5/27 shows resolution of abscess  afeb Scant output x 2-3 days Drain removal without complication 5/30

## 2013-02-16 ENCOUNTER — Inpatient Hospital Stay: Payer: Medicare Other | Admitting: Internal Medicine

## 2013-02-17 DIAGNOSIS — J471 Bronchiectasis with (acute) exacerbation: Secondary | ICD-10-CM | POA: Diagnosis not present

## 2013-02-17 DIAGNOSIS — Z452 Encounter for adjustment and management of vascular access device: Secondary | ICD-10-CM | POA: Diagnosis not present

## 2013-02-17 DIAGNOSIS — J441 Chronic obstructive pulmonary disease with (acute) exacerbation: Secondary | ICD-10-CM | POA: Diagnosis not present

## 2013-02-17 DIAGNOSIS — Z5181 Encounter for therapeutic drug level monitoring: Secondary | ICD-10-CM | POA: Diagnosis not present

## 2013-02-17 DIAGNOSIS — K630 Abscess of intestine: Secondary | ICD-10-CM | POA: Diagnosis not present

## 2013-02-17 DIAGNOSIS — K5732 Diverticulitis of large intestine without perforation or abscess without bleeding: Secondary | ICD-10-CM | POA: Diagnosis not present

## 2013-03-16 DIAGNOSIS — I4891 Unspecified atrial fibrillation: Secondary | ICD-10-CM | POA: Diagnosis not present

## 2013-03-16 DIAGNOSIS — Z950 Presence of cardiac pacemaker: Secondary | ICD-10-CM | POA: Diagnosis not present

## 2013-03-17 DIAGNOSIS — I4891 Unspecified atrial fibrillation: Secondary | ICD-10-CM | POA: Diagnosis not present

## 2013-03-17 DIAGNOSIS — Z7901 Long term (current) use of anticoagulants: Secondary | ICD-10-CM | POA: Diagnosis not present

## 2013-04-29 DIAGNOSIS — I10 Essential (primary) hypertension: Secondary | ICD-10-CM | POA: Diagnosis not present

## 2013-04-29 DIAGNOSIS — I4891 Unspecified atrial fibrillation: Secondary | ICD-10-CM | POA: Diagnosis not present

## 2013-04-29 DIAGNOSIS — Z950 Presence of cardiac pacemaker: Secondary | ICD-10-CM | POA: Diagnosis not present

## 2013-04-29 DIAGNOSIS — Z1331 Encounter for screening for depression: Secondary | ICD-10-CM | POA: Diagnosis not present

## 2013-04-29 DIAGNOSIS — Z79899 Other long term (current) drug therapy: Secondary | ICD-10-CM | POA: Diagnosis not present

## 2013-04-29 DIAGNOSIS — E785 Hyperlipidemia, unspecified: Secondary | ICD-10-CM | POA: Diagnosis not present

## 2013-04-29 DIAGNOSIS — Z0000 Encounter for general adult medical examination without abnormal findings: Secondary | ICD-10-CM | POA: Diagnosis not present

## 2013-05-21 ENCOUNTER — Encounter: Payer: Self-pay | Admitting: Pulmonary Disease

## 2013-05-21 ENCOUNTER — Ambulatory Visit (INDEPENDENT_AMBULATORY_CARE_PROVIDER_SITE_OTHER): Payer: Medicare Other | Admitting: Pulmonary Disease

## 2013-05-21 VITALS — BP 134/82 | HR 73 | Temp 98.4°F | Ht 60.0 in | Wt 106.6 lb

## 2013-05-21 DIAGNOSIS — A310 Pulmonary mycobacterial infection: Secondary | ICD-10-CM | POA: Diagnosis not present

## 2013-05-21 DIAGNOSIS — J479 Bronchiectasis, uncomplicated: Secondary | ICD-10-CM | POA: Diagnosis not present

## 2013-05-21 DIAGNOSIS — J449 Chronic obstructive pulmonary disease, unspecified: Secondary | ICD-10-CM | POA: Diagnosis not present

## 2013-05-21 NOTE — Progress Notes (Signed)
  Subjective:    Patient ID: [name], female    DOB: [birth year], 79 y.o.   MRN: [id number]  HPI The patient comes in today for followup of her known bronchiectasis with associated COPD.  She is staying on her bronchodilator regimen, and has not seen a change in her exertional tolerance since last visit.  She denies any chest congestion, and her cough and mucus have actually improved.  Her weight has decreased about 10 pounds from 6 months ago, and she states it may be related to a hospitalization for diverticulitis.   Review of Systems  Constitutional: Positive for appetite change ( pt states that things dont taste good) and unexpected weight change. Negative for fever.  HENT: Negative for ear pain, nosebleeds, congestion, sore throat, rhinorrhea, sneezing, trouble swallowing, dental problem, postnasal drip and sinus pressure.   Eyes: Negative for redness and itching.  Respiratory: Positive for shortness of breath. Negative for cough, chest tightness and wheezing.   Cardiovascular: Negative for palpitations and leg swelling.  Gastrointestinal: Negative for nausea and vomiting.  Genitourinary: Negative for dysuria.  Musculoskeletal: Negative for joint swelling.  Skin: Negative for rash.  Neurological: Negative for headaches.  Hematological: Does not bruise/bleed easily.  Psychiatric/Behavioral: Negative for dysphoric mood. The patient is not nervous/anxious.        Objective:   Physical Exam Female in no acute distress Nose without purulent discharge noted Neck without lymphadenopathy or thyromegaly Chest with good breath sounds bilaterally, faint basilar crackles. Cardiac exam regular rate and rhythm Lower extremities without edema, no cyanosis Alert and oriented, moves all 4 extremities.        Assessment & Plan:

## 2013-05-21 NOTE — Assessment & Plan Note (Signed)
There is no history today to suggest an issue with her bronchiectasis.  She has no significant cough or mucus production.

## 2013-05-21 NOTE — Assessment & Plan Note (Signed)
The patient has a history of MAC colonization, however there is no evidence for increased activity based on history and exam.  I am concerned about her weight loss, and asked her to keep track of this over the next few months.  We need to make sure this is not secondary to a flare of her MAC, or a recurrence of her lung cancer.

## 2013-05-21 NOTE — Patient Instructions (Addendum)
No change in breathing medications.   Work on exercise program and endurance Watch weight, and if still decreasing, let me and your primary doctor know.  followup with me in 6mos.

## 2013-05-21 NOTE — Assessment & Plan Note (Signed)
The patient's exertional tolerance is at baseline, and she has no bronchospasm on exam.  I've asked her to continue on her maintenance inhaler regimen.

## 2013-06-17 ENCOUNTER — Telehealth: Payer: Self-pay | Admitting: Pulmonary Disease

## 2013-06-17 MED ORDER — BUDESONIDE-FORMOTEROL FUMARATE 160-4.5 MCG/ACT IN AERO: 2.0000 | INHALATION_SPRAY | Freq: Two times a day (BID) | RESPIRATORY_TRACT | Status: DC | PRN

## 2013-06-17 NOTE — Telephone Encounter (Signed)
I spoke with pt and is aware RX has been sent. Nothing further needed 

## 2013-06-21 DIAGNOSIS — H01009 Unspecified blepharitis unspecified eye, unspecified eyelid: Secondary | ICD-10-CM | POA: Diagnosis not present

## 2013-06-21 DIAGNOSIS — H353190 Nonexudative age-related macular degeneration, unspecified eye, stage unspecified: Secondary | ICD-10-CM | POA: Diagnosis not present

## 2013-06-21 DIAGNOSIS — H04129 Dry eye syndrome of unspecified lacrimal gland: Secondary | ICD-10-CM | POA: Diagnosis not present

## 2013-06-21 DIAGNOSIS — H4011X0 Primary open-angle glaucoma, stage unspecified: Secondary | ICD-10-CM | POA: Diagnosis not present

## 2013-06-21 DIAGNOSIS — H409 Unspecified glaucoma: Secondary | ICD-10-CM | POA: Diagnosis not present

## 2013-06-28 ENCOUNTER — Encounter: Payer: Medicare Other | Admitting: *Deleted

## 2013-07-12 ENCOUNTER — Encounter: Payer: Self-pay | Admitting: *Deleted

## 2013-07-19 ENCOUNTER — Ambulatory Visit (INDEPENDENT_AMBULATORY_CARE_PROVIDER_SITE_OTHER): Payer: Medicare Other | Admitting: *Deleted

## 2013-07-19 DIAGNOSIS — Z950 Presence of cardiac pacemaker: Secondary | ICD-10-CM

## 2013-07-19 DIAGNOSIS — I495 Sick sinus syndrome: Secondary | ICD-10-CM | POA: Diagnosis not present

## 2013-07-20 LAB — REMOTE PACEMAKER DEVICE
ATRIAL PACING PM: 0
BATTERY VOLTAGE: 2.79 V
BRDY-0002RV: 60 {beats}/min
BRDY-0004RV: 120 {beats}/min
VENTRICULAR PACING PM: 18

## 2013-07-27 ENCOUNTER — Encounter: Payer: Self-pay | Admitting: Cardiology

## 2013-07-30 ENCOUNTER — Encounter: Payer: Self-pay | Admitting: Internal Medicine

## 2013-09-16 ENCOUNTER — Other Ambulatory Visit: Payer: Self-pay | Admitting: *Deleted

## 2013-09-16 DIAGNOSIS — Z79899 Other long term (current) drug therapy: Secondary | ICD-10-CM

## 2013-09-16 DIAGNOSIS — I4891 Unspecified atrial fibrillation: Secondary | ICD-10-CM

## 2013-09-24 ENCOUNTER — Other Ambulatory Visit: Payer: Medicare Other

## 2013-09-26 ENCOUNTER — Other Ambulatory Visit: Payer: Self-pay | Admitting: Cardiology

## 2013-09-26 ENCOUNTER — Other Ambulatory Visit: Payer: Self-pay | Admitting: Pulmonary Disease

## 2013-09-30 ENCOUNTER — Other Ambulatory Visit (INDEPENDENT_AMBULATORY_CARE_PROVIDER_SITE_OTHER): Payer: Medicare Other

## 2013-09-30 DIAGNOSIS — Z79899 Other long term (current) drug therapy: Secondary | ICD-10-CM | POA: Diagnosis not present

## 2013-09-30 DIAGNOSIS — I4891 Unspecified atrial fibrillation: Secondary | ICD-10-CM | POA: Diagnosis not present

## 2013-09-30 LAB — RENAL FUNCTION PANEL
Albumin: 3.7 g/dL (ref 3.5–5.2)
BUN: 31 mg/dL — ABNORMAL HIGH (ref 6–23)
CALCIUM: 9.3 mg/dL (ref 8.4–10.5)
CO2: 32 mEq/L (ref 19–32)
CREATININE: 1.3 mg/dL — AB (ref 0.4–1.2)
Chloride: 96 mEq/L (ref 96–112)
GFR: 42.95 mL/min — AB (ref >60.00)
GLUCOSE: 94 mg/dL (ref 70–99)
PHOSPHORUS: 4 mg/dL (ref 2.3–4.6)
Potassium: 3.2 mEq/L — ABNORMAL LOW (ref 3.5–5.1)
SODIUM: 137 meq/L (ref 135–145)

## 2013-09-30 LAB — HEMOGLOBIN AND HEMATOCRIT, BLOOD
HCT: 49.8 % — ABNORMAL HIGH (ref 36.0–46.0)
HEMOGLOBIN: 16.4 g/dL — AB (ref 12.0–15.0)

## 2013-10-13 ENCOUNTER — Telehealth: Payer: Self-pay | Admitting: *Deleted

## 2013-10-13 NOTE — Telephone Encounter (Signed)
Patient is out of amiodarone and would like [facility]. Thanks, MI

## 2013-10-14 MED ORDER — AMIODARONE HCL 200 MG PO TABS
200.0000 mg | ORAL_TABLET | Freq: Every day | ORAL | Status: DC
Start: 2013-10-14 — End: 2013-11-08

## 2013-10-28 ENCOUNTER — Ambulatory Visit: Payer: Medicare Other | Admitting: Cardiology

## 2013-11-01 ENCOUNTER — Ambulatory Visit
Admission: RE | Admit: 2013-11-01 | Discharge: 2013-11-01 | Disposition: A | Discharge status: Home / Self Care | Payer: Medicare Other | Source: Ambulatory Visit | Attending: Internal Medicine | Admitting: Internal Medicine

## 2013-11-01 ENCOUNTER — Other Ambulatory Visit: Payer: Self-pay | Admitting: Internal Medicine

## 2013-11-01 DIAGNOSIS — C3490 Malignant neoplasm of unspecified part of unspecified bronchus or lung: Secondary | ICD-10-CM

## 2013-11-01 DIAGNOSIS — R634 Abnormal weight loss: Secondary | ICD-10-CM | POA: Diagnosis not present

## 2013-11-01 DIAGNOSIS — E042 Nontoxic multinodular goiter: Secondary | ICD-10-CM | POA: Diagnosis not present

## 2013-11-01 DIAGNOSIS — R7309 Other abnormal glucose: Secondary | ICD-10-CM | POA: Diagnosis not present

## 2013-11-01 DIAGNOSIS — I10 Essential (primary) hypertension: Secondary | ICD-10-CM | POA: Diagnosis not present

## 2013-11-01 DIAGNOSIS — R918 Other nonspecific abnormal finding of lung field: Secondary | ICD-10-CM | POA: Diagnosis not present

## 2013-11-01 DIAGNOSIS — Z23 Encounter for immunization: Secondary | ICD-10-CM | POA: Diagnosis not present

## 2013-11-02 ENCOUNTER — Other Ambulatory Visit: Payer: Self-pay | Admitting: Internal Medicine

## 2013-11-02 ENCOUNTER — Ambulatory Visit
Admission: RE | Admit: 2013-11-02 | Discharge: 2013-11-02 | Disposition: A | Discharge status: Home / Self Care | Payer: Medicare Other | Source: Ambulatory Visit | Attending: Internal Medicine | Admitting: Internal Medicine

## 2013-11-02 ENCOUNTER — Ambulatory Visit: Payer: Medicare Other | Admitting: Cardiology

## 2013-11-02 DIAGNOSIS — R918 Other nonspecific abnormal finding of lung field: Secondary | ICD-10-CM | POA: Diagnosis not present

## 2013-11-02 DIAGNOSIS — R222 Localized swelling, mass and lump, trunk: Secondary | ICD-10-CM

## 2013-11-08 ENCOUNTER — Encounter (INDEPENDENT_AMBULATORY_CARE_PROVIDER_SITE_OTHER): Payer: Self-pay

## 2013-11-08 ENCOUNTER — Encounter: Payer: Self-pay | Admitting: Internal Medicine

## 2013-11-08 ENCOUNTER — Ambulatory Visit (INDEPENDENT_AMBULATORY_CARE_PROVIDER_SITE_OTHER): Payer: Medicare Other | Admitting: Internal Medicine

## 2013-11-08 VITALS — BP 142/88 | HR 73 | Ht 60.0 in | Wt 98.2 lb

## 2013-11-08 DIAGNOSIS — Z950 Presence of cardiac pacemaker: Secondary | ICD-10-CM | POA: Diagnosis not present

## 2013-11-08 DIAGNOSIS — E042 Nontoxic multinodular goiter: Secondary | ICD-10-CM | POA: Diagnosis not present

## 2013-11-08 DIAGNOSIS — I495 Sick sinus syndrome: Secondary | ICD-10-CM | POA: Diagnosis not present

## 2013-11-08 DIAGNOSIS — I10 Essential (primary) hypertension: Secondary | ICD-10-CM

## 2013-11-08 DIAGNOSIS — I4891 Unspecified atrial fibrillation: Secondary | ICD-10-CM | POA: Diagnosis not present

## 2013-11-08 LAB — MDC_IDC_ENUM_SESS_TYPE_INCLINIC
Battery Impedance: 110 Ohm
Battery Remaining Longevity: 130 mo
Battery Voltage: 2.79 V
Brady Statistic RV Percent Paced: 74 %
Lead Channel Impedance Value: 415 Ohm
Lead Channel Setting Pacing Amplitude: 2.5 V
Lead Channel Setting Sensing Sensitivity: 5.6 mV
MDC IDC MSMT LEADCHNL RA SENSING INTR AMPL: 1.4 mV
MDC IDC MSMT LEADCHNL RV IMPEDANCE VALUE: 512 Ohm
MDC IDC MSMT LEADCHNL RV PACING THRESHOLD AMPLITUDE: 0.75 V
MDC IDC MSMT LEADCHNL RV PACING THRESHOLD PULSEWIDTH: 0.4 ms
MDC IDC MSMT LEADCHNL RV SENSING INTR AMPL: 11.2 mV
MDC IDC SESS DTM: 20150223164554
MDC IDC SET LEADCHNL RV PACING PULSEWIDTH: 0.4 ms

## 2013-11-08 NOTE — Patient Instructions (Addendum)
Remote monitoring is used to monitor your Pacemaker or ICD from home. This monitoring reduces the number of office visits required to check your device to one time per year. It allows [place] to keep an eye on the functioning of your device to ensure it is working properly. You are scheduled for a device check from home on 02/09/14. You may send your transmission at any time that day. If you have a wireless device, the transmission will be sent automatically. After your physician reviews your transmission, you will receive a postcard with your next transmission date.  Your physician wants you to follow-up in: 12 months with Andi Hence You will receive a reminder letter in the mail two months in advance. If you don't receive a letter, please call our office to schedule the follow-up appointment.  Your physician has recommended you make the following change in your medication:  1) Stop Amiodarone

## 2013-11-08 NOTE — Progress Notes (Signed)
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 female who presents today for routine electrophysiology followup.  She has afib and tachycardia/ bradycardia syndrome.  She underwent initial PPM implant in 2008 by Dr Leonia Reeves and generator replacement by me in 2013.   She has done reasonably well since that time.  She has stable chronic SOB and her energy is depressed.  She is functionally very limited.  Today, she denies symptoms of palpitations, chest pain,  lower extremity edema, dizziness, presyncope, or syncope.  The patient is otherwise without complaint today.   Past Medical History  Diagnosis Date  . Lung cancer 2007    hx stage 1 BAC/ RML. s/p wedge resection from RML, superior segment RLL resection s/p seed implants  . Mycobacterial disease   . Permanent atrial fibrillation   . Bronchiectasis     bronch 10/2009 w/ stenotrophomonas sens to levaquin and bactrim  . Tachycardia-bradycardia syndrome 2008    sp PPM by Dr Leonia Reeves  . Osteoporosis   . Hypertension   . Hyperlipidemia   . Glaucoma   . Anxiety   . Sciatica   . Colonic diverticular abscess 01/26/2013  . SSS (sick sinus syndrome)   . Esophageal reflux   . Encounter for long-term (current) use of other medications   . Multinodular goiter (nontoxic)   . Thyrotoxicosis without mention of goiter or other cause, without mention of thyrotoxic crisis or storm   . Hyperthyroidism   . Glaucoma   . H/O hematuria     Neg workup Dahlstedt  . Adrenal adenoma     Neg endocrine workup  . Dry eyes   . History of diverticular abscess 2014  . Chronic headaches   . Diverticulitis 01/2013    Severe. Was hospitalized for this and she developed 100% a fib.   Past Surgical History  Procedure Laterality Date  . Wedge resection  03/2006    For lung cancer. Right middle lobe and right lower lobe superior segment with seed implants, [place].  . Pacemaker insertion  01/2007    (MDT) Pacemaker  syndrome 04/2009, new PM 05/2012 - Marquisha Nikolov  . Laminectomy  11/1986  . Hernia repair  1988  . Bunionectomy  1982  . Cataract extraction, bilateral  04/2004 & 07/2004    Current Outpatient Prescriptions  Medication Sig Dispense Refill  . alendronate (FOSAMAX) 70 MG tablet Take 70 mg by mouth once a week. Take with a full glass of water on an empty stomach.      Marland Kitchen apixaban (ELIQUIS) 2.5 MG TABS tablet Take 2.5 mg by mouth 2 (two) times daily.      . budesonide-formoterol (SYMBICORT) 160-4.5 MCG/ACT inhaler Inhale 2 puffs into the lungs 2 (two) times daily as needed (for shortness of breath).  3 Inhaler  1  . cycloSPORINE (RESTASIS) 0.05 % ophthalmic emulsion Place 1 drop into both eyes 2 (two) times daily.       Marland Kitchen diltiazem (CARDIZEM CD) 120 MG 24 hr capsule Take 1 capsule (120 mg total) by mouth daily.  30 capsule  11  . DULoxetine (CYMBALTA) 60 MG capsule Take 60 mg by mouth daily.      Marland Kitchen HYDROcodone-acetaminophen (NORCO/VICODIN) 5-325 MG per tablet Take 1 tablet by mouth every 8 (eight) hours as needed for pain.      Marland Kitchen latanoprost (XALATAN) 0.005 % ophthalmic solution Place 1 drop into both eyes at bedtime.       . metoprolol (LOPRESSOR)  50 MG tablet Take 50 mg by mouth 2 (two) times daily.        . Multiple Vitamin (MULTIVITAMIN WITH MINERALS) TABS Take 1 tablet by mouth daily.      . Multiple Vitamins-Minerals (ICAPS AREDS FORMULA PO) Take 1 capsule by mouth daily. MACULAR DEGENERATION SUPPLEMENT      . neomycin-polymyxin b-dexamethasone (MAXITROL) 3.5-10000-0.1 SUSP Apply 1 drop topically as directed. Uses for 5 days every month, applied to eyelids.      Marland Kitchen omega-3 acid ethyl esters (LOVAZA) 1 G capsule Take 1 g by mouth daily.      . Vitamin D, Cholecalciferol, 1000 UNITS TABS Take 1 tablet by mouth daily.       No current facility-administered medications for this visit.    Physical Exam: Filed Vitals:   11/08/13 1042  BP: 142/88  Pulse: 73  Height: 5' (1.524 m)  Weight: 98 lb 3.2 oz  (44.543 kg)    GEN- The patient is well appearing, alert and oriented x 3 today.   Head- normocephalic, atraumatic Eyes-  Sclera clear, conjunctiva pink Ears- hearing intact Oropharynx- clear Lungs- Clear to ausculation bilaterally, normal work of breathing Chest- pacemaker pocket is well healed Heart- Regular rate and rhythm (paced) GI- soft, NT, ND, + BS Extremities- no clubbing, cyanosis, or edema  Pacemaker interrogation- reviewed in detail today,  See PACEART report  Assessment and Plan:  1. Symptomatic bradycardia/ tachy-brady syndrome Normal pacemaker function See Pace Art report Her device reveals that she has been in permanent afib for at least 9 months.  She is on amiodarone chronically but is a poor candidate for this medicine.  I will therefore stop amiodarone as she is in afib all of the time anyway. I will reprogram VVIR at this time. She will follow-up with Dr Marlou Porch next month as scheduled.  Her chads2vasc score is at least 4.  She is appropriately anticoagulated with eliquis  2. htn Stable No change required today  3. Permanent afib As above  carelink Return to see Jerene Pitch in 1 year Follow-up with Dr Marlou Porch as scheduled

## 2013-11-19 ENCOUNTER — Ambulatory Visit (INDEPENDENT_AMBULATORY_CARE_PROVIDER_SITE_OTHER): Payer: Medicare Other | Admitting: Pulmonary Disease

## 2013-11-19 ENCOUNTER — Encounter: Payer: Self-pay | Admitting: Pulmonary Disease

## 2013-11-19 VITALS — BP 108/72 | HR 85 | Temp 97.8°F | Ht 60.0 in | Wt 100.2 lb

## 2013-11-19 DIAGNOSIS — J449 Chronic obstructive pulmonary disease, unspecified: Secondary | ICD-10-CM

## 2013-11-19 DIAGNOSIS — J479 Bronchiectasis, uncomplicated: Secondary | ICD-10-CM

## 2013-11-19 DIAGNOSIS — A310 Pulmonary mycobacterial infection: Secondary | ICD-10-CM | POA: Diagnosis not present

## 2013-11-19 DIAGNOSIS — J4489 Other specified chronic obstructive pulmonary disease: Secondary | ICD-10-CM

## 2013-11-19 NOTE — Progress Notes (Signed)
   Subjective:    Patient ID: [name], female    DOB: Jul 10, 1927, 80 y.o.   MRN: [id number]  HPI Patient comes in today for followup of her known bronchiectasis with MAC colonization, as well as COPD. She has been doing well from a breathing standpoint, with no recent acute exacerbation or worsening of her shortness of breath. She still does have significant dyspnea on exertion in which interferes with her activities of daily living at times. She had to change her Symbicort to Advair because of insurance reasons. The patient has lost weight since the last visit, but her recent CT chest did not show any significant worsening of her bronchiectasis or evidence for recurrence of her lung cancer.   Review of Systems  Constitutional: Negative for fever and unexpected weight change.  HENT: Negative for congestion, dental problem, ear pain, nosebleeds, postnasal drip, rhinorrhea, sinus pressure, sneezing, sore throat and trouble swallowing.   Eyes: Negative for redness and itching.  Respiratory: Positive for shortness of breath and wheezing. Negative for cough and chest tightness.   Cardiovascular: Negative for palpitations and leg swelling.  Gastrointestinal: Negative for nausea and vomiting.  Genitourinary: Negative for dysuria.  Musculoskeletal: Negative for joint swelling.  Skin: Negative for rash.  Neurological: Negative for headaches.  Hematological: Does not bruise/bleed easily.  Psychiatric/Behavioral: Negative for dysphoric mood. The patient is not nervous/anxious.        Objective:   Physical Exam Thin female in no acute distress Nose without purulence or discharge noted Neck without lymphadenopathy or thyromegaly Chest with mildly decreased breath sounds, no crackles or wheezes Cardiac exam with regular rate and rhythm Lower extremities without edema, no cyanosis Alert and oriented, moves all 4 extremities.        Assessment & Plan:

## 2013-11-19 NOTE — Patient Instructions (Addendum)
Continue on your advair 2 puffs am and pm.  Keep mouth rinsed well Will give you a trial of spiriva respimat, 2 inhalations each am.  Let me know if you think this helps, and can call in a prescription.  Stay as active as possible.  followup with me again in 66mos.

## 2013-11-19 NOTE — Assessment & Plan Note (Signed)
The patient continues to have changes by CT chest which are consistent with her known MAC colonization, but she does not have other constitutional symptoms to suggest that her MAC has become more of an issue.  Will continue to monitor her symptoms.

## 2013-11-19 NOTE — Assessment & Plan Note (Signed)
The patient appears to be doing fairly well from a COPD standpoint, but continues to have dyspnea on exertion which is interfering with her quality of life. She is currently on a LABA/ICS, and will add an anticholinergic as a trial to see if she sees any difference. I have stressed to her the importance of daily exercise.

## 2013-11-30 ENCOUNTER — Ambulatory Visit (INDEPENDENT_AMBULATORY_CARE_PROVIDER_SITE_OTHER): Payer: Medicare Other | Admitting: Cardiology

## 2013-11-30 ENCOUNTER — Encounter: Payer: Self-pay | Admitting: Cardiology

## 2013-11-30 VITALS — BP 140/80 | HR 76 | Ht 60.0 in | Wt 97.0 lb

## 2013-11-30 DIAGNOSIS — Z950 Presence of cardiac pacemaker: Secondary | ICD-10-CM

## 2013-11-30 DIAGNOSIS — I10 Essential (primary) hypertension: Secondary | ICD-10-CM

## 2013-11-30 DIAGNOSIS — I4891 Unspecified atrial fibrillation: Secondary | ICD-10-CM

## 2013-11-30 NOTE — Patient Instructions (Signed)
Your physician recommends that you continue on your current medications as directed. Please refer to the Current Medication list given to you today.  Your physician wants you to follow-up in: 6 months with Dr. Marlou Porch. You will receive a reminder letter in the mail two months in advance. If you don't receive a letter, please call our office to schedule the follow-up appointment.

## 2013-11-30 NOTE — Progress Notes (Signed)
[facility]. [address], [address], [place]  [address] Phone: [phone] Fax:  [phone]  Date:  11/30/2013   ID:  [name], DOB [birth year], MRN [id number]  PCP:  Irven Shelling, MD  Electrophysiologist: Dr. Rayann Heman  History of Present Illness: [name] is a 80 y.o. female with atrial fibrillation, previously paroxysmal on amiodarone, pacemaker for sick sinus syndrome here for followup. Recently, pacemaker check demonstrated 100% atrial fibrillation.  Amiodarone was discontinued because of her permanent atrial fibrillation. Approximately 2 years ago, pacemaker check demonstrated only 20% atrial fibrillation. Her main complaint continues to be fatigued.  Wt Readings from Last 3 Encounters:  11/30/13 97 lb (43.999 kg)  11/19/13 100 lb 3.2 oz (45.45 kg)  11/08/13 98 lb 3.2 oz (44.543 kg)     Past Medical History  Diagnosis Date  . Lung cancer 2007    hx stage 1 BAC/ RML. s/p wedge resection from RML, superior segment RLL resection s/p seed implants  . Mycobacterial disease   . Permanent atrial fibrillation   . Bronchiectasis     bronch 10/2009 w/ stenotrophomonas sens to levaquin and bactrim  . Tachycardia-bradycardia syndrome 2008    sp PPM by Dr Leonia Reeves  . Osteoporosis   . Hypertension   . Hyperlipidemia   . Glaucoma   . Anxiety   . Sciatica   . Colonic diverticular abscess 01/26/2013  . SSS (sick sinus syndrome)   . Esophageal reflux   . Encounter for long-term (current) use of other medications   . Multinodular goiter (nontoxic)   . Thyrotoxicosis without mention of goiter or other cause, without mention of thyrotoxic crisis or storm   . Hyperthyroidism   . Glaucoma   . H/O hematuria     Neg workup Dahlstedt  . Adrenal adenoma     Neg endocrine workup  . Dry eyes   . History of diverticular abscess 2014  . Chronic headaches   . Diverticulitis 01/2013    Severe. Was hospitalized for this and she developed 100% a fib.    Past  Surgical History  Procedure Laterality Date  . Wedge resection  03/2006    For lung cancer. Right middle lobe and right lower lobe superior segment with seed implants, [facility].  . Pacemaker insertion  01/2007    (MDT) Pacemaker syndrome 04/2009, new PM 05/2012 - [facility]  . Laminectomy  11/1986  . Hernia repair  1988  . Bunionectomy  1982  . Cataract extraction, bilateral  04/2004 & 07/2004    Current Outpatient Prescriptions  Medication Sig Dispense Refill  . albuterol (PROAIR HFA) 108 (90 BASE) MCG/ACT inhaler Inhale 1 puff into the lungs every 6 (six) hours as needed for wheezing or shortness of breath.      Marland Kitchen alendronate (FOSAMAX) 70 MG tablet Take 70 mg by mouth once a week. Take with a full glass of water on an empty stomach.      Marland Kitchen apixaban (ELIQUIS) 2.5 MG TABS tablet Take 2.5 mg by mouth 2 (two) times daily.      . cycloSPORINE (RESTASIS) 0.05 % ophthalmic emulsion Place 1 drop into both eyes 2 (two) times daily.       Marland Kitchen diltiazem (CARDIZEM CD) 120 MG 24 hr capsule Take 1 capsule (120 mg total) by mouth daily.  30 capsule  11  . DULoxetine (CYMBALTA) 60 MG capsule Take 60 mg by mouth daily.      . fluticasone-salmeterol (ADVAIR HFA) 115-21 MCG/ACT inhaler  Inhale 2 puffs into the lungs 2 (two) times daily.      Marland Kitchen HYDROcodone-acetaminophen (NORCO/VICODIN) 5-325 MG per tablet Take 1 tablet by mouth every 8 (eight) hours as needed for pain.      Marland Kitchen latanoprost (XALATAN) 0.005 % ophthalmic solution Place 1 drop into both eyes at bedtime.       . metoprolol (LOPRESSOR) 50 MG tablet Take 50 mg by mouth 2 (two) times daily.        . Multiple Vitamin (MULTIVITAMIN WITH MINERALS) TABS Take 1 tablet by mouth daily.      . Multiple Vitamins-Minerals (ICAPS AREDS FORMULA PO) Take 1 capsule by mouth daily. MACULAR DEGENERATION SUPPLEMENT      . Olopatadine HCl (PATADAY OP) Apply to eye as directed.      . Vitamin D, Cholecalciferol, 1000 UNITS TABS Take 1 tablet by mouth daily.      Marland Kitchen zolpidem (AMBIEN)  10 MG tablet Take 10 mg by mouth at bedtime as needed for sleep.       No current facility-administered medications for this visit.    Allergies:   No Known Allergies  Social History:  The patient  reports that she quit smoking about 9 years ago. Her smoking use included Cigarettes. She has a 27.5 pack-year smoking history. She has never used smokeless tobacco. She reports that she does not drink alcohol or use illicit drugs.   ROS:  Please see the history of present illness.   Denies any syncope, bleeding, orthopnea, PND, strokelike symptoms.    PHYSICAL EXAM: VS:  BP 140/80  Pulse 76  Wt 97 lb (43.999 kg) Thin, in no acute distress HEENT: normal Neck: no JVD Cardiac:  normal S1, S2; RRR; no murmur Lungs:  Right lower lobe wheezing,mild rhonchi no rales Abd: soft, nontender, no hepatomegaly Ext: no edema Skin: warm and dry Neuro: no focal abnormalities noted  EKG:  Ventricular pacing. Permanent atrial fibrillation. Heart rate 76.    Labs: 2/15-creatinine 1.45, hemoglobin 15  ASSESSMENT AND PLAN:  1. Permanent atrial fibrillation-off of amiodarone. Excellent. She does feel better. No changes made. Rate controlled with diltiazem. 2. Pacemaker-functioning well. Dr. Rayann Heman. 3. Hypertension-currently well controlled. No changes made. 4. Chronic anticoagulation-lab work reviewed. Eliquis 2.5 mg twice a day. 5. 6 month followup  Signed, Candee Furbish, MD [facility]  11/30/2013 11:05 AM

## 2014-01-24 DIAGNOSIS — H04129 Dry eye syndrome of unspecified lacrimal gland: Secondary | ICD-10-CM | POA: Diagnosis not present

## 2014-01-24 DIAGNOSIS — H409 Unspecified glaucoma: Secondary | ICD-10-CM | POA: Diagnosis not present

## 2014-01-24 DIAGNOSIS — H4011X0 Primary open-angle glaucoma, stage unspecified: Secondary | ICD-10-CM | POA: Diagnosis not present

## 2014-02-09 ENCOUNTER — Encounter: Payer: Self-pay | Admitting: Internal Medicine

## 2014-02-09 ENCOUNTER — Ambulatory Visit (INDEPENDENT_AMBULATORY_CARE_PROVIDER_SITE_OTHER): Payer: Medicare Other | Admitting: *Deleted

## 2014-02-09 DIAGNOSIS — I495 Sick sinus syndrome: Secondary | ICD-10-CM

## 2014-02-09 DIAGNOSIS — I4891 Unspecified atrial fibrillation: Secondary | ICD-10-CM | POA: Diagnosis not present

## 2014-02-09 NOTE — Progress Notes (Signed)
Remote pacemaker transmission.   

## 2014-02-22 LAB — MDC_IDC_ENUM_SESS_TYPE_REMOTE
Battery Remaining Longevity: 170 mo
Brady Statistic RV Percent Paced: 60 %
Date Time Interrogation Session: 20150527135858
Lead Channel Impedance Value: 531 Ohm
Lead Channel Impedance Value: 67 Ohm
Lead Channel Pacing Threshold Amplitude: 0.875 V
Lead Channel Sensing Intrinsic Amplitude: 22.4 mV
Lead Channel Setting Pacing Amplitude: 2 V
Lead Channel Setting Pacing Pulse Width: 0.4 ms
MDC IDC MSMT BATTERY IMPEDANCE: 110 Ohm
MDC IDC MSMT BATTERY VOLTAGE: 2.79 V
MDC IDC MSMT LEADCHNL RV PACING THRESHOLD PULSEWIDTH: 0.4 ms
MDC IDC SET LEADCHNL RV SENSING SENSITIVITY: 4 mV

## 2014-02-25 ENCOUNTER — Encounter: Payer: Self-pay | Admitting: Cardiology

## 2014-03-25 ENCOUNTER — Encounter: Payer: Self-pay | Admitting: *Deleted

## 2014-05-12 DIAGNOSIS — J479 Bronchiectasis, uncomplicated: Secondary | ICD-10-CM | POA: Diagnosis not present

## 2014-05-12 DIAGNOSIS — G8929 Other chronic pain: Secondary | ICD-10-CM | POA: Diagnosis not present

## 2014-05-12 DIAGNOSIS — I10 Essential (primary) hypertension: Secondary | ICD-10-CM | POA: Diagnosis not present

## 2014-05-12 DIAGNOSIS — E46 Unspecified protein-calorie malnutrition: Secondary | ICD-10-CM | POA: Diagnosis not present

## 2014-05-12 DIAGNOSIS — K921 Melena: Secondary | ICD-10-CM | POA: Diagnosis not present

## 2014-05-17 ENCOUNTER — Encounter: Payer: Self-pay | Admitting: Internal Medicine

## 2014-05-17 ENCOUNTER — Telehealth: Payer: Self-pay | Admitting: Cardiology

## 2014-05-17 ENCOUNTER — Ambulatory Visit (INDEPENDENT_AMBULATORY_CARE_PROVIDER_SITE_OTHER): Payer: Medicare Other | Admitting: *Deleted

## 2014-05-17 DIAGNOSIS — I495 Sick sinus syndrome: Secondary | ICD-10-CM

## 2014-05-17 NOTE — Telephone Encounter (Signed)
LMOVM reminding pt to send remote transmission.   

## 2014-05-17 NOTE — Progress Notes (Signed)
Remote pacemaker transmission.   

## 2014-05-18 LAB — MDC_IDC_ENUM_SESS_TYPE_REMOTE
Battery Remaining Longevity: 159 mo
Brady Statistic RV Percent Paced: 50 %
Lead Channel Impedance Value: 533 Ohm
Lead Channel Impedance Value: 67 Ohm
Lead Channel Pacing Threshold Amplitude: 1.125 V
Lead Channel Sensing Intrinsic Amplitude: 11.2 mV
Lead Channel Setting Pacing Pulse Width: 0.46 ms
Lead Channel Setting Sensing Sensitivity: 4 mV
MDC IDC MSMT BATTERY IMPEDANCE: 134 Ohm
MDC IDC MSMT BATTERY VOLTAGE: 2.79 V
MDC IDC MSMT LEADCHNL RV PACING THRESHOLD PULSEWIDTH: 0.4 ms
MDC IDC SESS DTM: 20150901184811
MDC IDC SET LEADCHNL RV PACING AMPLITUDE: 2.25 V

## 2014-05-25 ENCOUNTER — Encounter: Payer: Self-pay | Admitting: Cardiology

## 2014-05-25 ENCOUNTER — Encounter: Payer: Self-pay | Admitting: Pulmonary Disease

## 2014-05-25 ENCOUNTER — Ambulatory Visit (INDEPENDENT_AMBULATORY_CARE_PROVIDER_SITE_OTHER): Payer: Medicare Other | Admitting: Pulmonary Disease

## 2014-05-25 VITALS — BP 110/62 | HR 83 | Temp 98.0°F | Ht 60.0 in | Wt 93.6 lb

## 2014-05-25 DIAGNOSIS — A310 Pulmonary mycobacterial infection: Secondary | ICD-10-CM

## 2014-05-25 DIAGNOSIS — J449 Chronic obstructive pulmonary disease, unspecified: Secondary | ICD-10-CM

## 2014-05-25 DIAGNOSIS — Z23 Encounter for immunization: Secondary | ICD-10-CM

## 2014-05-25 DIAGNOSIS — J479 Bronchiectasis, uncomplicated: Secondary | ICD-10-CM | POA: Diagnosis not present

## 2014-05-25 NOTE — Progress Notes (Signed)
   Subjective:    Patient ID: [name], female    DOB: July 02, 1927, 80 y.o.   MRN: [id number]  HPI Patient comes in today for followup of her known bronchiectasis with underlying obstructive lung disease and MAC colonization. She has done very well since the last visit, with no acute exacerbation nor increased cough and congestion. She will occasionally bring up scant quantities of discolored mucus and occasionally with blood streaks. I have told her this is not unusual given her history of bronchiectasis. She has been staying as active as she can, and does not feel that her exertional tolerance has worsened since last visit. She was tried on Spiriva at the last visit and did not see a significant change. She is currently staying on her Advair.   Review of Systems  Constitutional: Negative for fever and unexpected weight change.  HENT: Negative for congestion, dental problem, ear pain, nosebleeds, postnasal drip, rhinorrhea, sinus pressure, sneezing, sore throat and trouble swallowing.   Eyes: Negative for redness and itching.  Respiratory: Positive for cough, chest tightness, shortness of breath and wheezing. Negative for apnea.   Cardiovascular: Negative for palpitations and leg swelling.  Gastrointestinal: Negative for nausea and vomiting.  Genitourinary: Negative for dysuria.  Musculoskeletal: Negative for joint swelling.  Skin: Negative for rash.  Neurological: Negative for headaches.  Hematological: Does not bruise/bleed easily.  Psychiatric/Behavioral: Negative for dysphoric mood. The patient is not nervous/anxious.        Objective:   Physical Exam Thin female in no acute distress Nose without purulence or discharge noted Neck without lymphadenopathy or thyromegaly Chest with a few scattered crackles, no wheezing and adequate airflow Cardiac exam with regular rate and rhythm Lower extremities, no cyanosis, varicosities noted Alert and oriented, moves all 4  extremities.      Assessment & Plan:

## 2014-05-25 NOTE — Patient Instructions (Signed)
No change in your current breathing medications.  Will send prescription for your meds Try and stay as active as possible Will give you a flu shot today followup with me again in 69mos.

## 2014-05-25 NOTE — Assessment & Plan Note (Signed)
The patient has known significant bronchiectasis, but overall is doing very well. She has had no significant increase in cough or congestion, and has not had an acute exacerbation in quite some time.

## 2014-05-25 NOTE — Assessment & Plan Note (Signed)
The patient has chronic dyspnea on exertion secondary to her moderate to severe airflow obstruction related to her bronchiectasis. She is maintained on her Advair, but did not see a difference with the addition of a long-acting anticholinergic. I've asked her to try and stay as active as possible, and to maintain her conditioning.

## 2014-06-03 ENCOUNTER — Other Ambulatory Visit: Payer: Self-pay | Admitting: *Deleted

## 2014-06-03 MED ORDER — METOPROLOL TARTRATE 50 MG PO TABS: 50.0000 mg | ORAL_TABLET | Freq: Two times a day (BID) | ORAL | Status: DC

## 2014-06-23 DIAGNOSIS — H3531 Nonexudative age-related macular degeneration: Secondary | ICD-10-CM | POA: Diagnosis not present

## 2014-06-23 DIAGNOSIS — H4011X0 Primary open-angle glaucoma, stage unspecified: Secondary | ICD-10-CM | POA: Diagnosis not present

## 2014-06-23 DIAGNOSIS — H409 Unspecified glaucoma: Secondary | ICD-10-CM | POA: Diagnosis not present

## 2014-06-23 DIAGNOSIS — H04123 Dry eye syndrome of bilateral lacrimal glands: Secondary | ICD-10-CM | POA: Diagnosis not present

## 2014-07-22 ENCOUNTER — Encounter: Payer: Self-pay | Admitting: *Deleted

## 2014-07-22 ENCOUNTER — Ambulatory Visit (INDEPENDENT_AMBULATORY_CARE_PROVIDER_SITE_OTHER): Payer: Medicare Other | Admitting: Cardiology

## 2014-07-22 VITALS — BP 122/78 | HR 81 | Ht 60.0 in | Wt 91.0 lb

## 2014-07-22 DIAGNOSIS — Z950 Presence of cardiac pacemaker: Secondary | ICD-10-CM

## 2014-07-22 DIAGNOSIS — I481 Persistent atrial fibrillation: Secondary | ICD-10-CM

## 2014-07-22 DIAGNOSIS — E43 Unspecified severe protein-calorie malnutrition: Secondary | ICD-10-CM

## 2014-07-22 DIAGNOSIS — I4819 Other persistent atrial fibrillation: Secondary | ICD-10-CM

## 2014-07-22 DIAGNOSIS — I495 Sick sinus syndrome: Secondary | ICD-10-CM

## 2014-07-22 NOTE — Progress Notes (Signed)
[facility]. [address], [place]  [address] Phone: [phone] Fax:  [phone]  Date:  07/22/2014   ID:  [name], DOB 1927/02/18, MRN [id number]  PCP:  Irven Shelling, MD  Electrophysiologist: Dr. Rayann Heman  History of Present Illness: [name] is a 80 y.o. female with atrial fibrillation, previously paroxysmal on amiodarone, pacemaker for sick sinus syndrome here for followup. Recently, pacemaker check demonstrated 100% atrial fibrillation.  Amiodarone was discontinued because of her permanent atrial fibrillation. Approximately 2 years ago, pacemaker check demonstrated only 20% atrial fibrillation.   She is doing well. She continues to lose weight. Not much appetite. Occasional ice cream cone. No bleeding on Eliquis.  Wt Readings from Last 3 Encounters:  07/22/14 91 lb (41.277 kg)  05/25/14 93 lb 9.6 oz (42.457 kg)  11/30/13 97 lb (43.999 kg)     Past Medical History  Diagnosis Date  . Lung cancer 2007    hx stage 1 BAC/ RML. s/p wedge resection from RML, superior segment RLL resection s/p seed implants  . Mycobacterial disease   . Permanent atrial fibrillation   . Bronchiectasis     bronch 10/2009 w/ stenotrophomonas sens to levaquin and bactrim  . Tachycardia-bradycardia syndrome 2008    sp PPM by Dr Leonia Reeves  . Osteoporosis   . Hypertension   . Hyperlipidemia   . Glaucoma   . Anxiety   . Sciatica   . Colonic diverticular abscess 01/26/2013  . SSS (sick sinus syndrome)   . Esophageal reflux   . Encounter for long-term (current) use of other medications   . Multinodular goiter (nontoxic)   . Thyrotoxicosis without mention of goiter or other cause, without mention of thyrotoxic crisis or storm   . Hyperthyroidism   . Glaucoma   . H/O hematuria     Neg workup Dahlstedt  . Adrenal adenoma     Neg endocrine workup  . Dry eyes   . History of diverticular abscess 2014  . Chronic headaches   . Diverticulitis 01/2013   Severe. Was hospitalized for this and she developed 100% a fib.    Past Surgical History  Procedure Laterality Date  . Wedge resection  03/2006    For lung cancer. Right middle lobe and right lower lobe superior segment with seed implants, [facility]ney.  . Pacemaker insertion  01/2007    (MDT) Pacemaker syndrome 04/2009, new PM 05/2012 - [facility]  . Laminectomy  11/1986  . Hernia repair  1988  . Bunionectomy  1982  . Cataract extraction, bilateral  04/2004 & 07/2004    Current Outpatient Prescriptions  Medication Sig Dispense Refill  . albuterol (PROAIR HFA) 108 (90 BASE) MCG/ACT inhaler Inhale 1 puff into the lungs every 6 (six) hours as needed for wheezing or shortness of breath.    Marland Kitchen alendronate (FOSAMAX) 70 MG tablet Take 70 mg by mouth once a week. Take with a full glass of water on an empty stomach.    Marland Kitchen apixaban (ELIQUIS) 2.5 MG TABS tablet Take 2.5 mg by mouth 2 (two) times daily.    . cycloSPORINE (RESTASIS) 0.05 % ophthalmic emulsion Place 1 drop into both eyes 2 (two) times daily.     Marland Kitchen diltiazem (CARDIZEM CD) 120 MG 24 hr capsule Take 1 capsule (120 mg total) by mouth daily. 30 capsule 11  . DULoxetine (CYMBALTA) 60 MG capsule Take 60 mg by mouth daily.    . fluticasone-salmeterol (ADVAIR HFA) 115-21 MCG/ACT inhaler Inhale  2 puffs into the lungs 2 (two) times daily.    Marland Kitchen HYDROcodone-acetaminophen (NORCO/VICODIN) 5-325 MG per tablet Take 1 tablet by mouth every 8 (eight) hours as needed for pain.    Marland Kitchen latanoprost (XALATAN) 0.005 % ophthalmic solution Place 1 drop into both eyes at bedtime.     . metoprolol (LOPRESSOR) 50 MG tablet Take 1 tablet (50 mg total) by mouth 2 (two) times daily. 180 tablet 0  . Multiple Vitamin (MULTIVITAMIN WITH MINERALS) TABS Take 1 tablet by mouth daily.    . Multiple Vitamins-Minerals (ICAPS AREDS FORMULA PO) Take 1 capsule by mouth daily. MACULAR DEGENERATION SUPPLEMENT    . Olopatadine HCl (PATADAY OP) Apply to eye as directed.    Marland Kitchen omeprazole (PRILOSEC) 40  MG capsule Take 40 mg by mouth daily.    . Vitamin D, Cholecalciferol, 1000 UNITS TABS Take 1 tablet by mouth daily.    Marland Kitchen zolpidem (AMBIEN) 10 MG tablet Take 10 mg by mouth at bedtime as needed for sleep.     No current facility-administered medications for this visit.    Allergies:   No Known Allergies  Social History:  The patient  reports that she quit smoking about 10 years ago. Her smoking use included Cigarettes. She has a 27.5 pack-year smoking history. She has never used smokeless tobacco. She reports that she does not drink alcohol or use illicit drugs.   ROS:  Please see the history of present illness.   Denies any syncope, bleeding, orthopnea, PND, strokelike symptoms.    PHYSICAL EXAM: VS:  BP 122/78 mmHg  Pulse 81  Ht 5' (1.524 m)  Wt 91 lb (41.277 kg)  BMI 17.77 kg/m2 Thin, in no acute distress HEENT: normaleye redness Neck: no JVD Cardiac:  irreg irreg no murmur Lungs:  Right lower lobe wheezing,mild rhonchi no rales Abd: soft, nontender, no hepatomegaly Ext: no edema Skin: warm and dryminor bruises Neuro: no focal abnormalities noted  EKG:  Ventricular pacing. Permanent atrial fibrillation. Heart rate 76.    Labs: 2/15-creatinine 1.45, hemoglobin 15  ASSESSMENT AND PLAN:  1. Permanent atrial fibrillation-off of amiodarone. Excellent. She does feel better. No changes made. Rate controlled with diltiazem. 2. Pacemaker-functioning well. Dr. Rayann Heman. 2/16 eval.  3. Hypertension-currently well controlled. No changes made. 4. Chronic anticoagulation-lab work reviewed. Eliquis 2.5 mg twice a day. (Age, weight)Dr. Laurann Montana labs 8/15. We need to continue to monitor this every 6 months 5. Protein calorie malnutrition-encourage protein, shakes. Expressed increased risk with decreased BMI. 6. 6 month followup  Signed, Candee Furbish, MD [facility]  07/22/2014 1:41 PM

## 2014-07-22 NOTE — Patient Instructions (Signed)
The current medical regimen is effective;  continue present plan and medications.  Follow up in 6 months with Dr. Marlou Porch.  You will receive a letter in the mail 2 months before you are due.  Please call us when you receive this letter to schedule your follow up appointment.

## 2014-08-18 ENCOUNTER — Ambulatory Visit (INDEPENDENT_AMBULATORY_CARE_PROVIDER_SITE_OTHER): Payer: Medicare Other | Admitting: *Deleted

## 2014-08-18 ENCOUNTER — Encounter: Payer: Self-pay | Admitting: Internal Medicine

## 2014-08-18 DIAGNOSIS — I495 Sick sinus syndrome: Secondary | ICD-10-CM | POA: Diagnosis not present

## 2014-08-18 LAB — MDC_IDC_ENUM_SESS_TYPE_REMOTE
Date Time Interrogation Session: 20151203222617
Lead Channel Impedance Value: 489 Ohm
Lead Channel Impedance Value: 67 Ohm
Lead Channel Pacing Threshold Amplitude: 0.875 V
Lead Channel Pacing Threshold Pulse Width: 0.4 ms
Lead Channel Sensing Intrinsic Amplitude: 11.2 mV
Lead Channel Setting Sensing Sensitivity: 4 mV
MDC IDC MSMT BATTERY IMPEDANCE: 134 Ohm
MDC IDC MSMT BATTERY REMAINING LONGEVITY: 164 mo
MDC IDC MSMT BATTERY VOLTAGE: 2.79 V
MDC IDC SET LEADCHNL RV PACING AMPLITUDE: 2 V
MDC IDC SET LEADCHNL RV PACING PULSEWIDTH: 0.4 ms
MDC IDC STAT BRADY RV PERCENT PACED: 46 %

## 2014-08-19 NOTE — Progress Notes (Signed)
Remote pacemaker transmission.   

## 2014-08-23 ENCOUNTER — Other Ambulatory Visit: Payer: Self-pay | Admitting: Cardiology

## 2014-08-25 ENCOUNTER — Encounter (HOSPITAL_COMMUNITY): Payer: Self-pay | Admitting: Internal Medicine

## 2014-09-06 ENCOUNTER — Telehealth: Payer: Self-pay | Admitting: *Deleted

## 2014-09-06 DIAGNOSIS — I495 Sick sinus syndrome: Secondary | ICD-10-CM

## 2014-09-06 MED ORDER — DILTIAZEM HCL ER COATED BEADS 120 MG PO CP24: 240.0000 mg | ORAL_CAPSULE | Freq: Every day | ORAL | Status: DC

## 2014-09-06 NOTE — Telephone Encounter (Signed)
Spoke with patient.  [facility] transmission from 08/18/14 reviewed.  Per Dr. Rayann Heman she is to increase her Diltiazem from 120mg ->240mg  daily.  Patient is in agreement.  Order faxed to [facility].

## 2014-11-02 ENCOUNTER — Telehealth: Payer: Self-pay | Admitting: Internal Medicine

## 2014-11-02 NOTE — Telephone Encounter (Signed)
[name] will call and schedule

## 2014-11-02 NOTE — Telephone Encounter (Signed)
New Message  Pt calling to make recall appts for Skains and Allred (actually an edmiston recall). Pt wanted same day appts and made them for May (5/16). The recall for the phys check w/ Allred was for February and wanted to speak w/ Rn to see if waiting until May was appropriate. Please call back and discuss.

## 2014-11-07 DIAGNOSIS — R7301 Impaired fasting glucose: Secondary | ICD-10-CM | POA: Diagnosis not present

## 2014-11-07 DIAGNOSIS — Z1389 Encounter for screening for other disorder: Secondary | ICD-10-CM | POA: Diagnosis not present

## 2014-11-07 DIAGNOSIS — E042 Nontoxic multinodular goiter: Secondary | ICD-10-CM | POA: Diagnosis not present

## 2014-11-07 DIAGNOSIS — I10 Essential (primary) hypertension: Secondary | ICD-10-CM | POA: Diagnosis not present

## 2014-11-07 DIAGNOSIS — Z0000 Encounter for general adult medical examination without abnormal findings: Secondary | ICD-10-CM | POA: Diagnosis not present

## 2014-11-07 DIAGNOSIS — F419 Anxiety disorder, unspecified: Secondary | ICD-10-CM | POA: Diagnosis not present

## 2014-11-23 ENCOUNTER — Ambulatory Visit (INDEPENDENT_AMBULATORY_CARE_PROVIDER_SITE_OTHER): Payer: Medicare Other | Admitting: Pulmonary Disease

## 2014-11-23 ENCOUNTER — Encounter: Payer: Self-pay | Admitting: Pulmonary Disease

## 2014-11-23 VITALS — BP 130/76 | HR 60 | Temp 97.0°F | Ht 60.0 in | Wt 93.8 lb

## 2014-11-23 DIAGNOSIS — J479 Bronchiectasis, uncomplicated: Secondary | ICD-10-CM

## 2014-11-23 DIAGNOSIS — J449 Chronic obstructive pulmonary disease, unspecified: Secondary | ICD-10-CM

## 2014-11-23 MED ORDER — ALBUTEROL SULFATE HFA 108 (90 BASE) MCG/ACT IN AERS: 2.0000 | INHALATION_SPRAY | Freq: Four times a day (QID) | RESPIRATORY_TRACT | Status: DC | PRN

## 2014-11-23 NOTE — Assessment & Plan Note (Signed)
The patient has been doing very well from a bronchiectasis standpoint. She still has daily discolored mucus, but I have told her this is very common with her diagnosis. She has not had an acute exacerbation in a very long time.

## 2014-11-23 NOTE — Patient Instructions (Signed)
If you are not having breathing issues, and felt the advair did not make a difference, then I would stay off of the medication You do need to keep a rescue inhaler handy:  proair 2 inhalations up to every 6hrs if needed for rescue followup with me again in 16mos, but call if having increased breathing issues.

## 2014-11-23 NOTE — Progress Notes (Signed)
   Subjective:    Patient ID: [name], female    DOB: April 12, 1927, 81 y.o.   MRN: [id number]  HPI The patient comes in today for follow-up of her known bronchiectasis with underlying COPD. His discontinued her maintenance bronchodilator regimen, and has seen no change in her breathing since being off the medication. She has not had an acute exacerbation of her COPD or bronchiectasis, and has not required a rescue medication. She feels that her exertional tolerance is at baseline. Even though she is coughing up a small quantity of purulent mucus daily, her quantity and character of her mucus has not changed.   Review of Systems  Constitutional: Negative for fever and unexpected weight change.  HENT: Positive for congestion and postnasal drip. Negative for dental problem, ear pain, nosebleeds, rhinorrhea, sinus pressure, sneezing, sore throat and trouble swallowing.   Eyes: Negative for redness and itching.  Respiratory: Positive for shortness of breath and wheezing. Negative for cough and chest tightness.   Cardiovascular: Negative for palpitations and leg swelling.  Gastrointestinal: Negative for nausea and vomiting.  Genitourinary: Negative for dysuria.  Musculoskeletal: Negative for joint swelling.  Skin: Negative for rash.  Neurological: Negative for headaches.  Hematological: Does not bruise/bleed easily.  Psychiatric/Behavioral: Negative for dysphoric mood. The patient is not nervous/anxious.        Objective:   Physical Exam  Thin female in no acute distress Nose without purulence or discharge noted Neck without lymphadenopathy or thyromegaly Chest with a few scattered crackles, but excellent air flow and no wheezing Cardiac exam with regular rate and rhythm Lower extremities without edema, no cyanosis Alert and oriented, moves all 4 extremities.      Assessment & Plan:

## 2014-11-23 NOTE — Assessment & Plan Note (Signed)
The patient has very significant COPD, but quit using her Advair because she felt it did not make any difference to her breathing. Since being off the medication, she has seen no change. The only reason to keep her on maintenance bronchodilators is to prevent acute exacerbations, and also to help with symptoms. Since the patient is not having either one of these, I have told her it is okay to stay off maintenance meds. However, she will need to stay on a rescue inhaler in the event she develops increased symptoms.

## 2015-01-30 ENCOUNTER — Ambulatory Visit (INDEPENDENT_AMBULATORY_CARE_PROVIDER_SITE_OTHER): Payer: Medicare Other | Admitting: Internal Medicine

## 2015-01-30 ENCOUNTER — Encounter: Payer: Self-pay | Admitting: Cardiology

## 2015-01-30 ENCOUNTER — Encounter: Payer: Self-pay | Admitting: Internal Medicine

## 2015-01-30 ENCOUNTER — Ambulatory Visit (INDEPENDENT_AMBULATORY_CARE_PROVIDER_SITE_OTHER): Payer: Medicare Other | Admitting: Cardiology

## 2015-01-30 VITALS — BP 132/84 | HR 68 | Ht 60.0 in | Wt 94.8 lb

## 2015-01-30 VITALS — BP 132/84 | HR 68 | Ht 60.0 in | Wt 94.1 lb

## 2015-01-30 DIAGNOSIS — E785 Hyperlipidemia, unspecified: Secondary | ICD-10-CM | POA: Diagnosis not present

## 2015-01-30 DIAGNOSIS — I10 Essential (primary) hypertension: Secondary | ICD-10-CM | POA: Diagnosis not present

## 2015-01-30 DIAGNOSIS — I481 Persistent atrial fibrillation: Secondary | ICD-10-CM | POA: Diagnosis not present

## 2015-01-30 DIAGNOSIS — I4819 Other persistent atrial fibrillation: Secondary | ICD-10-CM

## 2015-01-30 DIAGNOSIS — Z950 Presence of cardiac pacemaker: Secondary | ICD-10-CM | POA: Diagnosis not present

## 2015-01-30 DIAGNOSIS — I495 Sick sinus syndrome: Secondary | ICD-10-CM

## 2015-01-30 LAB — CUP PACEART INCLINIC DEVICE CHECK
Battery Voltage: 2.79 V
Lead Channel Impedance Value: 470 Ohm
Lead Channel Impedance Value: 67 Ohm
Lead Channel Pacing Threshold Amplitude: 1 V
Lead Channel Pacing Threshold Pulse Width: 0.4 ms
Lead Channel Sensing Intrinsic Amplitude: 11.2 mV
Lead Channel Setting Pacing Amplitude: 2.5 V
Lead Channel Setting Pacing Pulse Width: 0.4 ms
Lead Channel Setting Sensing Sensitivity: 2.8 mV
MDC IDC MSMT BATTERY IMPEDANCE: 158 Ohm
MDC IDC MSMT BATTERY REMAINING LONGEVITY: 157 mo
MDC IDC SESS DTM: 20160516150204
MDC IDC STAT BRADY RV PERCENT PACED: 50 %

## 2015-01-30 NOTE — Patient Instructions (Signed)
Medication Instructions:  Your physician recommends that you continue on your current medications as directed. Please refer to the Current Medication list given to you today.  Follow-Up: Follow up in 6 months with Dr. Marlou Porch.  You will receive a letter in the mail 2 months before you are due.  Please call us when you receive this letter to schedule your follow up appointment. . Thank you for choosing [facility]!!

## 2015-01-30 NOTE — Progress Notes (Signed)
Electrophysiology Office Note   Date:  01/30/2015   ID:  KIRAH STICE, DOB 1926/10/06, MRN [id number]  PCP:  Irven Shelling, MD  Cardiologist:  Dr Marlou Porch Primary Electrophysiologist: Thompson Grayer, MD    Chief Complaint  Patient presents with  . Atrial Fibrillation     History of Present Illness: [name] is a 81 y.o. female who presents today for electrophysiology evaluation.   Doing well.  Chronic SOB unchanged.  Today, she denies symptoms of palpitations, chest pain, orthopnea, PND, lower extremity edema, claudication, dizziness, presyncope, syncope, bleeding, or neurologic sequela. The patient is tolerating medications without difficulties and is otherwise without complaint today.    Past Medical History  Diagnosis Date  . Lung cancer 2007    hx stage 1 BAC/ RML. s/p wedge resection from RML, superior segment RLL resection s/p seed implants  . Mycobacterial disease   . Permanent atrial fibrillation   . Bronchiectasis     bronch 10/2009 w/ stenotrophomonas sens to levaquin and bactrim  . Tachycardia-bradycardia syndrome 2008    sp PPM by Dr Leonia Reeves  . Osteoporosis   . Hypertension   . Hyperlipidemia   . Glaucoma   . Anxiety   . Sciatica   . Colonic diverticular abscess 01/26/2013  . SSS (sick sinus syndrome)   . Esophageal reflux   . Encounter for long-term (current) use of other medications   . Multinodular goiter (nontoxic)   . Thyrotoxicosis without mention of goiter or other cause, without mention of thyrotoxic crisis or storm   . Hyperthyroidism   . Glaucoma   . H/O hematuria     Neg workup Dahlstedt  . Adrenal adenoma     Neg endocrine workup  . Dry eyes   . History of diverticular abscess 2014  . Chronic headaches   . Diverticulitis 01/2013    Severe. Was hospitalized for this and she developed 100% a fib.   Past Surgical History  Procedure Laterality Date  . Wedge resection  03/2006    For lung cancer. Right middle lobe and right  lower lobe superior segment with seed implants, Burney.  . Pacemaker insertion  01/2007    (MDT) Pacemaker syndrome 04/2009, new PM 05/2012 - Rayme Bui  . Laminectomy  11/1986  . Hernia repair  1988  . Bunionectomy  1982  . Cataract extraction, bilateral  04/2004 & 07/2004  . Permanent pacemaker generator change N/A 05/28/2012    Procedure: PERMANENT PACEMAKER GENERATOR CHANGE;  Surgeon: Thompson Grayer, MD;  Location: [facility] CATH LAB;  Service: Cardiovascular;  Laterality: N/A;     Current Outpatient Prescriptions  Medication Sig Dispense Refill  . albuterol (PROAIR HFA) 108 (90 BASE) MCG/ACT inhaler Inhale 2 puffs into the lungs every 6 (six) hours as needed for wheezing or shortness of breath. 3 Inhaler 0  . alendronate (FOSAMAX) 70 MG tablet Take 70 mg by mouth once a week. Take with a full glass of water on an empty stomach.    Marland Kitchen apixaban (ELIQUIS) 2.5 MG TABS tablet Take 2.5 mg by mouth 2 (two) times daily.    . Cholecalciferol (VITAMIN D3) 1000 UNITS CAPS Take 1 capsule by mouth daily.    . cycloSPORINE (RESTASIS) 0.05 % ophthalmic emulsion Place 1 drop into both eyes 2 (two) times daily.     Marland Kitchen diltiazem (CARDIZEM) 60 MG tablet Take 60 mg by mouth 2 (two) times daily.    . DULoxetine (CYMBALTA) 30 MG capsule Take 30 mg by mouth daily.    Marland Kitchen  HYDROcodone-acetaminophen (NORCO/VICODIN) 5-325 MG per tablet Take 1 tablet by mouth every 8 (eight) hours as needed for pain.    Marland Kitchen latanoprost (XALATAN) 0.005 % ophthalmic solution Place 1 drop into both eyes at bedtime.     . metoprolol (LOPRESSOR) 50 MG tablet TAKE 1 TABLET TWICE A DAY (Patient taking differently: TAKE 1 TABLET BY MOUTH TWICE A DAY) 180 tablet 1  . Multiple Vitamins-Minerals (ICAPS AREDS FORMULA PO) Take 1 capsule by mouth daily. MACULAR DEGENERATION SUPPLEMENT    . Olopatadine HCl (PATADAY OP) Apply to eye as directed.    . Omega-3 Fatty Acids (FISH OIL) 1000 MG CAPS Take 2 capsules by mouth daily.    Marland Kitchen omeprazole (PRILOSEC) 40 MG capsule  Take 40 mg by mouth daily.    Marland Kitchen zolpidem (AMBIEN) 10 MG tablet Take 10 mg by mouth at bedtime as needed for sleep.     No current facility-administered medications for this visit.    Allergies:   Review of patient's allergies indicates no known allergies.   Social History:  The patient  reports that she quit smoking about 11 years ago. Her smoking use included Cigarettes. She has a 27.5 pack-year smoking history. She has never used smokeless tobacco. She reports that she does not drink alcohol or use illicit drugs.   Family History:  The patient's family history includes Arthritis in her mother; CAD in her sister; Cancer in her son; Diabetes in her mother; Heart disease in her father; Heart failure in her father; Hypertension in her sister; Lung cancer in her sister; Ovarian cancer in her sister.    ROS:  Please see the history of present illness.   All other systems are reviewed and negative.    PHYSICAL EXAM: VS:  BP 132/84 mmHg  Pulse 68  Ht 5' (1.524 m)  Wt 94 lb 12.8 oz (43.001 kg)  BMI 18.51 kg/m2 , BMI Body mass index is 18.51 kg/(m^2). GEN: Well nourished, well developed, in no acute distress HEENT: normal Neck: no JVD, carotid bruits, or masses Cardiac: RRR (paced),no edema  Respiratory:  clear to auscultation bilaterally, normal work of breathing GI: soft, nontender, nondistended, + BS MS: no deformity or atrophy Skin: warm and dry, device pocket is well healed Neuro:  Strength and sensation are intact Psych: euthymic mood, full affect  Device interrogation is reviewed today in detail.  See PaceArt for details.   Recent Labs: No results found for requested labs within last 365 days.    Lipid Panel  No results found for: CHOL, TRIG, HDL, CHOLHDL, VLDL, LDLCALC, LDLDIRECT   Wt Readings from Last 3 Encounters:  01/30/15 94 lb 12.8 oz (43.001 kg)  11/23/14 93 lb 12.8 oz (42.547 kg)  07/22/14 91 lb (41.277 kg)      Other studies Reviewed: Additional studies/  records that were reviewed today include: Dr Marlou Porch' notes    ASSESSMENT AND PLAN:   1. Symptomatic bradycardia/ tachy-brady syndrome Normal pacemaker function See Pace Art report No changes today  2. htn Stable No change required today  3. Permanent afib Rate controlled On eliquis  carelink Return to see EP NP in 1 year Follow-up with Dr Marlou Porch as scheduled   Current medicines are reviewed at length with the patient today.   The patient does not have concerns regarding her medicines.  The following changes were made today:  none  Signed, Thompson Grayer, MD  01/30/2015 2:52 PM     [facility]  [place] [address] [phone] (office) [phone] (fax)

## 2015-01-30 NOTE — Progress Notes (Signed)
[facility]. [address], [place]  [address] Phone: [phone] Fax:  [phone]  Date:  01/30/2015   ID:  [name], DOB [birth year], MRN [id number]  PCP:  Irven Shelling, MD  Electrophysiologist: Dr. Rayann Heman  History of Present Illness: [name] is a 81 y.o. female with atrial fibrillation, previously paroxysmal on amiodarone, pacemaker for sick sinus syndrome here for followup. Recently, pacemaker check demonstrated 100% atrial fibrillation.  Amiodarone was discontinued because of her permanent atrial fibrillation. Approximately 2 years ago, pacemaker check demonstrated only 20% atrial fibrillation.   She is doing well. She continues to lose weight. Not much appetite. Occasional ice cream cone. No bleeding on Eliquis.  Earlier today saw Dr. Rayann Heman, pleased with her pacemaker. 13 years.  She's not had any falls. Overall happy. Shortness of breath baseline.  Wt Readings from Last 3 Encounters:  01/30/15 94 lb 1.9 oz (42.693 kg)  01/30/15 94 lb 12.8 oz (43.001 kg)  11/23/14 93 lb 12.8 oz (42.547 kg)     Past Medical History  Diagnosis Date  . Lung cancer 2007    hx stage 1 BAC/ RML. s/p wedge resection from RML, superior segment RLL resection s/p seed implants  . Mycobacterial disease   . Permanent atrial fibrillation   . Bronchiectasis     bronch 10/2009 w/ stenotrophomonas sens to levaquin and bactrim  . Tachycardia-bradycardia syndrome 2008    sp PPM by Dr Leonia Reeves  . Osteoporosis   . Hypertension   . Hyperlipidemia   . Glaucoma   . Anxiety   . Sciatica   . Colonic diverticular abscess 01/26/2013  . SSS (sick sinus syndrome)   . Esophageal reflux   . Encounter for long-term (current) use of other medications   . Multinodular goiter (nontoxic)   . Thyrotoxicosis without mention of goiter or other cause, without mention of thyrotoxic crisis or storm   . Hyperthyroidism   . Glaucoma   . H/O hematuria     Neg workup Dahlstedt    . Adrenal adenoma     Neg endocrine workup  . Dry eyes   . History of diverticular abscess 2014  . Chronic headaches   . Diverticulitis 01/2013    Severe. Was hospitalized for this and she developed 100% a fib.    Past Surgical History  Procedure Laterality Date  . Wedge resection  03/2006    For lung cancer. Right middle lobe and right lower lobe superior segment with seed implants, Burney.  . Pacemaker insertion  01/2007    (MDT) Pacemaker syndrome 04/2009, new PM 05/2012 - Allred  . Laminectomy  11/1986  . Hernia repair  1988  . Bunionectomy  1982  . Cataract extraction, bilateral  04/2004 & 07/2004  . Permanent pacemaker generator change N/A 05/28/2012    Procedure: PERMANENT PACEMAKER GENERATOR CHANGE;  Surgeon: Thompson Grayer, MD;  Location: [facility] CATH LAB;  Service: Cardiovascular;  Laterality: N/A;    Current Outpatient Prescriptions  Medication Sig Dispense Refill  . albuterol (PROAIR HFA) 108 (90 BASE) MCG/ACT inhaler Inhale 2 puffs into the lungs every 6 (six) hours as needed for wheezing or shortness of breath. 3 Inhaler 0  . alendronate (FOSAMAX) 70 MG tablet Take 70 mg by mouth once a week. Take with a full glass of water on an empty stomach.    Marland Kitchen apixaban (ELIQUIS) 2.5 MG TABS tablet Take 2.5 mg by mouth 2 (two) times daily.    . Cholecalciferol (  VITAMIN D3) 1000 UNITS CAPS Take 1 capsule by mouth daily.    . cycloSPORINE (RESTASIS) 0.05 % ophthalmic emulsion Place 1 drop into both eyes 2 (two) times daily.     Marland Kitchen diltiazem (CARDIZEM) 60 MG tablet Take 60 mg by mouth 2 (two) times daily.    . DULoxetine (CYMBALTA) 30 MG capsule Take 30 mg by mouth daily.    Marland Kitchen HYDROcodone-acetaminophen (NORCO/VICODIN) 5-325 MG per tablet Take 1 tablet by mouth every 8 (eight) hours as needed for pain.    Marland Kitchen latanoprost (XALATAN) 0.005 % ophthalmic solution Place 1 drop into both eyes at bedtime.     . metoprolol (LOPRESSOR) 50 MG tablet TAKE 1 TABLET TWICE A DAY (Patient taking differently: TAKE 1  TABLET BY MOUTH TWICE A DAY) 180 tablet 1  . Multiple Vitamins-Minerals (ICAPS AREDS FORMULA PO) Take 1 capsule by mouth daily. MACULAR DEGENERATION SUPPLEMENT    . Olopatadine HCl (PATADAY OP) Apply to eye as directed.    . Omega-3 Fatty Acids (FISH OIL) 1000 MG CAPS Take 2 capsules by mouth daily.    Marland Kitchen omeprazole (PRILOSEC) 40 MG capsule Take 40 mg by mouth daily.    Marland Kitchen zolpidem (AMBIEN) 10 MG tablet Take 10 mg by mouth at bedtime as needed for sleep.     No current facility-administered medications for this visit.    Allergies:   No Known Allergies  Social History:  The patient  reports that she quit smoking about 11 years ago. Her smoking use included Cigarettes. She has a 27.5 pack-year smoking history. She has never used smokeless tobacco. She reports that she does not drink alcohol or use illicit drugs.   ROS:  Please see the history of present illness.   Denies any syncope, bleeding, orthopnea, PND, strokelike symptoms.    PHYSICAL EXAM: VS:  BP 132/84 mmHg  Pulse 68  Ht 5' (1.524 m)  Wt 94 lb 1.9 oz (42.693 kg)  BMI 18.38 kg/m2 Thin, in no acute distress HEENT: normaleye redness Neck: no JVD Cardiac:  irreg irreg no murmur Lungs:  Right lower lobe wheezing,mild rhonchi no rales Abd: soft, nontender, no hepatomegaly Ext: no edema Skin: warm and dryminor bruises Neuro: no focal abnormalities noted  EKG: 01/30/15-ventricular pacing noted, underlying the atrial fibrillation, occasional PVCs- Ventricular pacing. Permanent atrial fibrillation. Heart rate 76.    Labs: 2/15-creatinine 1.45, hemoglobin 15  ASSESSMENT AND PLAN:  1. Permanent atrial fibrillation-off of amiodarone. Excellent. She does feel better. No changes made. Rate controlled with diltiazem and metoprolol. 2. Pacemaker-functioning well. Dr. Rayann Heman. 2/16 eval.  3. Hypertension-currently well controlled. No changes made. 4. Chronic anticoagulation-lab work reviewed. Eliquis 2.5 mg twice a day. (Age, weight) Dr.  Laurann Montana labs 8/15. We need to continue to monitor this every 6 months 5. Protein calorie malnutrition-encourage protein, shakes. Expressed increased risk with decreased BMI. 6. 6 month followup  Signed, Candee Furbish, MD [facility]  01/30/2015 3:28 PM

## 2015-01-30 NOTE — Addendum Note (Signed)
Addended by: Shellia Cleverly on: 01/30/2015 03:39 PM   Modules accepted: Level of Service

## 2015-01-30 NOTE — Patient Instructions (Signed)
Medication Instructions:  Your physician recommends that you continue on your current medications as directed. Please refer to the Current Medication list given to you today.   Labwork: None oredered  Testing/Procedures: None ordered  Follow-Up: Your physician wants you to follow-up in: 12 months with Chanetta Marshall, NP   Remote monitoring is used to monitor your Pacemaker or ICD from home. This monitoring reduces the number of office visits required to check your device to one time per year. It allows [place] to keep an eye on the functioning of your device to ensure it is working properly. You are scheduled for a device check from home on 05/01/15. You may send your transmission at any time that day. If you have a wireless device, the transmission will be sent automatically. After your physician reviews your transmission, you will receive a postcard with your next transmission date.    Any Other Special Instructions Will Be Listed Below (If Applicable).

## 2015-01-31 DIAGNOSIS — H4011X0 Primary open-angle glaucoma, stage unspecified: Secondary | ICD-10-CM | POA: Diagnosis not present

## 2015-01-31 DIAGNOSIS — H409 Unspecified glaucoma: Secondary | ICD-10-CM | POA: Diagnosis not present

## 2015-02-03 ENCOUNTER — Encounter: Payer: Self-pay | Admitting: Internal Medicine

## 2015-02-15 ENCOUNTER — Encounter (INDEPENDENT_AMBULATORY_CARE_PROVIDER_SITE_OTHER): Payer: Medicare Other | Admitting: Ophthalmology

## 2015-02-22 ENCOUNTER — Encounter (INDEPENDENT_AMBULATORY_CARE_PROVIDER_SITE_OTHER): Payer: Medicare Other | Admitting: Ophthalmology

## 2015-02-22 DIAGNOSIS — H3531 Nonexudative age-related macular degeneration: Secondary | ICD-10-CM | POA: Diagnosis not present

## 2015-02-22 DIAGNOSIS — H35033 Hypertensive retinopathy, bilateral: Secondary | ICD-10-CM | POA: Diagnosis not present

## 2015-02-22 DIAGNOSIS — H43813 Vitreous degeneration, bilateral: Secondary | ICD-10-CM

## 2015-02-22 DIAGNOSIS — I10 Essential (primary) hypertension: Secondary | ICD-10-CM | POA: Diagnosis not present

## 2015-03-16 ENCOUNTER — Other Ambulatory Visit: Payer: Self-pay

## 2015-03-16 ENCOUNTER — Telehealth: Payer: Self-pay | Admitting: Cardiology

## 2015-03-16 MED ORDER — METOPROLOL TARTRATE 50 MG PO TABS: 50.0000 mg | ORAL_TABLET | Freq: Two times a day (BID) | ORAL | Status: DC

## 2015-03-16 NOTE — Telephone Encounter (Signed)
Meds.   Pt would like this office to order her Meloprolol.    [facility] please.

## 2015-04-04 DIAGNOSIS — H01003 Unspecified blepharitis right eye, unspecified eyelid: Secondary | ICD-10-CM | POA: Diagnosis not present

## 2015-04-04 DIAGNOSIS — H4011X2 Primary open-angle glaucoma, moderate stage: Secondary | ICD-10-CM | POA: Diagnosis not present

## 2015-04-04 DIAGNOSIS — Z961 Presence of intraocular lens: Secondary | ICD-10-CM | POA: Diagnosis not present

## 2015-04-04 DIAGNOSIS — H3531 Nonexudative age-related macular degeneration: Secondary | ICD-10-CM | POA: Diagnosis not present

## 2015-05-01 ENCOUNTER — Ambulatory Visit (INDEPENDENT_AMBULATORY_CARE_PROVIDER_SITE_OTHER): Payer: Medicare Other | Admitting: *Deleted

## 2015-05-01 ENCOUNTER — Telehealth: Payer: Self-pay | Admitting: Cardiology

## 2015-05-01 ENCOUNTER — Encounter: Payer: Self-pay | Admitting: Internal Medicine

## 2015-05-01 DIAGNOSIS — I495 Sick sinus syndrome: Secondary | ICD-10-CM | POA: Diagnosis not present

## 2015-05-01 NOTE — Telephone Encounter (Signed)
Spoke with pt and reminded pt of remote transmission that is due today. Pt verbalized understanding.   

## 2015-05-02 NOTE — Progress Notes (Signed)
Remote pacemaker transmission.   

## 2015-05-03 LAB — CUP PACEART REMOTE DEVICE CHECK
Battery Remaining Longevity: 140 mo
Battery Voltage: 2.79 V
Brady Statistic RV Percent Paced: 76 %
Lead Channel Impedance Value: 436 Ohm
Lead Channel Pacing Threshold Pulse Width: 0.4 ms
Lead Channel Sensing Intrinsic Amplitude: 8 mV
Lead Channel Setting Sensing Sensitivity: 2.8 mV
MDC IDC MSMT BATTERY IMPEDANCE: 134 Ohm
MDC IDC MSMT LEADCHNL RA IMPEDANCE VALUE: 67 Ohm
MDC IDC MSMT LEADCHNL RV PACING THRESHOLD AMPLITUDE: 1.125 V
MDC IDC SESS DTM: 20160815185118
MDC IDC SET LEADCHNL RV PACING AMPLITUDE: 2.5 V
MDC IDC SET LEADCHNL RV PACING PULSEWIDTH: 0.4 ms

## 2015-05-09 ENCOUNTER — Encounter: Payer: Self-pay | Admitting: Cardiology

## 2015-05-12 DIAGNOSIS — J449 Chronic obstructive pulmonary disease, unspecified: Secondary | ICD-10-CM | POA: Diagnosis not present

## 2015-05-12 DIAGNOSIS — I10 Essential (primary) hypertension: Secondary | ICD-10-CM | POA: Diagnosis not present

## 2015-05-12 DIAGNOSIS — R400 Somnolence: Secondary | ICD-10-CM | POA: Diagnosis not present

## 2015-05-12 DIAGNOSIS — E43 Unspecified severe protein-calorie malnutrition: Secondary | ICD-10-CM | POA: Diagnosis not present

## 2015-06-13 DIAGNOSIS — B029 Zoster without complications: Secondary | ICD-10-CM | POA: Diagnosis not present

## 2015-07-31 ENCOUNTER — Ambulatory Visit (INDEPENDENT_AMBULATORY_CARE_PROVIDER_SITE_OTHER): Payer: Medicare Other | Admitting: *Deleted

## 2015-07-31 ENCOUNTER — Telehealth: Payer: Self-pay | Admitting: Cardiology

## 2015-07-31 DIAGNOSIS — I495 Sick sinus syndrome: Secondary | ICD-10-CM

## 2015-07-31 NOTE — Telephone Encounter (Signed)
Spoke with pt and reminded pt of remote transmission that is due today. Pt verbalized understanding.   

## 2015-08-01 NOTE — Progress Notes (Signed)
Remote pacemaker transmission.   

## 2015-08-09 LAB — CUP PACEART REMOTE DEVICE CHECK
Battery Impedance: 158 Ohm
Brady Statistic RV Percent Paced: 70 %
Implantable Lead Implant Date: 20080505
Implantable Lead Implant Date: 20080505
Implantable Lead Location: 753859
Implantable Lead Model: 5076
Implantable Lead Model: 5076
Lead Channel Impedance Value: 414 Ohm
Lead Channel Impedance Value: 67 Ohm
Lead Channel Pacing Threshold Pulse Width: 0.4 ms
Lead Channel Sensing Intrinsic Amplitude: 8 mV
Lead Channel Setting Pacing Amplitude: 2.5 V
Lead Channel Setting Pacing Pulse Width: 0.76 ms
MDC IDC LEAD LOCATION: 753860
MDC IDC MSMT BATTERY REMAINING LONGEVITY: 119 mo
MDC IDC MSMT BATTERY VOLTAGE: 2.78 V
MDC IDC MSMT LEADCHNL RV PACING THRESHOLD AMPLITUDE: 1.25 V
MDC IDC SESS DTM: 20161114214940
MDC IDC SET LEADCHNL RV SENSING SENSITIVITY: 2.8 mV

## 2015-08-15 ENCOUNTER — Encounter: Payer: Self-pay | Admitting: Cardiology

## 2015-08-17 ENCOUNTER — Ambulatory Visit (INDEPENDENT_AMBULATORY_CARE_PROVIDER_SITE_OTHER): Payer: Medicare Other | Admitting: Cardiology

## 2015-08-17 ENCOUNTER — Encounter: Payer: Self-pay | Admitting: Cardiology

## 2015-08-17 VITALS — BP 130/70 | HR 78

## 2015-08-17 DIAGNOSIS — I481 Persistent atrial fibrillation: Secondary | ICD-10-CM | POA: Diagnosis not present

## 2015-08-17 DIAGNOSIS — Z950 Presence of cardiac pacemaker: Secondary | ICD-10-CM | POA: Diagnosis not present

## 2015-08-17 DIAGNOSIS — I495 Sick sinus syndrome: Secondary | ICD-10-CM

## 2015-08-17 DIAGNOSIS — I4819 Other persistent atrial fibrillation: Secondary | ICD-10-CM

## 2015-08-17 DIAGNOSIS — I10 Essential (primary) hypertension: Secondary | ICD-10-CM

## 2015-08-17 NOTE — Patient Instructions (Signed)

## 2015-08-17 NOTE — Progress Notes (Signed)
[facility]. [address] [place], [place]  [address] Phone: [phone] Fax:  [phone]  Date:  08/17/2015   ID:  [name], DOB 09-Jan-1927, MRN [id number]  PCP:  Irven Shelling, MD  Electrophysiologist: Dr. Rayann Heman  History of Present Illness: [name] is a 81 y.o. female with atrial fibrillation, previously paroxysmal on amiodarone, pacemaker for sick sinus syndrome here for followup. Recently, pacemaker check demonstrated 100% atrial fibrillation.  Amiodarone was discontinued because of her permanent atrial fibrillation. Recent pacemaker check demonstrated only 100% atrial fibrillation.  She denies chest pain, palpitations, syncope, weakness.  She reports compliance with Eliquis.  She denies gross blood loss but was started on Fe supplement for Fe-deficiency anemia in Sept 2016.  She reports dark stools and constipation since starting Fe supplement and notes improvement since she stopped it last week.  She did not feel particularly fatigued prior to starting Fe and has not noticed a change in energy level with Fe supplement.  She reports baseline DOE but denies orthopnea or PND.  She notes recent left ankle swelling in the past few weeks but denies prior surgery or trauma.     Wt Readings from Last 3 Encounters:  01/30/15 94 lb 1.9 oz (42.693 kg)  01/30/15 94 lb 12.8 oz (43.001 kg)  11/23/14 93 lb 12.8 oz (42.547 kg)     Past Medical History  Diagnosis Date  . Lung cancer ([place]) 2007    hx stage 1 BAC/ RML. s/p wedge resection from RML, superior segment RLL resection s/p seed implants  . Mycobacterial disease   . Permanent atrial fibrillation ([place])   . Bronchiectasis     bronch 10/2009 w/ stenotrophomonas sens to levaquin and bactrim  . Tachycardia-bradycardia syndrome ([place]) 2008    sp PPM by Dr Leonia Reeves  . Osteoporosis   . Hypertension   . Hyperlipidemia   . Glaucoma   . Anxiety   . Sciatica   . Colonic diverticular abscess 01/26/2013  .  SSS (sick sinus syndrome) ([place])   . Esophageal reflux   . Encounter for long-term (current) use of other medications   . Multinodular goiter (nontoxic)   . Thyrotoxicosis without mention of goiter or other cause, without mention of thyrotoxic crisis or storm   . Hyperthyroidism   . Glaucoma   . H/O hematuria     Neg workup Dahlstedt  . Adrenal adenoma     Neg endocrine workup  . Dry eyes   . History of diverticular abscess 2014  . Chronic headaches   . Diverticulitis 01/2013    Severe. Was hospitalized for this and she developed 100% a fib.    Past Surgical History  Procedure Laterality Date  . Wedge resection  03/2006    For lung cancer. Right middle lobe and right lower lobe superior segment with seed implants, Burney.  . Pacemaker insertion  01/2007    (MDT) Pacemaker syndrome 04/2009, new PM 05/2012 - Allred  . Laminectomy  11/1986  . Hernia repair  1988  . Bunionectomy  1982  . Cataract extraction, bilateral  04/2004 & 07/2004  . Permanent pacemaker generator change N/A 05/28/2012    Procedure: PERMANENT PACEMAKER GENERATOR CHANGE;  Surgeon: Thompson Grayer, MD;  Location: [facility] CATH LAB;  Service: Cardiovascular;  Laterality: N/A;    Current Outpatient Prescriptions  Medication Sig Dispense Refill  . albuterol (PROAIR HFA) 108 (90 BASE) MCG/ACT inhaler Inhale 2 puffs into the lungs every 6 (six) hours as  needed for wheezing or shortness of breath. 3 Inhaler 0  . alendronate (FOSAMAX) 70 MG tablet Take 70 mg by mouth once a week. Take with a full glass of water on an empty stomach.    Marland Kitchen apixaban (ELIQUIS) 2.5 MG TABS tablet Take 2.5 mg by mouth 2 (two) times daily.    . cycloSPORINE (RESTASIS) 0.05 % ophthalmic emulsion Place 1 drop into both eyes 2 (two) times daily.     . ferrous sulfate 325 (65 FE) MG tablet Take 325 mg by mouth daily with breakfast.    . HYDROcodone-acetaminophen (NORCO/VICODIN) 5-325 MG per tablet Take 1 tablet by mouth every 8 (eight) hours as needed for pain.      Marland Kitchen latanoprost (XALATAN) 0.005 % ophthalmic solution Place 1 drop into both eyes at bedtime.     . metoprolol (LOPRESSOR) 50 MG tablet Take 1 tablet (50 mg total) by mouth 2 (two) times daily. 180 tablet 1  . Multiple Vitamins-Minerals (ICAPS AREDS FORMULA PO) Take 1 capsule by mouth daily. MACULAR DEGENERATION SUPPLEMENT    . Olopatadine HCl (PATADAY OP) Apply to eye as directed.    . Omega-3 Fatty Acids (FISH OIL) 1000 MG CAPS Take 2 capsules by mouth daily.    Marland Kitchen zolpidem (AMBIEN) 10 MG tablet Take 10 mg by mouth at bedtime as needed for sleep.     No current facility-administered medications for this visit.    Allergies:   No Known Allergies  Social History:  The patient  reports that she quit smoking about 11 years ago. Her smoking use included Cigarettes. She has a 27.5 pack-year smoking history. She has never used smokeless tobacco. She reports that she does not drink alcohol or use illicit drugs.   ROS:  Please see the history of present illness.   Denies any chest pain, palpitations, syncope, bleeding, orthopnea, PND, strokelike symptoms.  Baseline dyspnea.  PHYSICAL EXAM: VS:  BP 130/70 mmHg  Pulse 78  SpO2 97% Thin, in no acute distress HEENT: normal Neck: no JVD Cardiac:  irreg irreg no murmur Lungs:  Mild upper lobe wheezes, no rales Abd: soft, nontender, no hepatomegaly Ext: left ankle slightly swollen compared to right, no redness or warmth Skin: warm and dry, minor bruises Neuro: no focal abnormalities noted  EKG: 01/30/15-ventricular pacing noted, underlying the atrial fibrillation, occasional PVCs- Ventricular pacing. Permanent atrial fibrillation. Heart rate 76.    Cr 1.3 - Aug 2016   ASSESSMENT AND PLAN:  1. Permanent atrial fibrillation-off of amiodarone. Rate controlled with diltiazem and metoprolol. 2. Pacemaker-functioning well. Dr. Rayann Heman note from 05/16 evaluation was reviewed.  She should follow-up with him in 05/17. 3. Hypertension-currently well  controlled. No changes made. 4. Chronic anticoagulation- lab work from Aug 2016 reviewed. Eliquis 2.5 mg twice a day. (Age, weight).  She will continue to get lab monitoring with Dr. Laurann Montana (PCP).  We need to continue to monitor this every 6 months 5. Protein calorie malnutrition - daughter feels she has gained a little weight back. Continue to encourage protein, shakes. Expressed increased risk with decreased BMI. 6. 6 month followup  Signed, Candee Furbish, MD [facility]  08/17/2015 10:59 AM

## 2015-09-07 ENCOUNTER — Other Ambulatory Visit: Payer: Self-pay | Admitting: Cardiology

## 2015-09-12 ENCOUNTER — Other Ambulatory Visit: Payer: Self-pay | Admitting: Internal Medicine

## 2015-09-13 NOTE — Telephone Encounter (Signed)
This medication is not on pt's med list. Please advise

## 2015-10-17 DIAGNOSIS — F419 Anxiety disorder, unspecified: Secondary | ICD-10-CM | POA: Diagnosis not present

## 2015-10-17 DIAGNOSIS — R609 Edema, unspecified: Secondary | ICD-10-CM | POA: Diagnosis not present

## 2015-10-17 DIAGNOSIS — I10 Essential (primary) hypertension: Secondary | ICD-10-CM | POA: Diagnosis not present

## 2015-10-17 DIAGNOSIS — R0600 Dyspnea, unspecified: Secondary | ICD-10-CM | POA: Diagnosis not present

## 2015-10-17 DIAGNOSIS — R600 Localized edema: Secondary | ICD-10-CM | POA: Diagnosis not present

## 2015-10-17 DIAGNOSIS — I4891 Unspecified atrial fibrillation: Secondary | ICD-10-CM | POA: Diagnosis not present

## 2015-10-18 ENCOUNTER — Ambulatory Visit
Admission: RE | Admit: 2015-10-18 | Discharge: 2015-10-18 | Disposition: A | Discharge status: Home / Self Care | Payer: Medicare Other | Source: Ambulatory Visit | Attending: Geriatric Medicine | Admitting: Geriatric Medicine

## 2015-10-18 ENCOUNTER — Other Ambulatory Visit: Payer: Self-pay | Admitting: Geriatric Medicine

## 2015-10-18 DIAGNOSIS — R0602 Shortness of breath: Secondary | ICD-10-CM | POA: Diagnosis not present

## 2015-10-18 DIAGNOSIS — R0600 Dyspnea, unspecified: Secondary | ICD-10-CM

## 2015-10-23 DIAGNOSIS — R609 Edema, unspecified: Secondary | ICD-10-CM | POA: Diagnosis not present

## 2015-10-23 DIAGNOSIS — Z1389 Encounter for screening for other disorder: Secondary | ICD-10-CM | POA: Diagnosis not present

## 2015-10-23 DIAGNOSIS — F419 Anxiety disorder, unspecified: Secondary | ICD-10-CM | POA: Diagnosis not present

## 2015-10-30 ENCOUNTER — Telehealth: Payer: Self-pay | Admitting: Cardiology

## 2015-10-30 ENCOUNTER — Ambulatory Visit (INDEPENDENT_AMBULATORY_CARE_PROVIDER_SITE_OTHER): Payer: Medicare Other | Admitting: *Deleted

## 2015-10-30 DIAGNOSIS — I495 Sick sinus syndrome: Secondary | ICD-10-CM | POA: Diagnosis not present

## 2015-10-30 NOTE — Telephone Encounter (Signed)
Spoke with pt and reminded pt of remote transmission that is due today. Pt verbalized understanding.   

## 2015-10-31 NOTE — Progress Notes (Signed)
Remote pacemaker transmission.   

## 2015-11-01 ENCOUNTER — Encounter: Payer: Self-pay | Admitting: Cardiology

## 2015-11-01 LAB — CUP PACEART REMOTE DEVICE CHECK
Battery Impedance: 158 Ohm
Battery Remaining Longevity: 132 mo
Battery Voltage: 2.79 V
Brady Statistic RV Percent Paced: 70 %
Date Time Interrogation Session: 20170213215910
Implantable Lead Implant Date: 20080505
Implantable Lead Location: 753859
Implantable Lead Location: 753860
Implantable Lead Model: 5076
Lead Channel Pacing Threshold Pulse Width: 0.4 ms
Lead Channel Setting Pacing Amplitude: 2.5 V
Lead Channel Setting Pacing Pulse Width: 0.46 ms
Lead Channel Setting Sensing Sensitivity: 4 mV
MDC IDC LEAD IMPLANT DT: 20080505
MDC IDC MSMT LEADCHNL RA IMPEDANCE VALUE: 67 Ohm
MDC IDC MSMT LEADCHNL RV IMPEDANCE VALUE: 434 Ohm
MDC IDC MSMT LEADCHNL RV PACING THRESHOLD AMPLITUDE: 1.125 V
MDC IDC MSMT LEADCHNL RV SENSING INTR AMPL: 8 mV

## 2015-11-13 DIAGNOSIS — Z79899 Other long term (current) drug therapy: Secondary | ICD-10-CM | POA: Diagnosis not present

## 2015-11-13 DIAGNOSIS — I10 Essential (primary) hypertension: Secondary | ICD-10-CM | POA: Diagnosis not present

## 2015-11-13 DIAGNOSIS — I5030 Unspecified diastolic (congestive) heart failure: Secondary | ICD-10-CM | POA: Diagnosis not present

## 2015-11-13 DIAGNOSIS — F419 Anxiety disorder, unspecified: Secondary | ICD-10-CM | POA: Diagnosis not present

## 2015-11-13 DIAGNOSIS — E43 Unspecified severe protein-calorie malnutrition: Secondary | ICD-10-CM | POA: Diagnosis not present

## 2015-11-13 DIAGNOSIS — R51 Headache: Secondary | ICD-10-CM | POA: Diagnosis not present

## 2016-02-01 DIAGNOSIS — H1013 Acute atopic conjunctivitis, bilateral: Secondary | ICD-10-CM | POA: Diagnosis not present

## 2016-02-01 DIAGNOSIS — H401112 Primary open-angle glaucoma, right eye, moderate stage: Secondary | ICD-10-CM | POA: Diagnosis not present

## 2016-02-01 DIAGNOSIS — H04123 Dry eye syndrome of bilateral lacrimal glands: Secondary | ICD-10-CM | POA: Diagnosis not present

## 2016-02-01 DIAGNOSIS — H401122 Primary open-angle glaucoma, left eye, moderate stage: Secondary | ICD-10-CM | POA: Diagnosis not present

## 2016-02-07 NOTE — Progress Notes (Signed)
Electrophysiology Office Note Date: 02/08/2016  ID:  [name], DOB 05-22-1927, MRN [id number]  PCP: Irven Shelling, MD Primary Cardiologist: Marlou Porch Electrophysiologist: [facility]  CC: Pacemaker follow-up  [name] is a 82 y.o. female seen today for Dr Rayann Heman.  She presents today for routine electrophysiology followup.  Since last being seen in our clinic, the patient reports doing reasonably well.  She had an episode of LE edema for which she was put on Lasix by Dr Felipa Eth. She has not had problems since.   She denies chest pain, palpitations, dyspnea, PND, orthopnea, nausea, vomiting, dizziness, syncope, edema, weight gain, or early satiety.  Device History: MDT dual chamber PPM implanted 2008 for SSS; gen change 2013   Past Medical History  Diagnosis Date  . Lung cancer ([place]) 2007    hx stage 1 BAC/ RML. s/p wedge resection from RML, superior segment RLL resection s/p seed implants  . Mycobacterial disease   . Permanent atrial fibrillation ([place])   . Bronchiectasis     bronch 10/2009 w/ stenotrophomonas sens to levaquin and bactrim  . Tachycardia-bradycardia syndrome ([employer]) 2008    sp PPM by Dr Leonia Reeves  . Osteoporosis   . Hypertension   . Hyperlipidemia   . Glaucoma   . Anxiety   . Sciatica   . Colonic diverticular abscess 01/26/2013  . SSS (sick sinus syndrome) ([place])   . Esophageal reflux   . Encounter for long-term (current) use of other medications   . Multinodular goiter (nontoxic)   . Thyrotoxicosis without mention of goiter or other cause, without mention of thyrotoxic crisis or storm   . Hyperthyroidism   . Glaucoma   . H/O hematuria     Neg workup Dahlstedt  . Adrenal adenoma     Neg endocrine workup  . Dry eyes   . History of diverticular abscess 2014  . Chronic headaches   . Diverticulitis 01/2013    Severe. Was hospitalized for this and she developed 100% a fib.   Past Surgical History  Procedure Laterality Date  . Wedge  resection  03/2006    For lung cancer. Right middle lobe and right lower lobe superior segment with seed implants, [facility]ney.  . Pacemaker insertion  01/2007    (MDT) Pacemaker syndrome 04/2009, new PM 05/2012 - Allred  . Laminectomy  11/1986  . Hernia repair  1988  . Bunionectomy  1982  . Cataract extraction, bilateral  04/2004 & 07/2004  . Permanent pacemaker generator change N/A 05/28/2012    Procedure: PERMANENT PACEMAKER GENERATOR CHANGE;  Surgeon: Thompson Grayer, MD;  Location: [facility] CATH LAB;  Service: Cardiovascular;  Laterality: N/A;    Current Outpatient Prescriptions  Medication Sig Dispense Refill  . albuterol (PROAIR HFA) 108 (90 BASE) MCG/ACT inhaler Inhale 2 puffs into the lungs every 6 (six) hours as needed for wheezing or shortness of breath. 3 Inhaler 0  . alendronate (FOSAMAX) 70 MG tablet Take 70 mg by mouth once a week. Take with a full glass of water on an empty stomach.    Marland Kitchen apixaban (ELIQUIS) 2.5 MG TABS tablet Take 2.5 mg by mouth 2 (two) times daily.    Marland Kitchen diltiazem (CARDIZEM CD) 240 MG 24 hr capsule Take 1 capsule (240 mg total) by mouth daily. 90 capsule 3  . furosemide (LASIX) 20 MG tablet Take 1 tablet by mouth 3 (three) times a week.    Marland Kitchen HYDROcodone-acetaminophen (NORCO/VICODIN) 5-325 MG per tablet Take 1 tablet by mouth every 8 (eight)  hours as needed for pain.    Marland Kitchen latanoprost (XALATAN) 0.005 % ophthalmic solution Place 1 drop into both eyes at bedtime.     . metoprolol (LOPRESSOR) 50 MG tablet TAKE 1 TABLET TWICE A DAY 180 tablet 3  . Multiple Vitamins-Minerals (ICAPS AREDS FORMULA PO) Take 1 capsule by mouth daily. MACULAR DEGENERATION SUPPLEMENT    . PATADAY 0.2 % SOLN as directed.    . sertraline (ZOLOFT) 50 MG tablet Take 1 tablet by mouth daily.    Marland Kitchen zolpidem (AMBIEN) 10 MG tablet Take 10 mg by mouth at bedtime as needed for sleep.     No current facility-administered medications for this visit.    Allergies:   Review of patient's allergies indicates no known  allergies.   Social History: Social History   Social History  . Marital Status: Widowed    Spouse Name: N/A  . Number of Children: 4  . Years of Education: N/A   Occupational History  . Homemaker    Social History Main Topics  . Smoking status: Former Smoker -- 0.50 packs/day for 55 years    Types: Cigarettes    Quit date: 12/16/2003  . Smokeless tobacco: Never Used  . Alcohol Use: No  . Drug Use: No  . Sexual Activity: No   Other Topics Concern  . Not on file   Social History Narrative   Widowed.  Lives alone.   Lives in [place].   Ambulates independently.             Family History: Family History  Problem Relation Age of Onset  . Ovarian cancer Sister   . CAD Sister   . Heart failure Father   . Heart disease Father   . Diabetes Mother   . Arthritis Mother   . Hypertension Sister   . Lung cancer Sister   . Cancer Son     Tonsil     Review of Systems: All other systems reviewed and are otherwise negative except as noted above.   Physical Exam: VS:  BP 142/84 mmHg  Pulse 76  Ht 5' (1.524 m)  Wt 99 lb (44.906 kg)  BMI 19.33 kg/m2 , BMI Body mass index is 19.33 kg/(m^2).  GEN- The patient is elderly and thin appearing, alert and oriented x 3 today.   HEENT: normocephalic, atraumatic; sclera clear, conjunctiva pink; hearing intact; oropharynx clear; neck supple  Lungs- Clear to ausculation bilaterally, normal work of breathing.  No wheezes, rales, rhonchi Heart- Irregular rate and rhythm  GI- soft, non-tender, non-distended, bowel sounds present  Extremities- no clubbing, cyanosis, or edema; DP/PT/radial pulses 2+ bilaterally MS- no significant deformity or atrophy Skin- warm and dry, no rash or lesion; PPM pocket well healed Psych- euthymic mood, full affect Neuro- strength and sensation are intact  PPM Interrogation- reviewed in detail today,  See PACEART report  EKG:  EKG is ordered today. The ekg ordered today shows atrial fibrillation  with ventricular pacing   Recent Labs: No results found for requested labs within last 365 days.   Wt Readings from Last 3 Encounters:  02/08/16 99 lb (44.906 kg)  01/30/15 94 lb 1.9 oz (42.693 kg)  01/30/15 94 lb 12.8 oz (43.001 kg)     Other studies Reviewed: Additional studies/ records that were reviewed today include: Dr Marlou Porch and Dr Jackalyn Lombard office notes  Assessment and Plan:  1.  Tachy/brady syndrome Normal PPM function See Pace Art report No changes today  2.  Permanent atrial fibrillation  V  rates controlled by device interrogation today Continue Eliquis for CHADS2VASC of  (2.'5mg'$  bid for age and weight) CBC, BMET today   3.  HTN Stable No change required today    Current medicines are reviewed at length with the patient today.   The patient does not have concerns regarding her medicines.  The following changes were made today:  none  Labs/ tests ordered today include: CBC, BMET  No orders of the defined types were placed in this encounter.     Disposition:   Follow up with [facility] transmissions, Dr Marlou Porch as scheduled, me in 1 year     Signed, Chanetta Marshall, NP 02/08/2016 1:56 PM  [place] [address] [phone] (office) [phone] (fax)

## 2016-02-08 ENCOUNTER — Ambulatory Visit (INDEPENDENT_AMBULATORY_CARE_PROVIDER_SITE_OTHER): Payer: Medicare Other | Admitting: Cardiology

## 2016-02-08 ENCOUNTER — Encounter: Payer: Self-pay | Admitting: Nurse Practitioner

## 2016-02-08 ENCOUNTER — Ambulatory Visit (INDEPENDENT_AMBULATORY_CARE_PROVIDER_SITE_OTHER): Payer: Medicare Other | Admitting: Nurse Practitioner

## 2016-02-08 ENCOUNTER — Encounter: Payer: Self-pay | Admitting: Internal Medicine

## 2016-02-08 ENCOUNTER — Encounter: Payer: Self-pay | Admitting: Cardiology

## 2016-02-08 VITALS — BP 142/84 | HR 76 | Ht 60.0 in | Wt 99.0 lb

## 2016-02-08 DIAGNOSIS — I482 Chronic atrial fibrillation: Secondary | ICD-10-CM

## 2016-02-08 DIAGNOSIS — I4821 Permanent atrial fibrillation: Secondary | ICD-10-CM

## 2016-02-08 DIAGNOSIS — I4891 Unspecified atrial fibrillation: Secondary | ICD-10-CM | POA: Diagnosis not present

## 2016-02-08 DIAGNOSIS — Z950 Presence of cardiac pacemaker: Secondary | ICD-10-CM

## 2016-02-08 DIAGNOSIS — I495 Sick sinus syndrome: Secondary | ICD-10-CM | POA: Diagnosis not present

## 2016-02-08 DIAGNOSIS — E43 Unspecified severe protein-calorie malnutrition: Secondary | ICD-10-CM

## 2016-02-08 DIAGNOSIS — I10 Essential (primary) hypertension: Secondary | ICD-10-CM

## 2016-02-08 LAB — CUP PACEART INCLINIC DEVICE CHECK
Battery Impedance: 158 Ohm
Brady Statistic RV Percent Paced: 72 %
Implantable Lead Implant Date: 20080505
Implantable Lead Location: 753859
Lead Channel Impedance Value: 437 Ohm
Lead Channel Impedance Value: 67 Ohm
Lead Channel Pacing Threshold Pulse Width: 0.4 ms
Lead Channel Sensing Intrinsic Amplitude: 11.2 mV
Lead Channel Setting Pacing Amplitude: 2.5 V
Lead Channel Setting Pacing Pulse Width: 0.4 ms
MDC IDC LEAD IMPLANT DT: 20080505
MDC IDC LEAD LOCATION: 753860
MDC IDC MSMT BATTERY REMAINING LONGEVITY: 134 mo
MDC IDC MSMT BATTERY VOLTAGE: 2.79 V
MDC IDC MSMT LEADCHNL RV PACING THRESHOLD AMPLITUDE: 1 V
MDC IDC SESS DTM: 20170525164051
MDC IDC SET LEADCHNL RV SENSING SENSITIVITY: 4 mV

## 2016-02-08 LAB — BASIC METABOLIC PANEL
BUN: 26 mg/dL — AB (ref 7–25)
CALCIUM: 9.7 mg/dL (ref 8.6–10.4)
CO2: 29 mmol/L (ref 20–31)
Chloride: 100 mmol/L (ref 98–110)
Creat: 1.09 mg/dL — ABNORMAL HIGH (ref 0.60–0.88)
GLUCOSE: 91 mg/dL (ref 65–99)
POTASSIUM: 3.9 mmol/L (ref 3.5–5.3)
Sodium: 141 mmol/L (ref 135–146)

## 2016-02-08 LAB — CBC
HEMATOCRIT: 46.6 % — AB (ref 35.0–45.0)
HEMOGLOBIN: 15.4 g/dL (ref 11.7–15.5)
MCH: 31.3 pg (ref 27.0–33.0)
MCHC: 33 g/dL (ref 32.0–36.0)
MCV: 94.7 fL (ref 80.0–100.0)
MPV: 9.6 fL (ref 7.5–12.5)
Platelets: 277 10*3/uL (ref 140–400)
RBC: 4.92 MIL/uL (ref 3.80–5.10)
RDW: 14 % (ref 11.0–15.0)
WBC: 7.3 10*3/uL (ref 3.8–10.8)

## 2016-02-08 NOTE — Progress Notes (Signed)
[name]. [address], [address] [place], [place]  [address] Phone: [phone] Fax:  [phone]  Date:  02/08/2016   ID:  [name], DOB 21-Jun-1927, MRN [id number]  PCP:  Irven Shelling, MD  Electrophysiologist: Dr. Rayann Heman  History of Present Illness: [name] is a 82 y.o. female with atrial fibrillation, previously paroxysmal on amiodarone, pacemaker for sick sinus syndrome here for followup. Pacemaker check demonstrated 100% atrial fibrillation.  Amiodarone was discontinued because of her permanent atrial fibrillation. Recent pacemaker check demonstrated only 100% atrial fibrillation.  She denies chest pain, palpitations, syncope, weakness.  She reports compliance with Eliquis.    She reports baseline DOE but denies orthopnea or PND.  She notes recent left ankle swelling in the past few weeks but denies prior surgery or trauma. She takes Lasix 3 times a week. She has been drinking a lot of water. I asked her to cut back to perhaps 11/2 L a day. She may be able to come off the Lasix and that situation. Does not have much of an appetite, no taste.    Wt Readings from Last 3 Encounters:  02/08/16 99 lb (44.906 kg)  02/08/16 99 lb (44.906 kg)  01/30/15 94 lb 1.9 oz (42.693 kg)     Past Medical History  Diagnosis Date  . Lung cancer ([place]) 2007    hx stage 1 BAC/ RML. s/p wedge resection from RML, superior segment RLL resection s/p seed implants  . Mycobacterial disease   . Permanent atrial fibrillation ([place])   . Bronchiectasis     bronch 10/2009 w/ stenotrophomonas sens to levaquin and bactrim  . Tachycardia-bradycardia syndrome ([place]) 2008    sp PPM by Dr Leonia Reeves  . Osteoporosis   . Hypertension   . Hyperlipidemia   . Glaucoma   . Anxiety   . Sciatica   . Colonic diverticular abscess 01/26/2013  . SSS (sick sinus syndrome) ([place])   . Esophageal reflux   . Encounter for long-term (current) use of other medications   . Multinodular goiter (nontoxic)    . Thyrotoxicosis without mention of goiter or other cause, without mention of thyrotoxic crisis or storm   . Hyperthyroidism   . Glaucoma   . H/O hematuria     Neg workup Dahlstedt  . Adrenal adenoma     Neg endocrine workup  . Dry eyes   . History of diverticular abscess 2014  . Chronic headaches   . Diverticulitis 01/2013    Severe. Was hospitalized for this and she developed 100% a fib.    Past Surgical History  Procedure Laterality Date  . Wedge resection  03/2006    For lung cancer. Right middle lobe and right lower lobe superior segment with seed implants, [place].  . Pacemaker insertion  01/2007    (MDT) Pacemaker syndrome 04/2009, new PM 05/2012 - Allred  . Laminectomy  11/1986  . Hernia repair  1988  . Bunionectomy  1982  . Cataract extraction, bilateral  04/2004 & 07/2004  . Permanent pacemaker generator change N/A 05/28/2012    Procedure: PERMANENT PACEMAKER GENERATOR CHANGE;  Surgeon: Thompson Grayer, MD;  Location: [facility] CATH LAB;  Service: Cardiovascular;  Laterality: N/A;    Current Outpatient Prescriptions  Medication Sig Dispense Refill  . albuterol (PROAIR HFA) 108 (90 BASE) MCG/ACT inhaler Inhale 2 puffs into the lungs every 6 (six) hours as needed for wheezing or shortness of breath. 3 Inhaler 0  . alendronate (FOSAMAX) 70 MG tablet  Take 70 mg by mouth once a week. Take with a full glass of water on an empty stomach.    Marland Kitchen apixaban (ELIQUIS) 2.5 MG TABS tablet Take 2.5 mg by mouth 2 (two) times daily.    Marland Kitchen diltiazem (CARDIZEM CD) 240 MG 24 hr capsule Take 1 capsule (240 mg total) by mouth daily. 90 capsule 3  . furosemide (LASIX) 20 MG tablet Take 1 tablet by mouth 3 (three) times a week.    Marland Kitchen HYDROcodone-acetaminophen (NORCO/VICODIN) 5-325 MG per tablet Take 1 tablet by mouth every 8 (eight) hours as needed for pain.    Marland Kitchen latanoprost (XALATAN) 0.005 % ophthalmic solution Place 1 drop into both eyes at bedtime.     . metoprolol (LOPRESSOR) 50 MG tablet TAKE 1 TABLET TWICE A  DAY 180 tablet 3  . Multiple Vitamins-Minerals (ICAPS AREDS FORMULA PO) Take 1 capsule by mouth daily. MACULAR DEGENERATION SUPPLEMENT    . PATADAY 0.2 % SOLN as directed.    . sertraline (ZOLOFT) 50 MG tablet Take 1 tablet by mouth daily.    Marland Kitchen zolpidem (AMBIEN) 10 MG tablet Take 10 mg by mouth at bedtime as needed for sleep.     No current facility-administered medications for this visit.    Allergies:   No Known Allergies  Social History:  The patient  reports that she quit smoking about 12 years ago. Her smoking use included Cigarettes. She has a 27.5 pack-year smoking history. She has never used smokeless tobacco. She reports that she does not drink alcohol or use illicit drugs.   ROS:  Please see the history of present illness.   Denies any chest pain, palpitations, syncope, bleeding, orthopnea, PND, strokelike symptoms.  Baseline dyspnea.Dry eyes.  PHYSICAL EXAM: VS:  BP 142/84 mmHg  Pulse 76  Ht 5' (1.524 m)  Wt 99 lb (44.906 kg)  BMI 19.33 kg/m2 Thin, in no acute distress HEENT: normal Neck: no JVD Cardiac:  irreg irreg no murmur Lungs:  Mild upper lobe wheezes, no rales Abd: soft, nontender, no hepatomegaly Ext: left ankle slightly swollen compared to right, no redness or warmth Skin: warm and dry, minor bruises Neuro: no focal abnormalities noted  EKG: EKG today 02/08/16-demand pacemaker 76 atrial fibrillation underlying, right bundle branch block. 01/30/15-ventricular pacing noted, underlying the atrial fibrillation, occasional PVCs- Ventricular pacing. Permanent atrial fibrillation. Heart rate 76.     Cr 1.3 - Aug 2016   ASSESSMENT AND PLAN:  1. Permanent atrial fibrillation-off of amiodarone. Rate controlled with diltiazem and metoprolol. Doing fairly well symptomatically. 2. Pacemaker-functioning well. Amber saw today as well. 3. Hypertension-currently well controlled. No changes made. 4. Chronic anticoagulation-  Eliquis 2.5 mg twice a day. (Age, weight).   Checking CBC and basic metabolic profile.  5. Protein calorie malnutrition - daughter feels she has gained a little weight back. Continue to encourage protein, shakes. Expressed increased risk with decreased BMI. 6. 6 month followup  Signed, Candee Furbish, MD [facility]  02/08/2016 2:01 PM

## 2016-02-08 NOTE — Patient Instructions (Signed)

## 2016-02-08 NOTE — Patient Instructions (Signed)
Medication Instructions:   Your physician recommends that you continue on your current medications as directed. Please refer to the Current Medication list given to you today.   If you need a refill on your cardiac medications before your next appointment, please call your pharmacy.  Labwork: CBC AND BMET TODAY   Testing/Procedures:  NONE ORDER TODAY    Follow-Up:  .Your physician wants you to follow-up in: [place] will receive a reminder letter in the mail two months in advance. If you don't receive a letter, please call our office to schedule the follow-up appointment.  Remote monitoring is used to monitor your Pacemaker of ICD from home. This monitoring reduces the number of office visits required to check your device to one time per year. It allows [place] to keep an eye on the functioning of your device to ensure it is working properly. You are scheduled for a device check from home on . 05/09/16.Marland KitchenMarland KitchenYou may send your transmission at any time that day. If you have a wireless device, the transmission will be sent automatically. After your physician reviews your transmission, you will receive a postcard with your next transmission date.      Any Other Special Instructions Will Be Listed Below (If Applicable).

## 2016-02-09 ENCOUNTER — Telehealth: Payer: Self-pay | Admitting: *Deleted

## 2016-02-09 NOTE — Telephone Encounter (Signed)
-----   Message from Michele Berthold, NP sent at 02/09/2016  7:18 AM EDT ----- Please notify patient of stable labs

## 2016-02-24 DIAGNOSIS — H109 Unspecified conjunctivitis: Secondary | ICD-10-CM | POA: Diagnosis not present

## 2016-02-26 DIAGNOSIS — S0501XA Injury of conjunctiva and corneal abrasion without foreign body, right eye, initial encounter: Secondary | ICD-10-CM | POA: Diagnosis not present

## 2016-02-26 DIAGNOSIS — H01003 Unspecified blepharitis right eye, unspecified eyelid: Secondary | ICD-10-CM | POA: Diagnosis not present

## 2016-02-26 DIAGNOSIS — H04123 Dry eye syndrome of bilateral lacrimal glands: Secondary | ICD-10-CM | POA: Diagnosis not present

## 2016-02-26 DIAGNOSIS — H1013 Acute atopic conjunctivitis, bilateral: Secondary | ICD-10-CM | POA: Diagnosis not present

## 2016-02-28 DIAGNOSIS — H18231 Secondary corneal edema, right eye: Secondary | ICD-10-CM | POA: Diagnosis not present

## 2016-02-28 DIAGNOSIS — H04123 Dry eye syndrome of bilateral lacrimal glands: Secondary | ICD-10-CM | POA: Diagnosis not present

## 2016-02-28 DIAGNOSIS — H20041 Secondary noninfectious iridocyclitis, right eye: Secondary | ICD-10-CM | POA: Diagnosis not present

## 2016-02-28 DIAGNOSIS — S0501XA Injury of conjunctiva and corneal abrasion without foreign body, right eye, initial encounter: Secondary | ICD-10-CM | POA: Diagnosis not present

## 2016-03-01 DIAGNOSIS — H20041 Secondary noninfectious iridocyclitis, right eye: Secondary | ICD-10-CM | POA: Diagnosis not present

## 2016-03-01 DIAGNOSIS — S0501XA Injury of conjunctiva and corneal abrasion without foreign body, right eye, initial encounter: Secondary | ICD-10-CM | POA: Diagnosis not present

## 2016-03-01 DIAGNOSIS — H04123 Dry eye syndrome of bilateral lacrimal glands: Secondary | ICD-10-CM | POA: Diagnosis not present

## 2016-03-01 DIAGNOSIS — H18231 Secondary corneal edema, right eye: Secondary | ICD-10-CM | POA: Diagnosis not present

## 2016-03-04 DIAGNOSIS — H18231 Secondary corneal edema, right eye: Secondary | ICD-10-CM | POA: Diagnosis not present

## 2016-03-04 DIAGNOSIS — H20041 Secondary noninfectious iridocyclitis, right eye: Secondary | ICD-10-CM | POA: Diagnosis not present

## 2016-03-04 DIAGNOSIS — B0233 Zoster keratitis: Secondary | ICD-10-CM | POA: Diagnosis not present

## 2016-03-12 DIAGNOSIS — H20041 Secondary noninfectious iridocyclitis, right eye: Secondary | ICD-10-CM | POA: Diagnosis not present

## 2016-03-12 DIAGNOSIS — H04123 Dry eye syndrome of bilateral lacrimal glands: Secondary | ICD-10-CM | POA: Diagnosis not present

## 2016-03-12 DIAGNOSIS — H1852 Epithelial (juvenile) corneal dystrophy: Secondary | ICD-10-CM | POA: Diagnosis not present

## 2016-03-12 DIAGNOSIS — B0233 Zoster keratitis: Secondary | ICD-10-CM | POA: Diagnosis not present

## 2016-03-25 DIAGNOSIS — H04123 Dry eye syndrome of bilateral lacrimal glands: Secondary | ICD-10-CM | POA: Diagnosis not present

## 2016-03-25 DIAGNOSIS — H21561 Pupillary abnormality, right eye: Secondary | ICD-10-CM | POA: Diagnosis not present

## 2016-03-25 DIAGNOSIS — H547 Unspecified visual loss: Secondary | ICD-10-CM | POA: Diagnosis not present

## 2016-04-15 DIAGNOSIS — H401112 Primary open-angle glaucoma, right eye, moderate stage: Secondary | ICD-10-CM | POA: Diagnosis not present

## 2016-04-15 DIAGNOSIS — H04123 Dry eye syndrome of bilateral lacrimal glands: Secondary | ICD-10-CM | POA: Diagnosis not present

## 2016-04-15 DIAGNOSIS — H401122 Primary open-angle glaucoma, left eye, moderate stage: Secondary | ICD-10-CM | POA: Diagnosis not present

## 2016-04-15 DIAGNOSIS — H547 Unspecified visual loss: Secondary | ICD-10-CM | POA: Diagnosis not present

## 2016-05-09 ENCOUNTER — Ambulatory Visit (INDEPENDENT_AMBULATORY_CARE_PROVIDER_SITE_OTHER): Payer: Medicare Other | Admitting: *Deleted

## 2016-05-09 ENCOUNTER — Telehealth: Payer: Self-pay | Admitting: Cardiology

## 2016-05-09 DIAGNOSIS — I495 Sick sinus syndrome: Secondary | ICD-10-CM

## 2016-05-09 DIAGNOSIS — Z950 Presence of cardiac pacemaker: Secondary | ICD-10-CM

## 2016-05-09 NOTE — Telephone Encounter (Signed)
Spoke with pt and reminded pt of remote transmission that is due today. Pt verbalized understanding.   

## 2016-05-09 NOTE — Progress Notes (Signed)
Remote pacemaker transmission.   

## 2016-05-14 DIAGNOSIS — Z5181 Encounter for therapeutic drug level monitoring: Secondary | ICD-10-CM | POA: Diagnosis not present

## 2016-05-14 DIAGNOSIS — I5030 Unspecified diastolic (congestive) heart failure: Secondary | ICD-10-CM | POA: Diagnosis not present

## 2016-05-14 DIAGNOSIS — B0230 Zoster ocular disease, unspecified: Secondary | ICD-10-CM | POA: Diagnosis not present

## 2016-05-14 DIAGNOSIS — E43 Unspecified severe protein-calorie malnutrition: Secondary | ICD-10-CM | POA: Diagnosis not present

## 2016-05-14 DIAGNOSIS — I10 Essential (primary) hypertension: Secondary | ICD-10-CM | POA: Diagnosis not present

## 2016-05-14 DIAGNOSIS — I480 Paroxysmal atrial fibrillation: Secondary | ICD-10-CM | POA: Diagnosis not present

## 2016-05-16 ENCOUNTER — Encounter: Payer: Self-pay | Admitting: Cardiology

## 2016-05-22 LAB — CUP PACEART REMOTE DEVICE CHECK
Battery Impedance: 182 Ohm
Battery Remaining Longevity: 128 mo
Battery Voltage: 2.79 V
Brady Statistic RV Percent Paced: 78 %
Date Time Interrogation Session: 20170824135626
Implantable Lead Location: 753860
Implantable Lead Model: 5076
Lead Channel Setting Pacing Amplitude: 2.5 V
Lead Channel Setting Pacing Pulse Width: 0.4 ms
Lead Channel Setting Sensing Sensitivity: 4 mV
MDC IDC LEAD IMPLANT DT: 20080505
MDC IDC LEAD IMPLANT DT: 20080505
MDC IDC LEAD LOCATION: 753859
MDC IDC MSMT LEADCHNL RA IMPEDANCE VALUE: 67 Ohm
MDC IDC MSMT LEADCHNL RV IMPEDANCE VALUE: 428 Ohm
MDC IDC MSMT LEADCHNL RV PACING THRESHOLD AMPLITUDE: 1.125 V
MDC IDC MSMT LEADCHNL RV PACING THRESHOLD PULSEWIDTH: 0.4 ms
MDC IDC MSMT LEADCHNL RV SENSING INTR AMPL: 8 mV

## 2016-06-03 DIAGNOSIS — H353120 Nonexudative age-related macular degeneration, left eye, stage unspecified: Secondary | ICD-10-CM | POA: Diagnosis not present

## 2016-06-03 DIAGNOSIS — H401122 Primary open-angle glaucoma, left eye, moderate stage: Secondary | ICD-10-CM | POA: Diagnosis not present

## 2016-06-03 DIAGNOSIS — H353110 Nonexudative age-related macular degeneration, right eye, stage unspecified: Secondary | ICD-10-CM | POA: Diagnosis not present

## 2016-06-03 DIAGNOSIS — H401112 Primary open-angle glaucoma, right eye, moderate stage: Secondary | ICD-10-CM | POA: Diagnosis not present

## 2016-07-21 DIAGNOSIS — Z23 Encounter for immunization: Secondary | ICD-10-CM | POA: Diagnosis not present

## 2016-08-06 ENCOUNTER — Encounter (INDEPENDENT_AMBULATORY_CARE_PROVIDER_SITE_OTHER): Payer: Self-pay

## 2016-08-06 ENCOUNTER — Encounter: Payer: Self-pay | Admitting: Cardiology

## 2016-08-06 ENCOUNTER — Ambulatory Visit (INDEPENDENT_AMBULATORY_CARE_PROVIDER_SITE_OTHER): Payer: Medicare Other | Admitting: Cardiology

## 2016-08-06 VITALS — BP 134/68 | HR 86 | Ht 60.0 in | Wt 100.0 lb

## 2016-08-06 DIAGNOSIS — Z79899 Other long term (current) drug therapy: Secondary | ICD-10-CM

## 2016-08-06 DIAGNOSIS — I481 Persistent atrial fibrillation: Secondary | ICD-10-CM

## 2016-08-06 DIAGNOSIS — Z950 Presence of cardiac pacemaker: Secondary | ICD-10-CM | POA: Diagnosis not present

## 2016-08-06 DIAGNOSIS — E43 Unspecified severe protein-calorie malnutrition: Secondary | ICD-10-CM | POA: Diagnosis not present

## 2016-08-06 DIAGNOSIS — I4819 Other persistent atrial fibrillation: Secondary | ICD-10-CM

## 2016-08-06 LAB — CBC
HCT: 43.5 % (ref 35.0–45.0)
HEMOGLOBIN: 13.6 g/dL (ref 11.7–15.5)
MCH: 30 pg (ref 27.0–33.0)
MCHC: 31.3 g/dL — ABNORMAL LOW (ref 32.0–36.0)
MCV: 96 fL (ref 80.0–100.0)
MPV: 9.4 fL (ref 7.5–12.5)
PLATELETS: 315 10*3/uL (ref 140–400)
RBC: 4.53 MIL/uL (ref 3.80–5.10)
RDW: 13.7 % (ref 11.0–15.0)
WBC: 6.8 10*3/uL (ref 3.8–10.8)

## 2016-08-06 LAB — BASIC METABOLIC PANEL
BUN: 23 mg/dL (ref 7–25)
CALCIUM: 9.1 mg/dL (ref 8.6–10.4)
CO2: 29 mmol/L (ref 20–31)
Chloride: 103 mmol/L (ref 98–110)
Creat: 1.02 mg/dL — ABNORMAL HIGH (ref 0.60–0.88)
GLUCOSE: 89 mg/dL (ref 65–99)
POTASSIUM: 3.8 mmol/L (ref 3.5–5.3)
SODIUM: 140 mmol/L (ref 135–146)

## 2016-08-06 NOTE — Progress Notes (Signed)
[facility]. [address] [place], [place]  [address] Phone: [phone] Fax:  [phone]  Date:  08/06/2016   ID:  [name], DOB Apr 26, 1927, MRN [id number]  PCP:  Irven Shelling, MD  Electrophysiologist: Dr. Rayann Heman  History of Present Illness: [name] is a 82 y.o. female with atrial fibrillation, previously paroxysmal on amiodarone, pacemaker for sick sinus syndrome here for followup. Pacemaker check demonstrated 100% atrial fibrillation.  Amiodarone was discontinued because of her permanent atrial fibrillation. Recent pacemaker check demonstrated only 100% atrial fibrillation.  She denies chest pain, palpitations, syncope, weakness.  She reports compliance with Eliquis.  No bleeding issues.  She reports baseline DOE but denies orthopnea or PND.  She notes recent left ankle swelling in the past few weeks but denies prior surgery or trauma. She takes Lasix 3 times a week. She has been drinking a lot of water. I asked her to cut back to perhaps 11/2 L a day. She may be able to come off the Lasix and that situation. Does not have much of an appetite, no taste.  She sometimes has mucousy froth, during eating. This can be distressful. She has discussed with Dr. Laurann Montana.  She also dealt with shingles that affected her eye. Thankfully they resolved.  Wt Readings from Last 3 Encounters:  08/06/16 100 lb (45.4 kg)  02/08/16 99 lb (44.9 kg)  02/08/16 99 lb (44.9 kg)     Past Medical History:  Diagnosis Date  . Adrenal adenoma    Neg endocrine workup  . Anxiety   . Bronchiectasis    bronch 10/2009 w/ stenotrophomonas sens to levaquin and bactrim  . Chronic headaches   . Colonic diverticular abscess 01/26/2013  . Diverticulitis 01/2013   Severe. Was hospitalized for this and she developed 100% a fib.  . Dry eyes   . Encounter for long-term (current) use of other medications   . Esophageal reflux   . Glaucoma   . Glaucoma   . H/O hematuria    Neg  workup Dahlstedt  . History of diverticular abscess 2014  . Hyperlipidemia   . Hypertension   . Hyperthyroidism   . Lung cancer ([place]) 2007   hx stage 1 BAC/ RML. s/p wedge resection from RML, superior segment RLL resection s/p seed implants  . Multinodular goiter (nontoxic)   . Mycobacterial disease   . Osteoporosis   . Permanent atrial fibrillation ([place])   . Sciatica   . SSS (sick sinus syndrome) ([place])   . Tachycardia-bradycardia syndrome ([place]) 2008   sp PPM by Dr Leonia Reeves  . Thyrotoxicosis without mention of goiter or other cause, without mention of thyrotoxic crisis or storm     Past Surgical History:  Procedure Laterality Date  . [place]  . CATARACT EXTRACTION, BILATERAL  04/2004 & 07/2004  . HERNIA REPAIR  1988  . LAMINECTOMY  11/1986  . PACEMAKER INSERTION  01/2007   (MDT) Pacemaker syndrome 04/2009, new PM 05/2012 - Allred  . PERMANENT PACEMAKER GENERATOR CHANGE N/A 05/28/2012   Procedure: PERMANENT PACEMAKER GENERATOR CHANGE;  Surgeon: Thompson Grayer, MD;  Location: [facility] CATH LAB;  Service: Cardiovascular;  Laterality: N/A;  . WEDGE RESECTION  03/2006   For lung cancer. Right middle lobe and right lower lobe superior segment with seed implants, [place].    Current Outpatient Prescriptions  Medication Sig Dispense Refill  . alendronate (FOSAMAX) 70 MG tablet Take 70 mg by mouth once a week. Take with a  full glass of water on an empty stomach.    Marland Kitchen apixaban (ELIQUIS) 2.5 MG TABS tablet Take 2.5 mg by mouth 2 (two) times daily.    Marland Kitchen diltiazem (CARDIZEM CD) 240 MG 24 hr capsule Take 1 capsule (240 mg total) by mouth daily. 90 capsule 3  . furosemide (LASIX) 20 MG tablet Take 1 tablet by mouth 3 (three) times a week.    Marland Kitchen HYDROcodone-acetaminophen (NORCO/VICODIN) 5-325 MG per tablet Take 1 tablet by mouth every 8 (eight) hours as needed for pain.    Marland Kitchen latanoprost (XALATAN) 0.005 % ophthalmic solution Place 1 drop into both eyes at bedtime.     . metoprolol (LOPRESSOR) 50 MG  tablet TAKE 1 TABLET TWICE A DAY 180 tablet 3  . Multiple Vitamins-Minerals (ICAPS AREDS FORMULA PO) Take 1 capsule by mouth daily. MACULAR DEGENERATION SUPPLEMENT    . PATADAY 0.2 % SOLN as directed.    . sertraline (ZOLOFT) 50 MG tablet Take 1 tablet by mouth daily.    Marland Kitchen zolpidem (AMBIEN) 10 MG tablet Take 10 mg by mouth at bedtime as needed for sleep.     No current facility-administered medications for this visit.     Allergies:   No Known Allergies  Social History:  The patient  reports that she quit smoking about 12 years ago. Her smoking use included Cigarettes. She has a 27.50 pack-year smoking history. She has never used smokeless tobacco. She reports that she does not drink alcohol or use drugs.   ROS:  Please see the history of present illness.   Denies any chest pain, palpitations, syncope, bleeding, orthopnea, PND, strokelike symptoms.  Baseline dyspnea.Dry eyes.  PHYSICAL EXAM: VS:  BP 134/68   Pulse 86   Ht 5' (1.524 m)   Wt 100 lb (45.4 kg)   LMP  (LMP Unknown)   BMI 19.53 kg/m  Thin, in no acute distress HEENT: normal Neck: no JVD Cardiac:  irreg irreg no murmur Lungs:  Mild upper lobe wheezes, no rales Abd: soft, nontender, no hepatomegaly Ext: left ankle slightly swollen compared to right, no redness or warmth Skin: warm and dry, minor bruises Neuro: no focal abnormalities noted  EKG: EKG today 02/08/16-demand pacemaker 76 atrial fibrillation underlying, right bundle branch block. 01/30/15-ventricular pacing noted, underlying the atrial fibrillation, occasional PVCs- Ventricular pacing. Permanent atrial fibrillation. Heart rate 76.     Creatinine 1.09 on 02/08/16  ASSESSMENT AND PLAN:  1. Permanent atrial fibrillation-off of amiodarone. Rate controlled with diltiazem and metoprolol. Doing fairly well symptomatically. 2. Pacemaker-functioning well. Amber saw today as well. 3. Hypertension-currently well controlled. No changes made. 4. Chronic anticoagulation-   Eliquis 2.5 mg twice a day. (Age, weight).  Checking CBC and basic metabolic profile.  5. Protein calorie malnutrition - daughter feels she has gained a little weight back. Continue to encourage protein, shakes. Expressed increased risk with decreased BMI. She broke 100 pounds. 6. 6 month followup  Signed, Candee Furbish, MD [facility]  08/06/2016 2:31 PM

## 2016-08-06 NOTE — Patient Instructions (Signed)
Medication Instructions:  The current medical regimen is effective;  continue present plan and medications.  Labwork: Please have blood work today (CBC, BMP)  Follow-Up: Follow up in 6 months with Dr. Marlou Porch.  You will receive a letter in the mail 2 months before you are due.  Please call us when you receive this letter to schedule your follow up appointment.  If you need a refill on your cardiac medications before your next appointment, please call your pharmacy.  Thank you for choosing [facility]!!

## 2016-08-07 ENCOUNTER — Telehealth: Payer: Self-pay | Admitting: *Deleted

## 2016-08-07 NOTE — Telephone Encounter (Signed)
Patient informed. 

## 2016-08-07 NOTE — Telephone Encounter (Signed)
-----   Message from Jerline Pain, MD sent at 08/07/2016  7:11 AM EST ----- Excellent labs Candee Furbish, MD

## 2016-08-13 ENCOUNTER — Encounter: Payer: Medicare Other | Admitting: *Deleted

## 2016-08-13 ENCOUNTER — Telehealth: Payer: Self-pay | Admitting: Cardiology

## 2016-08-13 NOTE — Telephone Encounter (Signed)
LMOVM reminding pt to send remote transmission.   

## 2016-08-16 ENCOUNTER — Encounter: Payer: Self-pay | Admitting: Cardiology

## 2016-08-23 ENCOUNTER — Ambulatory Visit (INDEPENDENT_AMBULATORY_CARE_PROVIDER_SITE_OTHER): Payer: Medicare Other | Admitting: *Deleted

## 2016-08-23 DIAGNOSIS — I495 Sick sinus syndrome: Secondary | ICD-10-CM

## 2016-08-26 NOTE — Progress Notes (Signed)
Remote pacemaker transmission.   

## 2016-08-28 ENCOUNTER — Encounter: Payer: Self-pay | Admitting: Cardiology

## 2016-09-11 ENCOUNTER — Ambulatory Visit
Admission: RE | Admit: 2016-09-11 | Discharge: 2016-09-11 | Disposition: A | Discharge status: Home / Self Care | Payer: Medicare Other | Source: Ambulatory Visit | Attending: Nurse Practitioner | Admitting: Nurse Practitioner

## 2016-09-11 ENCOUNTER — Other Ambulatory Visit: Payer: Self-pay | Admitting: Nurse Practitioner

## 2016-09-11 DIAGNOSIS — R062 Wheezing: Secondary | ICD-10-CM | POA: Diagnosis not present

## 2016-09-11 DIAGNOSIS — R05 Cough: Secondary | ICD-10-CM | POA: Diagnosis not present

## 2016-09-11 DIAGNOSIS — R059 Cough, unspecified: Secondary | ICD-10-CM

## 2016-09-11 DIAGNOSIS — R0609 Other forms of dyspnea: Secondary | ICD-10-CM | POA: Diagnosis not present

## 2016-09-15 ENCOUNTER — Other Ambulatory Visit: Payer: Self-pay | Admitting: Cardiology

## 2016-09-18 DIAGNOSIS — J069 Acute upper respiratory infection, unspecified: Secondary | ICD-10-CM | POA: Diagnosis not present

## 2016-09-18 DIAGNOSIS — B9789 Other viral agents as the cause of diseases classified elsewhere: Secondary | ICD-10-CM | POA: Diagnosis not present

## 2016-09-18 DIAGNOSIS — R12 Heartburn: Secondary | ICD-10-CM | POA: Diagnosis not present

## 2016-09-18 LAB — CUP PACEART REMOTE DEVICE CHECK
Battery Voltage: 2.78 V
Brady Statistic RV Percent Paced: 85 %
Implantable Lead Implant Date: 20080505
Implantable Lead Location: 753859
Implantable Lead Location: 753860
Implantable Lead Model: 5076
Implantable Pulse Generator Implant Date: 20130912
Lead Channel Impedance Value: 67 Ohm
Lead Channel Pacing Threshold Amplitude: 1.25 V
Lead Channel Pacing Threshold Pulse Width: 0.4 ms
Lead Channel Setting Pacing Amplitude: 2.5 V
Lead Channel Setting Pacing Pulse Width: 0.4 ms
MDC IDC LEAD IMPLANT DT: 20080505
MDC IDC MSMT BATTERY IMPEDANCE: 158 Ohm
MDC IDC MSMT BATTERY REMAINING LONGEVITY: 131 mo
MDC IDC MSMT LEADCHNL RV IMPEDANCE VALUE: 414 Ohm
MDC IDC SESS DTM: 20171208225623
MDC IDC SET LEADCHNL RV SENSING SENSITIVITY: 2.8 mV

## 2016-10-24 DIAGNOSIS — H04123 Dry eye syndrome of bilateral lacrimal glands: Secondary | ICD-10-CM | POA: Diagnosis not present

## 2016-10-24 DIAGNOSIS — H401132 Primary open-angle glaucoma, bilateral, moderate stage: Secondary | ICD-10-CM | POA: Diagnosis not present

## 2016-10-24 DIAGNOSIS — H1013 Acute atopic conjunctivitis, bilateral: Secondary | ICD-10-CM | POA: Diagnosis not present

## 2016-11-15 DIAGNOSIS — I480 Paroxysmal atrial fibrillation: Secondary | ICD-10-CM | POA: Diagnosis not present

## 2016-11-15 DIAGNOSIS — J449 Chronic obstructive pulmonary disease, unspecified: Secondary | ICD-10-CM | POA: Diagnosis not present

## 2016-11-15 DIAGNOSIS — M79671 Pain in right foot: Secondary | ICD-10-CM | POA: Diagnosis not present

## 2016-11-15 DIAGNOSIS — M79672 Pain in left foot: Secondary | ICD-10-CM | POA: Diagnosis not present

## 2016-11-15 DIAGNOSIS — Z1389 Encounter for screening for other disorder: Secondary | ICD-10-CM | POA: Diagnosis not present

## 2016-11-15 DIAGNOSIS — I10 Essential (primary) hypertension: Secondary | ICD-10-CM | POA: Diagnosis not present

## 2016-11-15 DIAGNOSIS — I5030 Unspecified diastolic (congestive) heart failure: Secondary | ICD-10-CM | POA: Diagnosis not present

## 2016-11-15 DIAGNOSIS — E43 Unspecified severe protein-calorie malnutrition: Secondary | ICD-10-CM | POA: Diagnosis not present

## 2016-11-15 DIAGNOSIS — M810 Age-related osteoporosis without current pathological fracture: Secondary | ICD-10-CM | POA: Diagnosis not present

## 2016-11-15 DIAGNOSIS — Z0000 Encounter for general adult medical examination without abnormal findings: Secondary | ICD-10-CM | POA: Diagnosis not present

## 2016-11-25 ENCOUNTER — Telehealth: Payer: Self-pay | Admitting: Cardiology

## 2016-11-25 ENCOUNTER — Ambulatory Visit (INDEPENDENT_AMBULATORY_CARE_PROVIDER_SITE_OTHER): Payer: Medicare Other | Admitting: *Deleted

## 2016-11-25 DIAGNOSIS — I495 Sick sinus syndrome: Secondary | ICD-10-CM | POA: Diagnosis not present

## 2016-11-25 NOTE — Telephone Encounter (Signed)
Spoke with pt and reminded pt of remote transmission that is due today. Pt verbalized understanding.   

## 2016-11-27 ENCOUNTER — Encounter: Payer: Self-pay | Admitting: Cardiology

## 2016-11-27 NOTE — Progress Notes (Signed)
Remote pacemaker transmission.   

## 2016-11-28 ENCOUNTER — Encounter: Payer: Self-pay | Admitting: Podiatry

## 2016-11-28 ENCOUNTER — Ambulatory Visit (INDEPENDENT_AMBULATORY_CARE_PROVIDER_SITE_OTHER): Payer: Medicare Other

## 2016-11-28 ENCOUNTER — Ambulatory Visit (INDEPENDENT_AMBULATORY_CARE_PROVIDER_SITE_OTHER): Payer: Medicare Other | Admitting: Podiatry

## 2016-11-28 DIAGNOSIS — M722 Plantar fascial fibromatosis: Secondary | ICD-10-CM | POA: Diagnosis not present

## 2016-11-28 DIAGNOSIS — M79672 Pain in left foot: Principal | ICD-10-CM

## 2016-11-28 DIAGNOSIS — L600 Ingrowing nail: Secondary | ICD-10-CM | POA: Diagnosis not present

## 2016-11-28 DIAGNOSIS — M79671 Pain in right foot: Secondary | ICD-10-CM

## 2016-11-28 MED ORDER — TRIAMCINOLONE ACETONIDE 10 MG/ML IJ SUSP
10.0000 mg | Freq: Once | INTRAMUSCULAR | Status: AC
  Administered 2016-11-28: 10 mg

## 2016-11-28 NOTE — Progress Notes (Signed)
   Subjective:    Patient ID: [name], female    DOB: Sep 02, 1927, 83 y.o.   MRN: [id number]  HPI  Chief Complaint  Patient presents with  . Foot Pain    Right; Arch x 1 year. Left; "Toes are over lapping each other"   . Nail Problem    BL; Great Toes;  Medial side x "for years".   . Callouses    Right; Great Toe; Medial Side.        Review of Systems     Objective:   Physical Exam        Assessment & Plan:

## 2016-11-29 LAB — CUP PACEART REMOTE DEVICE CHECK
Battery Impedance: 206 Ohm
Battery Remaining Longevity: 114 mo
Battery Voltage: 2.79 V
Date Time Interrogation Session: 20180313225413
Implantable Lead Implant Date: 20080505
Implantable Lead Location: 753860
Implantable Lead Model: 5076
Lead Channel Impedance Value: 404 Ohm
Lead Channel Setting Sensing Sensitivity: 2.8 mV
MDC IDC LEAD IMPLANT DT: 20080505
MDC IDC LEAD LOCATION: 753859
MDC IDC MSMT LEADCHNL RA IMPEDANCE VALUE: 67 Ohm
MDC IDC MSMT LEADCHNL RV PACING THRESHOLD AMPLITUDE: 1.375 V
MDC IDC MSMT LEADCHNL RV PACING THRESHOLD PULSEWIDTH: 0.4 ms
MDC IDC PG IMPLANT DT: 20130912
MDC IDC SET LEADCHNL RV PACING AMPLITUDE: 2.75 V
MDC IDC SET LEADCHNL RV PACING PULSEWIDTH: 0.4 ms
MDC IDC STAT BRADY RV PERCENT PACED: 85 %

## 2016-11-29 NOTE — Progress Notes (Signed)
Subjective:     Patient ID: [name], female   DOB: Jun 20, 1927, 83 y.o.   MRN: [id number]  HPI patient presents with pain in the right arch and also very painful hallux nails bilateral with the right having a damage nailbed and the left being ingrown on the medial lateral side. Presents with caregiver   Review of Systems  All other systems reviewed and are negative.      Objective:   Physical Exam  Constitutional: She is oriented to person, place, and time.  Cardiovascular: Intact distal pulses.   Musculoskeletal: Normal range of motion.  Neurological: She is oriented to person, place, and time.  Skin: Skin is warm.  Nursing note and vitals reviewed.  neurovascular status found to be intact muscle strength was adequate with range of motion diminished subtalar midtarsal joint. Patient's found to have incurvated hallux nail bed left medial lateral side damage right hallux nail and pain in the mid arch region right with depression of the arch noted. 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 Today I injected the mid arch region right 3 mg Kenalog 5 mill grams Xylocaine and applied fascial brace instructed on wider shoes with support and reappoint to review this and to do nail surgery  X-ray report indicates depression of the arch right with no indications of calcification

## 2016-12-03 ENCOUNTER — Ambulatory Visit (INDEPENDENT_AMBULATORY_CARE_PROVIDER_SITE_OTHER): Payer: Medicare Other | Admitting: Podiatry

## 2016-12-03 ENCOUNTER — Encounter: Payer: Self-pay | Admitting: Podiatry

## 2016-12-03 DIAGNOSIS — L600 Ingrowing nail: Secondary | ICD-10-CM | POA: Diagnosis not present

## 2016-12-03 DIAGNOSIS — M722 Plantar fascial fibromatosis: Secondary | ICD-10-CM | POA: Diagnosis not present

## 2016-12-03 NOTE — Patient Instructions (Signed)

## 2016-12-03 NOTE — Progress Notes (Signed)
Subjective:     Patient ID: [name], female   DOB: May 03, 1927, 83 y.o.   MRN: [id number]  HPI patient presents stating that she needs to have 2 toenails taken care of due to chronic pain and she's still having quite a bit of pain in her mid arch   Review of Systems     Objective:   Physical Exam Neurovascular status was found to be intact with good digital perfusion and patient was noted to have incurvated hallux nail right medial lateral borders and the entire nail left which is damaged thickened and painful. Patient is found to have inflammation with a small nodule in the mid arch area right that did not respond to previous cortisone injection    Assessment:     Chronic ingrown toenail deformity hallux bilateral with probable small fascial irritation of the right mid arch that is painful    Plan:     H&P and both conditions reviewed. As far as the fascia I did educate her on plantar fasciitis and dispensed a night splint with instructions for hot cold therapy and worked on with her caregiver the nails we discussed permanent procedure and I explained to her risk and she wants surgery and today I infiltrated each hallux 36 Milligan's I can Marcaine mixture and under sterile conditions I removed the medial lateral borders the right hallux and the entire left hallux exposed matrix and applied 3 treatments to the right for treatments the left with alcohol lavage and sterile dressings. Keep appointment that is scheduled for next several weeks

## 2016-12-30 ENCOUNTER — Ambulatory Visit (INDEPENDENT_AMBULATORY_CARE_PROVIDER_SITE_OTHER): Payer: Medicare Other | Admitting: Podiatry

## 2016-12-30 ENCOUNTER — Encounter: Payer: Self-pay | Admitting: Podiatry

## 2016-12-30 DIAGNOSIS — L723 Sebaceous cyst: Secondary | ICD-10-CM

## 2016-12-30 DIAGNOSIS — M722 Plantar fascial fibromatosis: Secondary | ICD-10-CM

## 2016-12-30 NOTE — Progress Notes (Signed)
Subjective:     Patient ID: [name], female   DOB: [birth year], 83 y.o.   MRN: [id number]  HPI patient states that her nails are doing real good but there still is discomfort in the plantar aspect of the right arch. Presents with caregiver   Review of Systems     Objective:   Physical Exam Neurovascular status intact with patient found to have plantar aspect right arch a small nodule measuring 3 x 3 mm that's painful in the mid arch area and has discoloration associated nailbeds healing well    Assessment:     Appears to be a small hemorrhagic hematoma which may have congealed or some other form assist with well-healing nail sites    Plan:     Reviewed consideration first dorsal injection directly into this area versus hot compresses therapy. Patient's can start with compress therapy and I did apply padding to take pressure off the lesion which she will use for the next couple weeks and if symptoms continue she will reappoint and may eventually need this to be taken out or other treatment

## 2017-01-14 DIAGNOSIS — J449 Chronic obstructive pulmonary disease, unspecified: Secondary | ICD-10-CM | POA: Diagnosis not present

## 2017-01-14 DIAGNOSIS — Z87891 Personal history of nicotine dependence: Secondary | ICD-10-CM | POA: Diagnosis not present

## 2017-01-14 DIAGNOSIS — Z7189 Other specified counseling: Secondary | ICD-10-CM | POA: Diagnosis not present

## 2017-01-20 DIAGNOSIS — H401132 Primary open-angle glaucoma, bilateral, moderate stage: Secondary | ICD-10-CM | POA: Diagnosis not present

## 2017-01-20 DIAGNOSIS — H1852 Epithelial (juvenile) corneal dystrophy: Secondary | ICD-10-CM | POA: Diagnosis not present

## 2017-01-20 DIAGNOSIS — H04123 Dry eye syndrome of bilateral lacrimal glands: Secondary | ICD-10-CM | POA: Diagnosis not present

## 2017-01-27 NOTE — Progress Notes (Signed)
Electrophysiology Office Note Date: 01/29/2017  ID:  RHAELYN Glover, DOB 20-Mar-1927, MRN [id number]  PCP: Michele Orn, MD Primary Cardiologist: [name] Electrophysiologist: Allred  CC: Pacemaker follow-up  [name] is a 83 y.o. female seen today for Dr [name].  She presents today for routine electrophysiology followup.  Since last being seen in our clinic, the patient reports doing poorly.  She has had an episode of shingles. She has continued eye pain. She also has fatigue, shortness of breath, and chest pain that has been progressive over the last 2 years. Her PCP signed a DNR order recently and she states she "can't wait".  She asks today if her PPM is keeping her alive. She has had intermittent LE edema and took Lasix this morning.   She also has very poor appetite and "nothing tastes good".  Device History: MDT dual chamber PPM implanted 2008 for SSS; gen change 2013   Past Medical History:  Diagnosis Date  . Adrenal adenoma    Neg endocrine workup  . Anxiety   . Bronchiectasis    bronch 10/2009 w/ stenotrophomonas sens to levaquin and bactrim  . Chronic headaches   . Colonic diverticular abscess 01/26/2013  . Diverticulitis 01/2013   Severe. Was hospitalized for this and she developed 100% a fib.  . Dry eyes   . Encounter for long-term (current) use of other medications   . Esophageal reflux   . Glaucoma   . Glaucoma   . H/O hematuria    Neg workup Dahlstedt  . History of diverticular abscess 2014  . Hyperlipidemia   . Hypertension   . Hyperthyroidism   . Lung cancer ([place]) 2007   hx stage 1 BAC/ RML. s/p wedge resection from RML, superior segment RLL resection s/p seed implants  . Multinodular goiter (nontoxic)   . Mycobacterial disease   . Osteoporosis   . Permanent atrial fibrillation ([name])   . Sciatica   . SSS (sick sinus syndrome) ([place])   . Tachycardia-bradycardia syndrome ([place]) 2008   sp PPM by Dr Leonia Reeves  . Thyrotoxicosis without mention of  goiter or other cause, without mention of thyrotoxic crisis or storm    Past Surgical History:  Procedure Laterality Date  . [place]  . CATARACT EXTRACTION, BILATERAL  04/2004 & 07/2004  . HERNIA REPAIR  1988  . LAMINECTOMY  11/1986  . PACEMAKER INSERTION  01/2007   (MDT) Pacemaker syndrome 04/2009, new PM 05/2012 - [facility]red  . PERMANENT PACEMAKER GENERATOR CHANGE N/A 05/28/2012   Procedure: PERMANENT PACEMAKER GENERATOR CHANGE;  Surgeon: Thompson Grayer, MD;  [facility] C[facility] LAB;  Service: Cardiovascular;  Laterality: N/A;  . WEDGE RESECTION  03/2006   For lung cancer. Right middle lobe and right lower lobe superior segment with seed implants, [name].    Current Outpatient Prescriptions  Medication Sig Dispense Refill  . apixaban (ELIQUIS) 2.5 MG TABS tablet Take 2.5 mg by mouth 2 (two) times daily.    . cycloSPORINE (RESTASIS) 0.05 % ophthalmic emulsion Place 1 drop into both eyes 2 (two) times daily.    Marland Kitchen diltiazem (CARDIZEM CD) 240 MG 24 hr capsule Take 240 mg by mouth daily.    . dorzolamide (TRUSOPT) 2 % ophthalmic solution Place 1 drop into both eyes 3 (three) times daily.    . furosemide (LASIX) 20 MG tablet Take 1 tablet by mouth 3 (three) times a week.    Marland Kitchen HYDROcodone-acetaminophen (NORCO/VICODIN) 5-325 MG per tablet Take 1 tablet by mouth every  8 (eight) hours as needed for pain.    Marland Kitchen latanoprost (XALATAN) 0.005 % ophthalmic solution Place 1 drop into both eyes at bedtime.     . metoprolol tartrate (LOPRESSOR) 50 MG tablet Take 50 mg by mouth 2 (two) times daily.    . Multiple Vitamins-Minerals (ICAPS AREDS FORMULA PO) Take 1 capsule by mouth daily. MACULAR DEGENERATION SUPPLEMENT    . PATADAY 0.2 % SOLN Apply to eye as directed.     . sertraline (ZOLOFT) 50 MG tablet Take 1 tablet by mouth daily.    Marland Kitchen zolpidem (AMBIEN) 10 MG tablet Take 10 mg by mouth at bedtime as needed for sleep.     No current facility-administered medications for this visit.     Allergies:    Patient has no known allergies.   Social History: Social History   Social History  . Marital status: Widowed    Spouse name: N/A  . Number of children: 4  . Years of education: N/A   Occupational History  . Homemaker    Social History Main Topics  . Smoking status: Former Smoker    Packs/day: 0.50    Years: 55.00    Types: Cigarettes    Quit date: 12/16/2003  . Smokeless tobacco: Never Used  . Alcohol use No  . Drug use: No  . Sexual activity: No   Other Topics Concern  . Not on file   Social History Narrative   Widowed.  Lives alone.   Lives in [name].   Ambulates independently.             Family History: Family History  Problem Relation Age of Onset  . Heart failure Father   . Heart disease Father   . Diabetes Mother   . Arthritis Mother   . Hypertension Sister   . Lung cancer Sister   . Ovarian cancer Sister   . CAD Sister   . Cancer Son        Tonsil     Review of Systems: All other systems reviewed and are otherwise negative except as noted above.   Physical Exam: VS:  BP (!) 134/94   Pulse 63   Ht 5' (1.524 m)   Wt 92 lb 12.8 oz (42.1 kg)   LMP  (LMP Unknown)   SpO2 98%   BMI 18.12 kg/m  , BMI Body mass index is 18.12 kg/m.  GEN- The patient is elderly and frail appearing, alert and oriented x 3 today.   HEENT: normocephalic, atraumatic; sclera clear, conjunctiva pink; hearing intact; oropharynx clear; neck supple  Lungs- Clear to ausculation bilaterally, normal work of breathing.  No wheezes, rales, rhonchi Heart- Regular rate and rhythm (paced) GI- soft, non-tender, non-distended, bowel sounds present  Extremities- no clubbing, cyanosis, or edema  MS- no significant deformity or atrophy Skin- warm and dry, no rash or lesion; PPM pocket well healed Psych- euthymic mood, full affect Neuro- strength and sensation are intact  PPM Interrogation- reviewed in detail today,  See PACEART report  EKG:  EKG is not ordered  today.  Recent Labs: 08/06/2016: BUN 23; Creat 1.02; Hemoglobin 13.6; Platelets 315; Potassium 3.8; Sodium 140   Wt Readings from Last 3 Encounters:  01/29/17 92 lb 12.8 oz (42.1 kg)  01/29/17 92 lb 9.6 oz (42 kg)  08/06/16 100 lb (45.4 kg)     Other studies Reviewed: Additional studies/ records that were reviewed today include: Dr [name] and Dr Jackalyn Lombard office notes  Assessment and Plan:  1.  Tachy/brady syndrome Normal PPM function See Pace Art report No changes today  2.  Permanent atrial fibrillation  V rates controlled by device interrogation today - pt is not device dependent Continue Eliquis for CHADS2VASC of  (2.'5mg'$  bid for age and weight) CBC, BMET followed by PCP   3.  HTN Stable No change required today  4.  End of life issues Dr [name] and I have discussed today that goal is quality Will discontinue Diltiazem today I have also discussed that her PPM is not keeping her alive as she has a reasonable escape, but that it is improving symptoms.    Current medicines are reviewed at length with the patient today.   The patient does not have concerns regarding her medicines.  The following changes were made today:  none  Labs/ tests ordered today include: none Orders Placed This Encounter  Procedures  . CUP PACEART INCLINIC DEVICE CHECK     Disposition:   Follow up with [facility] transmissions, Dr [name] as scheduled, me in 1 year     Signed, Chanetta Marshall, NP 01/29/2017 11:29 AM  [place] [address] [phone] (office) [phone] (fax)

## 2017-01-29 ENCOUNTER — Encounter: Payer: Self-pay | Admitting: Cardiology

## 2017-01-29 ENCOUNTER — Ambulatory Visit (INDEPENDENT_AMBULATORY_CARE_PROVIDER_SITE_OTHER): Payer: Medicare Other | Admitting: Cardiology

## 2017-01-29 ENCOUNTER — Ambulatory Visit (INDEPENDENT_AMBULATORY_CARE_PROVIDER_SITE_OTHER): Payer: Medicare Other | Admitting: Nurse Practitioner

## 2017-01-29 VITALS — BP 134/94 | HR 63 | Ht 60.0 in | Wt 92.8 lb

## 2017-01-29 VITALS — BP 134/94 | HR 63 | Ht 60.0 in | Wt 92.6 lb

## 2017-01-29 DIAGNOSIS — I495 Sick sinus syndrome: Secondary | ICD-10-CM | POA: Diagnosis not present

## 2017-01-29 DIAGNOSIS — E43 Unspecified severe protein-calorie malnutrition: Secondary | ICD-10-CM

## 2017-01-29 DIAGNOSIS — Z950 Presence of cardiac pacemaker: Secondary | ICD-10-CM

## 2017-01-29 DIAGNOSIS — I4821 Permanent atrial fibrillation: Secondary | ICD-10-CM

## 2017-01-29 DIAGNOSIS — I482 Chronic atrial fibrillation: Secondary | ICD-10-CM

## 2017-01-29 DIAGNOSIS — E7800 Pure hypercholesterolemia, unspecified: Secondary | ICD-10-CM

## 2017-01-29 DIAGNOSIS — I10 Essential (primary) hypertension: Secondary | ICD-10-CM

## 2017-01-29 LAB — CUP PACEART INCLINIC DEVICE CHECK
Date Time Interrogation Session: 20180516110631
Implantable Lead Implant Date: 20080505
Implantable Lead Location: 753859
Implantable Lead Model: 5076
MDC IDC LEAD IMPLANT DT: 20080505
MDC IDC LEAD LOCATION: 753860
MDC IDC PG IMPLANT DT: 20130912

## 2017-01-29 NOTE — Progress Notes (Signed)
[facility]. [address], [place]  [address] Phone: [phone] Fax:  [phone]  Date:  01/29/2017   ID:  [name], DOB September 16, 1927, MRN [id number]  PCP:  Lavone Orn, MD  Electrophysiologist: Dr. Rayann Heman  History of Present Illness: [name] is a 83 y.o. female with atrial fibrillation, previously paroxysmal on amiodarone, pacemaker for sick sinus syndrome here for followup. Pacemaker check demonstrated 100% atrial fibrillation.  Amiodarone was discontinued because of her permanent atrial fibrillation. Recent pacemaker check demonstrated only 100% atrial fibrillation.  She reports compliance with Eliquis.  No bleeding issues. She takes Lasix 3 times a week.  Does not have much of an appetite, no taste.  She also dealt with shingles that affected her eye. Thankfully they resolved.  Today she is very pleasant but frustrated with the way that she feels overall, she states that she was recently made DO NOT RESUSCITATE. She is in a sense waiting for death. Denies chest pain, no significant shortness of breath.  Wt Readings from Last 3 Encounters:  01/29/17 92 lb 12.8 oz (42.1 kg)  01/29/17 92 lb 9.6 oz (42 kg)  08/06/16 100 lb (45.4 kg)     Past Medical History:  Diagnosis Date  . Adrenal adenoma    Neg endocrine workup  . Anxiety   . Bronchiectasis    bronch 10/2009 w/ stenotrophomonas sens to levaquin and bactrim  . Chronic headaches   . Colonic diverticular abscess 01/26/2013  . Diverticulitis 01/2013   Severe. Was hospitalized for this and she developed 100% a fib.  . Dry eyes   . Encounter for long-term (current) use of other medications   . Esophageal reflux   . Glaucoma   . Glaucoma   . H/O hematuria    Neg workup Dahlstedt  . History of diverticular abscess 2014  . Hyperlipidemia   . Hypertension   . Hyperthyroidism   . Lung cancer ([place]) 2007   hx stage 1 BAC/ RML. s/p wedge resection from RML, superior segment RLL resection  s/p seed implants  . Multinodular goiter (nontoxic)   . Mycobacterial disease   . Osteoporosis   . Permanent atrial fibrillation ([place])   . Sciatica   . SSS (sick sinus syndrome) ([place])   . 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 Thyrotoxicosis without mention of goiter or other cause, without mention of thyrotoxic crisis or storm     Past Surgical History:  Procedure Laterality Date  . [place]  . CATARACT EXTRACTION, BILATERAL  04/2004 & 07/2004  . HERNIA REPAIR  1988  . LAMINECTOMY  11/1986  . PACEMAKER INSERTION  01/2007   (MDT) Pacemaker syndrome 04/2009, new PM 05/2012 - Allred  . PERMANENT PACEMAKER GENERATOR CHANGE N/A 05/28/2012   Procedure: PERMANENT PACEMAKER GENERATOR CHANGE;  Surgeon: Thompson Grayer, MD;  Location: [facility] CATH LAB;  Service: Cardiovascular;  Laterality: N/A;  . WEDGE RESECTION  03/2006   For lung cancer. Right middle lobe and right lower lobe superior segment with seed implants, [place].    Current Outpatient Prescriptions  Medication Sig Dispense Refill  . apixaban (ELIQUIS) 2.5 MG TABS tablet Take 2.5 mg by mouth 2 (two) times daily.    . cycloSPORINE (RESTASIS) 0.05 % ophthalmic emulsion Place 1 drop into both eyes 2 (two) times daily.    Marland Kitchen diltiazem (CARDIZEM CD) 240 MG 24 hr capsule Take 240 mg by mouth daily.    Marland Kitchen  dorzolamide (TRUSOPT) 2 % ophthalmic solution Place 1 drop into both eyes 3 (three) times daily.    . furosemide (LASIX) 20 MG tablet Take 1 tablet by mouth 3 (three) times a week.    Marland Kitchen HYDROcodone-acetaminophen (NORCO/VICODIN) 5-325 MG per tablet Take 1 tablet by mouth every 8 (eight) hours as needed for pain.    Marland Kitchen latanoprost (XALATAN) 0.005 % ophthalmic solution Place 1 drop into both eyes at bedtime.     . metoprolol tartrate (LOPRESSOR) 50 MG tablet Take 50 mg by mouth 2 (two) times daily.    . Multiple Vitamins-Minerals (ICAPS AREDS FORMULA PO) Take 1 capsule by mouth daily. MACULAR DEGENERATION SUPPLEMENT     . PATADAY 0.2 % SOLN Apply to eye as directed.     . sertraline (ZOLOFT) 50 MG tablet Take 1 tablet by mouth daily.    Marland Kitchen zolpidem (AMBIEN) 10 MG tablet Take 10 mg by mouth at bedtime as needed for sleep.     No current facility-administered medications for this visit.     Allergies:   No Known Allergies  Social History:  The patient  reports that she quit smoking about 13 years ago. Her smoking use included Cigarettes. She has a 27.50 pack-year smoking history. She has never used smokeless tobacco. She reports that she does not drink alcohol or use drugs.   ROS:  Please see the history of present illness.   Denies any chest pain, palpitations, syncope, bleeding, orthopnea, PND, strokelike symptoms.  Baseline dyspnea.Dry eyes.  PHYSICAL EXAM: VS:  BP (!) 134/94 (BP Location: Left Arm, Patient Position: Sitting, Cuff Size: Normal)   Pulse 63   Ht 5' (1.524 m)   Wt 92 lb 9.6 oz (42 kg)   LMP  (LMP Unknown)   SpO2 98%   BMI 18.08 kg/m   GEN: Thin, in no acute distress  HEENT: normal  Neck: no JVD, carotid bruits, or masses Cardiac: RRR; no murmurs, rubs, or gallops,no edema  Respiratory:  Mild upper lobe wheezes heard as was heard previously GI: soft, nontender, nondistended, + BS MS: no deformity or atrophy  Skin: warm and dry, no rash Neuro:  Alert and Oriented x 3, Strength and sensation are intact Psych: euthymic mood, full affect     EKG: EKG today 01/29/17-63 atrial fibrillation ventricular pacing personally viewed-02/08/16-demand pacemaker 76 atrial fibrillation underlying, right bundle branch block. 01/30/15-ventricular pacing noted, underlying the atrial fibrillation, occasional PVCs- Ventricular pacing. Permanent atrial fibrillation. Heart rate 76.     Creatinine 1.09 on 02/08/16  ASSESSMENT AND PLAN:  1. Permanent atrial fibrillation-off of amiodarone. Rate controlled. We will stop her diltiazem and just use metoprolol 50 mg twice a day. She was on diltiazem 240 once  a day. 2. Pacemaker-functioning well. [name] saw today as well. We reviewed pacemaker together. 3. Hypertension-currently well controlled. No changes made. 4. Chronic anticoagulation-  Eliquis 2.5 mg twice a day. (Age, weight).  Checking CBC and basic metabolic profile.  5. Protein calorie malnutrition -continue to encourage nutrition. She is not very enthusiastic given her lack of taste.  6. 6 month followup  Signed, Candee Furbish, MD [facility]  01/29/2017 11:29 AM

## 2017-01-29 NOTE — Addendum Note (Signed)
Addended by: Emmaline Life on: 01/29/2017 11:32 AM   Modules accepted: Orders

## 2017-01-29 NOTE — Patient Instructions (Addendum)
Medication Instructions:  STOP Diltiazem (Cardizem)   Labwork: None Ordered   Testing/Procedures: None Ordered   Follow-Up: Remote monitoring is used to monitor your Pacemaker  from home. This monitoring reduces the number of office visits required to check your device to one time per year. It allows [place] to keep an eye on the functioning of your device to ensure it is working properly. You are scheduled for a device check from home on 04/30/17. You may send your transmission at any time that day. If you have a wireless device, the transmission will be sent automatically. After your physician reviews your transmission, you will receive a postcard with your next transmission date.  Your physician wants you to follow-up in: 6 months with Dr. Marlou Porch.  You will receive a reminder letter in the mail two months in advance. If you don't receive a letter, please call our office to schedule the follow-up appointment.  Your physician wants you to follow-up in: 1 year with Chanetta Marshall, NP.  You will receive a reminder letter in the mail two months in advance. If you don't receive a letter, please call our office to schedule the follow-up appointment.   If you need a refill on your cardiac medications before your next appointment, please call your pharmacy.   Thank you for choosing [facility]! Christen Bame, RN [phone]

## 2017-02-11 ENCOUNTER — Emergency Department (HOSPITAL_COMMUNITY): Payer: Medicare Other

## 2017-02-11 ENCOUNTER — Emergency Department (HOSPITAL_COMMUNITY)
Admission: EM | Admit: 2017-02-11 | Discharge: 2017-02-11 | Disposition: A | Discharge status: Home / Self Care | Payer: Medicare Other | Attending: Emergency Medicine | Admitting: Emergency Medicine

## 2017-02-11 ENCOUNTER — Encounter (HOSPITAL_COMMUNITY): Payer: Self-pay | Admitting: Emergency Medicine

## 2017-02-11 DIAGNOSIS — Z87891 Personal history of nicotine dependence: Secondary | ICD-10-CM | POA: Diagnosis not present

## 2017-02-11 DIAGNOSIS — R072 Precordial pain: Secondary | ICD-10-CM | POA: Insufficient documentation

## 2017-02-11 DIAGNOSIS — I10 Essential (primary) hypertension: Secondary | ICD-10-CM | POA: Insufficient documentation

## 2017-02-11 DIAGNOSIS — R0789 Other chest pain: Secondary | ICD-10-CM | POA: Diagnosis present

## 2017-02-11 DIAGNOSIS — R911 Solitary pulmonary nodule: Secondary | ICD-10-CM

## 2017-02-11 DIAGNOSIS — J449 Chronic obstructive pulmonary disease, unspecified: Secondary | ICD-10-CM | POA: Insufficient documentation

## 2017-02-11 DIAGNOSIS — Z85118 Personal history of other malignant neoplasm of bronchus and lung: Secondary | ICD-10-CM | POA: Diagnosis not present

## 2017-02-11 DIAGNOSIS — Z950 Presence of cardiac pacemaker: Secondary | ICD-10-CM | POA: Diagnosis not present

## 2017-02-11 DIAGNOSIS — R079 Chest pain, unspecified: Secondary | ICD-10-CM | POA: Diagnosis not present

## 2017-02-11 LAB — CBC
HCT: 46.2 % — ABNORMAL HIGH (ref 36.0–46.0)
Hemoglobin: 14.6 g/dL (ref 12.0–15.0)
MCH: 28.3 pg (ref 26.0–34.0)
MCHC: 31.6 g/dL (ref 30.0–36.0)
MCV: 89.7 fL (ref 78.0–100.0)
Platelets: 275 10*3/uL (ref 150–400)
RBC: 5.15 MIL/uL — ABNORMAL HIGH (ref 3.87–5.11)
RDW: 14.6 % (ref 11.5–15.5)
WBC: 7.5 10*3/uL (ref 4.0–10.5)

## 2017-02-11 LAB — BASIC METABOLIC PANEL
Anion gap: 9 (ref 5–15)
BUN: 30 mg/dL — AB (ref 6–20)
CALCIUM: 9.6 mg/dL (ref 8.9–10.3)
CO2: 28 mmol/L (ref 22–32)
CREATININE: 1.09 mg/dL — AB (ref 0.44–1.00)
Chloride: 104 mmol/L (ref 101–111)
GFR calc Af Amer: 51 mL/min — ABNORMAL LOW (ref >60)
GFR, EST NON AFRICAN AMERICAN: 44 mL/min — AB (ref >60)
GLUCOSE: 127 mg/dL — AB (ref 65–99)
Potassium: 3.7 mmol/L (ref 3.5–5.1)
Sodium: 141 mmol/L (ref 135–145)

## 2017-02-11 LAB — I-STAT TROPONIN, ED
TROPONIN I, POC: 0.02 ng/mL (ref 0.00–0.08)
Troponin i, poc: 0 ng/mL (ref 0.00–0.08)

## 2017-02-11 MED ORDER — IOPAMIDOL (ISOVUE-300) INJECTION 61%
INTRAVENOUS | Status: AC
  Filled 2017-02-11: qty 75

## 2017-02-11 MED ORDER — IOPAMIDOL (ISOVUE-300) INJECTION 61%
75.0000 mL | Freq: Once | INTRAVENOUS | Status: AC | PRN
  Administered 2017-02-11: 75 mL via INTRAVENOUS

## 2017-02-11 NOTE — ED Triage Notes (Signed)
Pt reports HTN of 334D systolic since last night accompanied by chest pain and weakness. Pain decreased with vicoden. Also reports nausea earlier.

## 2017-02-11 NOTE — ED Notes (Signed)
Patient transported to CT 

## 2017-02-11 NOTE — ED Notes (Signed)
Patient transported to X-ray 

## 2017-02-11 NOTE — ED Provider Notes (Signed)
Emergency Department Provider Note   I have reviewed the triage vital signs and the nursing notes.   HISTORY  Chief Complaint Hypertension and Chest Pain   HPI [name] is a 83 y.o. female with PMH of adrenal adenoma, a-fib, HLD, HTN, and lung cancer s/p resection presents to the emergency department for evaluation of elevated blood pressures and central chest pressure that started last night. Family member states that she checks her blood pressure regularly and last night it was running very high. Her Cardiologist is Dr. Marlou Porch. He states that she was recently taken off of her diltiazem But continues with metoprolol. No other medication changes. Patient describes the chest discomfort as intermittent, central pressure. She states it felt like a bowling ball in her chest with no modifying factors. No radiation of symptoms. No fever, chills, or productive cough.   Past Medical History:  Diagnosis Date  . Adrenal adenoma    Neg endocrine workup  . Anxiety   . Bronchiectasis    bronch 10/2009 w/ stenotrophomonas sens to levaquin and bactrim  . Chronic headaches   . Colonic diverticular abscess 01/26/2013  . Diverticulitis 01/2013   Severe. Was hospitalized for this and she developed 100% a fib.  . Dry eyes   . Encounter for Shatima Zalar-term (current) use of other medications   . Esophageal reflux   . Glaucoma   . Glaucoma   . H/O hematuria    Neg workup Dahlstedt  . History of diverticular abscess 2014  . Hyperlipidemia   . Hypertension   . Hyperthyroidism   . Lung cancer ([place]) 2007   hx stage 1 BAC/ RML. s/p wedge resection from RML, superior segment RLL resection s/p seed implants  . Multinodular goiter (nontoxic)   . Mycobacterial disease   . Osteoporosis   . Permanent atrial fibrillation ([place])   . Sciatica   . SSS (sick sinus syndrome) ([place])   . Tachycardia-bradycardia syndrome ([place]) 2008   sp PPM by Dr Leonia Reeves  . Thyrotoxicosis without mention of goiter or other  cause, without mention of thyrotoxic crisis or storm     Patient Active Problem List   Diagnosis Date Noted  . Protein-calorie malnutrition, severe ([place]) 07/22/2014  . Leukocytosis, unspecified 01/27/2013  . Anemia 01/27/2013  . Colonic diverticular abscess 01/26/2013  . Hypokalemia 01/26/2013  . Hyperglycemia 01/26/2013  . Sepsis ([place]) 01/26/2013  . Hypertension 01/26/2013  . Hyperlipidemia 01/26/2013  . Cystic lesion left ovary 01/26/2013  . Tachycardia-bradycardia ([place]) 05/21/2012  . Pacemaker-Medtronic 05/21/2012  . COPD (chronic obstructive pulmonary disease) with chronic bronchitis ([place]) 12/06/2008  . CHEST PAIN UNSPECIFIED 12/23/2007  . Pulmonary diseases due to other mycobacteria 09/20/2007  . ADENOCARCINOMA, LUNG 05/21/2007  . FIBRILLATION, ATRIAL 05/21/2007  . BRONCHIECTASIS 05/21/2007    Past Surgical History:  Procedure Laterality Date  . [place]  . CATARACT EXTRACTION, BILATERAL  04/2004 & 07/2004  . HERNIA REPAIR  1988  . LAMINECTOMY  11/1986  . PACEMAKER INSERTION  01/2007   (MDT) Pacemaker syndrome 04/2009, new PM 05/2012 - [facility]  . PERMANENT PACEMAKER GENERATOR CHANGE N/A 05/28/2012   Procedure: PERMANENT PACEMAKER GENERATOR CHANGE;  Surgeon: Thompson Grayer, MD;  Location: [facility] CATH LAB;  Service: Cardiovascular;  Laterality: N/A;  . WEDGE RESECTION  03/2006   For lung cancer. Right middle lobe and right lower lobe superior segment with seed implants, [place].    Current Outpatient Rx  . Order #: [id number] Class: Historical Med  . Order #: [id number] Class: Historical Med  .  Order #: [id number] Class: Historical Med  . Order #: [id number] Class: Historical Med  . Order #: [id number] Class: Historical Med  . Order #: [id number] Class: Historical Med  . Order #: [id number] Class: Historical Med  . Order #: [id number] Class: Historical Med  . Order #: [id number] Class: Historical Med  . Order #: [id number] Class: Historical Med  . Order #: [id number] Class: Historical Med  .  Order #: [id number] Class: Historical Med    Allergies Patient has no known allergies.  Family History  Problem Relation Age of Onset  . Heart failure Father   . Heart disease Father   . Diabetes Mother   . Arthritis Mother   . Hypertension Sister   . Lung cancer Sister   . Ovarian cancer Sister   . CAD Sister   . Cancer Son        [place]    Social History Social History  Substance Use Topics  . Smoking status: Former Smoker    Packs/day: 0.50    Years: 55.00    Types: Cigarettes    Quit date: 12/16/2003  . Smokeless tobacco: Never Used  . Alcohol use No    Review of Systems  Constitutional: No fever/chills Eyes: No visual changes. ENT: No sore throat. Cardiovascular: Positive chest pain. Respiratory: Denies shortness of breath. Gastrointestinal: No abdominal pain.  No nausea, no vomiting.  No diarrhea.  No constipation. Genitourinary: Negative for dysuria. Musculoskeletal: Negative for back pain. Skin: Negative for rash. Neurological: Negative for headaches, focal weakness or numbness.  10-point ROS otherwise negative.  ____________________________________________   PHYSICAL EXAM:  VITAL SIGNS: ED Triage Vitals [02/11/17 1051]  Enc Vitals Group     BP (!) 176/114     Pulse Rate 92     Resp 20     Temp 97.5 F (36.4 C)     Temp Source Oral     SpO2 97 %     Pain Score 5   Constitutional: Alert and oriented. Well appearing and in no acute distress. Hard of hearing.  Eyes: Conjunctivae are normal.  Head: Atraumatic. Nose: No congestion/rhinnorhea. Mouth/Throat: Mucous membranes are moist.  Oropharynx non-erythematous. Neck: No stridor.  Cardiovascular: Normal rate, regular rhythm. Good peripheral circulation. Grossly normal heart sounds.   Respiratory: Normal respiratory effort.  No retractions. Lungs CTAB. Gastrointestinal: Soft and nontender. No distention.  Musculoskeletal: No lower extremity tenderness nor edema. No gross deformities of  extremities. Neurologic:  Normal speech and language. No gross focal neurologic deficits are appreciated.  Skin:  Skin is warm, dry and intact. No rash noted.  ____________________________________________   LABS (all labs ordered are listed, but only abnormal results are displayed)  Labs Reviewed  BASIC METABOLIC PANEL - Abnormal; Notable for the following:       Result Value   Glucose, Bld 127 (*)    BUN 30 (*)    Creatinine, Ser 1.09 (*)    GFR calc non Af Amer 44 (*)    GFR calc Af Amer 51 (*)    All other components within normal limits  CBC - Abnormal; Notable for the following:    RBC 5.15 (*)    HCT 46.2 (*)    All other components within normal limits  I-STAT TROPOININ, ED  I-STAT TROPOININ, ED   ____________________________________________  EKG   EKG Interpretation  Date/Time:  Tuesday Feb 11 2017 11:00:30 EDT Ventricular Rate:  82 PR Interval:    QRS Duration: 131 QT Interval:  394 QTC Calculation: 461 R Axis:   94 Text Interpretation:  Atrial fibrillation RBBB and LPFB Borderline ST depression, lateral leads Significant artifact. Will repeat.  Confirmed by Nanda Quinton [phone]) on 02/11/2017 11:04:20 AM       ____________________________________________  RADIOLOGY  Dg Chest 2 View  Result Date: 02/11/2017 CLINICAL DATA:  Chest pain. EXAM: CHEST  2 VIEW COMPARISON:  Radiographs of October 13, 2015. FINDINGS: Stable cardiomegaly. Atherosclerosis of thoracic aorta is noted. Left-sided pacemaker is unchanged in position. No pneumothorax is noted. Left lung is clear. Increased right lower lobe opacity is noted concerning for pneumonia or atelectasis or possibly mass. Postsurgical changes are noted in this area. Bony thorax is unremarkable. IMPRESSION: Aortic atherosclerosis. Increased right lower lobe opacity is noted which may represent pneumonia or atelectasis, but mass cannot be excluded. CT scan of the chest is recommended for further evaluation.  Electronically Signed   By: Marijo Conception, M.D.   On: 02/11/2017 11:45   Ct Chest W Contrast  Result Date: 02/11/2017 CLINICAL DATA:  Increased right lower lobe opacity on chest radiographs obtained earlier today. CT was recommended to exclude a mass. Status post wedge resection for right middle lobe lung carcinoma in 2007 with radiation seed implants. EXAM: CT CHEST WITH CONTRAST TECHNIQUE: Multidetector CT imaging of the chest was performed during intravenous contrast administration. CONTRAST:  61mL ISOVUE-300 IOPAMIDOL (ISOVUE-300) INJECTION 61% COMPARISON:  Current and previous chest radiographs and chest CT dated 11/02/2013. FINDINGS: Cardiovascular: Progressive enlargement of the heart due to biatrial enlargement. Atheromatous arterial calcifications, including the aorta and coronary arteries, without significant change. Mediastinum/Nodes: Mildly enlarged precarinal lymph node with a short axis diameter of 9 mm on image number 54 of series 2, previously 7 mm. Interval mildly enlarged right anterior paratracheal lymph node with a short axis diameter of 9 mm on image number 49 of series 2, previously 4 mm. Stable mildly enlarged right lobe thyroid gland containing multiple small calcified nodules. The left lobe is largely obscured by streak artifact from the patient's left subclavian pacemaker battery pack. There is at least 1 small calcified left thyroid nodule Lungs/Pleura: Hyperexpanded lungs with diffuse peribronchial thickening and mild bilateral bullous changes and minimally prominent interstitial markings with mild progression. Left upper lobe and lingular cylindrical bronchiectasis with associated parenchymal density with mild progression. And adjacent nodule with a maximum diameter of 6 mm on image number 64 of series 5 is unchanged. Interval right lower lobe nodule in an area of previously demonstrated linear density. This nodule measures 18 x 11 mm on image number 94 of series 5. There is  associated branching linear calcification laterally. No significant change in hilar and perihilar density and radiation seed implants on the right. There are areas of cylindrical bronchiectasis in the adjacent and nearby portions of the right lung with some associated parenchymal density with mild progression. Upper Abdomen: The previously demonstrated 3.9 cm mid to upper left renal cyst currently measures 4.5 cm in maximum diameter on image number 136 of series 2. Musculoskeletal: Thoracic and cervical spine degenerative changes. IMPRESSION: 1. Interval 18 x 11 mm right lower lobe nodule. This could represent recurrent malignancy or metastatic disease or a rounded area of atelectasis. These possibilities could be differentiated with a PET-CT. 2. Interval minimal mediastinal adenopathy. This could be reactive or metastatic. 3. Mildly progressive changes of COPD and chronic bronchitis with areas of bronchiectasis and associated parenchymal densities bilaterally. 4. Small calcified nodules in the thyroid gland. These are too small and poorly visualized to adequately characterize, but most likely benign in the absence  of known clinical risk factors for thyroid carcinoma. 5. Coronary artery atherosclerosis and aortic atherosclerosis. 6. Progressive cardiomegaly due to biatrial enlargement. Electronically Signed   By: Claudie Revering M.D.   On: 02/11/2017 14:04    ____________________________________________   PROCEDURES  Procedure(s) performed:   Procedures  None ____________________________________________   INITIAL IMPRESSION / ASSESSMENT AND PLAN / ED COURSE  Pertinent labs & imaging results that were available during my care of the patient were reviewed by me and considered in my medical decision making (see chart for details).  Patient resents to the emergency department for evaluation of intermittent chest pressure starting last night. No dyspnea, fever, productive cough. EKG initially with  significant artifact. Repeat shows no evidence of acute ischemia. Plan for ACS evaluation. CXR from triage shows right lower lobe opacity which could be pneumonia versus atelectasis. Radiology recommends follow-up with CT of the chest to further evaluate.   01:00 PM Discussed admission with the patient for chest pain r/o. Patient reports that she is a DNR and would prefer to be discharged for outpatient Cardiology follow up. I discussed my concerns regarding a possible AMI with the patient and daughter in detail. They verbalize understanding of this concern but would like to return home. They will stay for repeat troponin and CT chest.   Repeat troponin negative. No active chest pain. Discussed HTN mgmt with PCP. Also discussed CT results and pulmonary nodule with family at bedside. Doubtful that they will pursue further investigation but will discuss with PCP. Also plans to call Cardiology tomorrow for outpatient appointment.   ___________________________________  FINAL CLINICAL IMPRESSION(S) / ED DIAGNOSES  Final diagnoses:  Precordial chest pain  Lung nodule     MEDICATIONS GIVEN DURING THIS VISIT:  Medications  iopamidol (ISOVUE-300) 61 % injection (  Canceled Entry 02/11/17 1347)  iopamidol (ISOVUE-300) 61 % injection 75 mL (75 mLs Intravenous Contrast Given 02/11/17 1323)     NEW OUTPATIENT MEDICATIONS STARTED DURING THIS VISIT:  None   Note:  This document was prepared using Dragon voice recognition software and may include unintentional dictation errors.  Nanda Quinton, MD Emergency Medicine   Lorian Yaun, [facility], MD 02/11/17 Einar Crow

## 2017-02-11 NOTE — ED Notes (Signed)
Bed: WA20 Expected date:  Expected time:  Means of arrival:  Comments: TR1

## 2017-02-11 NOTE — Discharge Instructions (Signed)
You were seen in the ED today with chest pain. We performed multiple tests which were normal but we do want you to follow up with both your primary doctor and the cardiologist. Continue to check your blood pressure once a day at the same time and keep a log. Return to the ED with any new or worsening chest pain.   You do have a new lung nodule that could be cancer. You should discuss this with your PCP to decide on next steps.

## 2017-02-11 NOTE — ED Notes (Signed)
ED Provider at bedside. 

## 2017-02-12 ENCOUNTER — Telehealth: Payer: Self-pay | Admitting: Cardiology

## 2017-02-12 NOTE — Telephone Encounter (Signed)
Please call pt back to schedule a f/u appt.  Can be with Dr Marlou Porch or an APP at their convenience.

## 2017-02-12 NOTE — Telephone Encounter (Signed)
Patient daughter [name]) calling, patient was in hospital on 02-11-17 and was told to make an appt with Dr. Marlou Porch. I offered the opening for Dr. Marlou Porch but [name] states that patient couldn't make the 8:00am.Please call to discuss, thanks.

## 2017-02-13 DIAGNOSIS — G8929 Other chronic pain: Secondary | ICD-10-CM | POA: Diagnosis not present

## 2017-02-13 DIAGNOSIS — I10 Essential (primary) hypertension: Secondary | ICD-10-CM | POA: Diagnosis not present

## 2017-02-13 NOTE — Telephone Encounter (Signed)
Called and spoke with daughter who states all she needed was to schedule the f/u appt.  appt scheduled for 6/4 at 2:20.

## 2017-02-17 ENCOUNTER — Ambulatory Visit (INDEPENDENT_AMBULATORY_CARE_PROVIDER_SITE_OTHER): Payer: Medicare Other | Admitting: Cardiology

## 2017-02-17 VITALS — BP 142/90 | HR 82 | Ht 60.0 in | Wt 93.2 lb

## 2017-02-17 DIAGNOSIS — Z950 Presence of cardiac pacemaker: Secondary | ICD-10-CM

## 2017-02-17 DIAGNOSIS — I482 Chronic atrial fibrillation: Secondary | ICD-10-CM

## 2017-02-17 DIAGNOSIS — I10 Essential (primary) hypertension: Secondary | ICD-10-CM | POA: Diagnosis not present

## 2017-02-17 DIAGNOSIS — I4821 Permanent atrial fibrillation: Secondary | ICD-10-CM

## 2017-02-17 NOTE — Patient Instructions (Addendum)
Medication Instructions:  Please take Furosemide every day for 1 week then return to every other day. Continue all other medications as listed.  Follow-Up: Follow up in 6 months with Dr. Marlou Porch.  You will receive a letter in the mail 2 months before you are due.  Please call us when you receive this letter to schedule your follow up appointment.  If you need a refill on your cardiac medications before your next appointment, please call your pharmacy.  Thank you for choosing [facility]!!

## 2017-02-17 NOTE — Progress Notes (Signed)
[facility] [address], [place]  [address] Phone: [phone] Fax:  [phone]  Date:  02/17/2017   ID:  SEANNE CHIRICO, DOB 1927-09-09, MRN [id number]  PCP:  Lavone Orn, MD  Electrophysiologist: Dr. Rayann Heman  History of Present Illness: [name] is a 83 y.o. female with permanent atrial fibrillation, previously  on amiodarone, pacemaker for sick sinus syndrome here for followup after being in emergency department for hypertension. Pacemaker check demonstrated 100% atrial fibrillation.  Her daughter took her to the emergency room on 02/11/17 after she took her blood pressure and it was in the 200 range. She was nervous, had some chest discomfort. Troponins were normal. CT scan was normal.  I previously discontinued her diltiazem to hopefully help simple bladder medication regimen however this likely contributed to her hypertension. She is now back on diltiazem 240 mg one today.  She takes Lasix 3 times a week.  Does not have much of an appetite, no taste.  In general frustrated with the way that she feels overall, she is DO NOT RESUSCITATE. She is in a sense waiting for death she states. Denies chest pain, no significant shortness of breath currently  Wt Readings from Last 3 Encounters:  02/17/17 93 lb 3.2 oz (42.3 kg)  01/29/17 92 lb 12.8 oz (42.1 kg)  01/29/17 92 lb 9.6 oz (42 kg)     Past Medical History:  Diagnosis Date  . Adrenal adenoma    Neg endocrine workup  . Anxiety   . Bronchiectasis    bronch 10/2009 w/ stenotrophomonas sens to levaquin and bactrim  . Chronic headaches   . Colonic diverticular abscess 01/26/2013  . Diverticulitis 01/2013   Severe. Was hospitalized for this and she developed 100% a fib.  . Dry eyes   . Encounter for long-term (current) use of other medications   . Esophageal reflux   . Glaucoma   . Glaucoma   . H/O hematuria    Neg workup Dahlstedt  . History of diverticular abscess 2014  . Hyperlipidemia   .  Hypertension   . Hyperthyroidism   . Lung cancer ([place]) 2007   hx stage 1 BAC/ RML. s/p wedge resection from RML, superior segment RLL resection s/p seed implants  . Multinodular goiter (nontoxic)   . Mycobacterial disease   . Osteoporosis   . Permanent atrial fibrillation ([place])   . Sciatica   . SSS (sick sinus syndrome) ([place])   . Tachycardia-bradycardia syndrome ([place]) 2008   sp PPM by Dr Leonia Reeves  . Thyrotoxicosis without mention of goiter or other cause, without mention of thyrotoxic crisis or storm     Past Surgical History:  Procedure Laterality Date  . [place]  . CATARACT EXTRACTION, BILATERAL  04/2004 & 07/2004  . HERNIA REPAIR  1988  . LAMINECTOMY  11/1986  . PACEMAKER INSERTION  01/2007   (MDT) Pacemaker syndrome 04/2009, new PM 05/2012 - [facility]  . PERMANENT PACEMAKER GENERATOR CHANGE N/A 05/28/2012   Procedure: PERMANENT PACEMAKER GENERATOR CHANGE;  Surgeon: Thompson Grayer, MD;  Location: [facility] CATH LAB;  Service: Cardiovascular;  Laterality: N/A;  . WEDGE RESECTION  03/2006   For lung cancer. Right middle lobe and right lower lobe superior segment with seed implants, [place].    Current Outpatient Prescriptions  Medication Sig Dispense Refill  . albuterol (PROVENTIL HFA;VENTOLIN HFA) 108 (90 Base) MCG/ACT inhaler Inhale 1-2 puffs into the lungs every 6 (six) hours as needed for wheezing or  shortness of breath.    Marland Kitchen apixaban (ELIQUIS) 2.5 MG TABS tablet Take 2.5 mg by mouth 2 (two) times daily.    . cycloSPORINE (RESTASIS) 0.05 % ophthalmic emulsion Place 1 drop into both eyes 2 (two) times daily.    Marland Kitchen DILT-XR 240 MG 24 hr capsule Take 240 mg by mouth daily.  11  . dorzolamide (TRUSOPT) 2 % ophthalmic solution Place 1 drop into both eyes 3 (three) times daily.    . furosemide (LASIX) 20 MG tablet Take 1 tablet by mouth every Monday, Wednesday, and Friday.     Marland Kitchen HYDROcodone-acetaminophen (NORCO) 7.5-325 MG tablet Take 1 tablet by mouth every 6 (six) hours.  0  . latanoprost  (XALATAN) 0.005 % ophthalmic solution Place 1 drop into both eyes at bedtime.     . metoprolol tartrate (LOPRESSOR) 50 MG tablet Take 50 mg by mouth 2 (two) times daily.    . Multiple Vitamin (MULTIVITAMIN WITH MINERALS) TABS tablet Take 1 tablet by mouth every Monday, Wednesday, and Friday.    . Multiple Vitamins-Minerals (ICAPS AREDS FORMULA PO) Take 1 capsule by mouth daily. MACULAR DEGENERATION SUPPLEMENT    . PATADAY 0.2 % SOLN Place 1 drop into both ears 2 (two) times daily.     . sertraline (ZOLOFT) 50 MG tablet Take 1 tablet by mouth daily with breakfast.      No current facility-administered medications for this visit.     Allergies:   No Known Allergies  Social History:  The patient  reports that she quit smoking about 13 years ago. Her smoking use included Cigarettes. She has a 27.50 pack-year smoking history. She has never used smokeless tobacco. She reports that she does not drink alcohol or use drugs.   ROS:  Positive for cough, chronic, mucus production, no syncope  PHYSICAL EXAM: VS:  BP (!) 142/90   Pulse 82   Ht 5' (1.524 m)   Wt 93 lb 3.2 oz (42.3 kg)   LMP  (LMP Unknown)   SpO2 93%   BMI 18.20 kg/m   GEN: Thin, pleasant  HEENT: normal  Neck: no JVD, carotid bruits, or masses Cardiac: Regular rate and rhythm; no murmurs, rubs, or gallops,no edema  Respiratory:  Rhonchorous breath sounds bilaterally GI: soft, nontender, nondistended, + BS MS: no deformity or atrophy  Skin: warm and dry, no rash Neuro:  Alert and Oriented x 3, Strength and sensation are intact Psych: euthymic mood, full affect     EKG: EKG 01/29/17-63 atrial fibrillation ventricular pacing personally viewed-02/08/16-demand pacemaker 76 atrial fibrillation underlying, right bundle branch block. 01/30/15-ventricular pacing noted, underlying the atrial fibrillation, occasional PVCs- Ventricular pacing. Permanent atrial fibrillation. Heart rate 76.     Creatinine normal  ASSESSMENT AND  PLAN:  1. Permanent atrial fibrillation-off of amiodarone. Rate controlled. We previously tried to stop her diltiazem however her blood pressure increased significantly. She is now back on. Continue metoprolol 50 mg twice a day. She was on diltiazem 240 once a day.   2. Pacemaker-functioning well. Amber saw today as well. We reviewed pacemaker together at previous visit 3. Hypertension-currently well controlled.  Recently restarted diltiazem about 5 days ago. I think be reasonable to check her blood pressures at home may be daily but not to overdo this. This could cause more anxiety. 4. Chronic anticoagulation-  Eliquis 2.5 mg twice a day. (Age, weight).  No changes  5. Protein calorie malnutrition -continue to encourage nutrition. She is not very enthusiastic given her lack of taste.  6. Chronic cough-bronchitis, mucus production, Dr. Laurann Montana has seen her today. 7. 6 month followup  Signed, Candee Furbish, MD [facility]  02/17/2017 3:17 PM

## 2017-04-28 DIAGNOSIS — R1314 Dysphagia, pharyngoesophageal phase: Secondary | ICD-10-CM | POA: Diagnosis not present

## 2017-04-30 ENCOUNTER — Other Ambulatory Visit: Payer: Self-pay | Admitting: Gastroenterology

## 2017-04-30 DIAGNOSIS — R1310 Dysphagia, unspecified: Secondary | ICD-10-CM

## 2017-04-30 DIAGNOSIS — J449 Chronic obstructive pulmonary disease, unspecified: Secondary | ICD-10-CM | POA: Diagnosis not present

## 2017-04-30 DIAGNOSIS — I480 Paroxysmal atrial fibrillation: Secondary | ICD-10-CM | POA: Diagnosis not present

## 2017-04-30 DIAGNOSIS — R634 Abnormal weight loss: Secondary | ICD-10-CM | POA: Diagnosis not present

## 2017-04-30 DIAGNOSIS — J479 Bronchiectasis, uncomplicated: Secondary | ICD-10-CM | POA: Diagnosis not present

## 2017-04-30 DIAGNOSIS — C3490 Malignant neoplasm of unspecified part of unspecified bronchus or lung: Secondary | ICD-10-CM | POA: Diagnosis not present

## 2017-04-30 DIAGNOSIS — A310 Pulmonary mycobacterial infection: Secondary | ICD-10-CM | POA: Diagnosis not present

## 2017-04-30 DIAGNOSIS — Z950 Presence of cardiac pacemaker: Secondary | ICD-10-CM | POA: Diagnosis not present

## 2017-05-02 ENCOUNTER — Ambulatory Visit
Admission: RE | Admit: 2017-05-02 | Discharge: 2017-05-02 | Disposition: A | Discharge status: Home / Self Care | Payer: Medicare Other | Source: Ambulatory Visit | Attending: Gastroenterology | Admitting: Gastroenterology

## 2017-05-02 DIAGNOSIS — R1310 Dysphagia, unspecified: Secondary | ICD-10-CM

## 2017-05-02 DIAGNOSIS — K219 Gastro-esophageal reflux disease without esophagitis: Secondary | ICD-10-CM | POA: Diagnosis not present

## 2017-06-10 DIAGNOSIS — H04123 Dry eye syndrome of bilateral lacrimal glands: Secondary | ICD-10-CM | POA: Diagnosis not present

## 2017-06-10 DIAGNOSIS — H401132 Primary open-angle glaucoma, bilateral, moderate stage: Secondary | ICD-10-CM | POA: Diagnosis not present

## 2017-06-10 DIAGNOSIS — H353132 Nonexudative age-related macular degeneration, bilateral, intermediate dry stage: Secondary | ICD-10-CM | POA: Diagnosis not present

## 2017-06-10 DIAGNOSIS — Z961 Presence of intraocular lens: Secondary | ICD-10-CM | POA: Diagnosis not present

## 2017-06-20 DIAGNOSIS — E43 Unspecified severe protein-calorie malnutrition: Secondary | ICD-10-CM | POA: Diagnosis not present

## 2017-06-20 DIAGNOSIS — Z681 Body mass index (BMI) 19 or less, adult: Secondary | ICD-10-CM | POA: Diagnosis not present

## 2017-06-20 DIAGNOSIS — Z23 Encounter for immunization: Secondary | ICD-10-CM | POA: Diagnosis not present

## 2017-06-20 DIAGNOSIS — I10 Essential (primary) hypertension: Secondary | ICD-10-CM | POA: Diagnosis not present

## 2017-06-20 DIAGNOSIS — G8929 Other chronic pain: Secondary | ICD-10-CM | POA: Diagnosis not present

## 2017-06-20 DIAGNOSIS — R1310 Dysphagia, unspecified: Secondary | ICD-10-CM | POA: Diagnosis not present

## 2017-08-31 ENCOUNTER — Other Ambulatory Visit: Payer: Self-pay | Admitting: Cardiology

## 2017-09-01 DIAGNOSIS — R1314 Dysphagia, pharyngoesophageal phase: Secondary | ICD-10-CM | POA: Diagnosis not present

## 2017-09-01 DIAGNOSIS — E43 Unspecified severe protein-calorie malnutrition: Secondary | ICD-10-CM | POA: Diagnosis not present

## 2017-09-01 DIAGNOSIS — K117 Disturbances of salivary secretion: Secondary | ICD-10-CM | POA: Diagnosis not present

## 2017-11-24 DIAGNOSIS — Z0000 Encounter for general adult medical examination without abnormal findings: Secondary | ICD-10-CM | POA: Diagnosis not present

## 2017-11-24 DIAGNOSIS — Z66 Do not resuscitate: Secondary | ICD-10-CM | POA: Diagnosis not present

## 2017-11-24 DIAGNOSIS — E43 Unspecified severe protein-calorie malnutrition: Secondary | ICD-10-CM | POA: Diagnosis not present

## 2017-11-24 DIAGNOSIS — M150 Primary generalized (osteo)arthritis: Secondary | ICD-10-CM | POA: Diagnosis not present

## 2017-11-24 DIAGNOSIS — I480 Paroxysmal atrial fibrillation: Secondary | ICD-10-CM | POA: Diagnosis not present

## 2017-11-24 DIAGNOSIS — Z1389 Encounter for screening for other disorder: Secondary | ICD-10-CM | POA: Diagnosis not present

## 2017-11-24 DIAGNOSIS — J449 Chronic obstructive pulmonary disease, unspecified: Secondary | ICD-10-CM | POA: Diagnosis not present

## 2017-11-24 DIAGNOSIS — I5030 Unspecified diastolic (congestive) heart failure: Secondary | ICD-10-CM | POA: Diagnosis not present

## 2017-11-24 DIAGNOSIS — R1313 Dysphagia, pharyngeal phase: Secondary | ICD-10-CM | POA: Diagnosis not present

## 2017-11-24 DIAGNOSIS — J479 Bronchiectasis, uncomplicated: Secondary | ICD-10-CM | POA: Diagnosis not present

## 2017-12-02 DIAGNOSIS — M3501 Sicca syndrome with keratoconjunctivitis: Secondary | ICD-10-CM | POA: Diagnosis not present

## 2017-12-02 DIAGNOSIS — H1013 Acute atopic conjunctivitis, bilateral: Secondary | ICD-10-CM | POA: Diagnosis not present

## 2017-12-02 DIAGNOSIS — H04123 Dry eye syndrome of bilateral lacrimal glands: Secondary | ICD-10-CM | POA: Diagnosis not present

## 2017-12-02 DIAGNOSIS — H401132 Primary open-angle glaucoma, bilateral, moderate stage: Secondary | ICD-10-CM | POA: Diagnosis not present

## 2018-03-25 NOTE — Progress Notes (Signed)
Cardiology Office Note Date:  03/26/2018  Patient ID:  [name] 1927/09/11, MRN [id number] PCP:  Lavone Orn, MD  Cardiologist:  Dr. Marlou Porch Electrophysiologist: Dr. Rayann Heman   Chief Complaint: annual EP visit  History of Present Illness: [name] is a 84 y.o. female with history of AFib (permanent), glaucoma, GERD, HTN, HLD, SSSx w/PPM, and chronic bronchitis.  She comes in today to be seen for Dr. Rayann Heman.  She last saw EP service with Genene Churn, NP in May 2018.  At that time, the patient voiced readiness for EOL, was a DNR.  Chronic sounding CP, SOB.  This was also voiced to Dr. Marlou Porch at his visit last year as well.  Her dilt was stopped in effort to simplify her regime though ultimately restarted with HTN ooc.  She comes today accompanied by her daughter who lives 3 doors down from her.  The patient lives in her own apartment, takes care of it with help only withcooking for the most part.  She denies any CP, plapitations or SOB.  She mentions though that she lives a terrible existence with significant trouble with eating.  Her daughter explains there is an esophageal motility problem and is hard for her to both swallow and keep food down, with significant regurgitation.  She has a terrible time with maintaining weight and says she just always feels "yucky" or "blah".  She denies any bleeding or signs of bleeding   Device information: MDT dual chamber PPM (programmed VVIR), implanted 05/28/12   Past Medical History:  Diagnosis Date  . Adrenal adenoma    Neg endocrine workup  . Anxiety   . Bronchiectasis    bronch 10/2009 w/ stenotrophomonas sens to levaquin and bactrim  . Chronic headaches   . Colonic diverticular abscess 01/26/2013  . Diverticulitis 01/2013   Severe. Was hospitalized for this and she developed 100% a fib.  . Dry eyes   . Encounter for long-term (current) use of other medications   . Esophageal reflux   . Glaucoma   . Glaucoma   . H/O  hematuria    Neg workup Dahlstedt  . History of diverticular abscess 2014  . Hyperlipidemia   . Hypertension   . Hyperthyroidism   . Lung cancer ([place]) 2007   hx stage 1 BAC/ RML. s/p wedge resection from RML, superior segment RLL resection s/p seed implants  . Multinodular goiter (nontoxic)   . Mycobacterial disease   . Osteoporosis   . Permanent atrial fibrillation ([place])   . Sciatica   . SSS (sick sinus syndrome) ([place])   . Tachycardia-bradycardia syndrome ([place]) 2008   sp PPM by Dr Leonia Reeves  . Thyrotoxicosis without mention of goiter or other cause, without mention of thyrotoxic crisis or storm     Past Surgical History:  Procedure Laterality Date  . [place]  . CATARACT EXTRACTION, BILATERAL  04/2004 & 07/2004  . HERNIA REPAIR  1988  . LAMINECTOMY  11/1986  . PACEMAKER INSERTION  01/2007   (MDT) Pacemaker syndrome 04/2009, new PM 05/2012 - [facility]  . PERMANENT PACEMAKER GENERATOR CHANGE N/A 05/28/2012   Procedure: PERMANENT PACEMAKER GENERATOR CHANGE;  Surgeon: Thompson Grayer, MD;  Location: [facility] CATH LAB;  Service: Cardiovascular;  Laterality: N/A;  . WEDGE RESECTION  03/2006   For lung cancer. Right middle lobe and right lower lobe superior segment with seed implants, [place].    Current Outpatient Medications  Medication Sig Dispense Refill  . albuterol (PROVENTIL HFA;VENTOLIN HFA) 108 (  90 Base) MCG/ACT inhaler Inhale 1-2 puffs into the lungs every 6 (six) hours as needed for wheezing or shortness of breath.    Marland Kitchen apixaban (ELIQUIS) 2.5 MG TABS tablet Take 2.5 mg by mouth 2 (two) times daily.    . cycloSPORINE (RESTASIS) 0.05 % ophthalmic emulsion Place 1 drop into both eyes 2 (two) times daily.    Marland Kitchen DILT-XR 240 MG 24 hr capsule Take 240 mg by mouth daily.  11  . dorzolamide (TRUSOPT) 2 % ophthalmic solution Place 1 drop into both eyes 3 (three) times daily.    Marland Kitchen HYDROcodone-acetaminophen (NORCO) 7.5-325 MG tablet Take 1 tablet by mouth every 6 (six) hours.  0  . latanoprost  (XALATAN) 0.005 % ophthalmic solution Place 1 drop into both eyes at bedtime.     . metoprolol tartrate (LOPRESSOR) 50 MG tablet TAKE 1 TABLET TWICE A DAY 180 tablet 1  . Multiple Vitamin (MULTIVITAMIN WITH MINERALS) TABS tablet Take 1 tablet by mouth every Monday, Wednesday, and Friday.    . Multiple Vitamins-Minerals (ICAPS AREDS FORMULA PO) Take 1 capsule by mouth daily. MACULAR DEGENERATION SUPPLEMENT    . PATADAY 0.2 % SOLN Place 1 drop into both eyes 2 (two) times daily.     Marland Kitchen senna-docusate (SENOKOT-S) 8.6-50 MG tablet Take 1 tablet by mouth 2 (two) times daily.    . sertraline (ZOLOFT) 50 MG tablet Take 1 tablet by mouth daily with breakfast.      No current facility-administered medications for this visit.     Allergies:   Patient has no known allergies.   Social History:  The patient  reports that she quit smoking about 14 years ago. Her smoking use included cigarettes. She has a 27.50 pack-year smoking history. She has never used smokeless tobacco. She reports that she does not drink alcohol or use drugs.   Family History:  The patient's family history includes Arthritis in her mother; CAD in her sister; Cancer in her son; Diabetes in her mother; Heart disease in her father; Heart failure in her father; Hypertension in her sister; Lung cancer in her sister; Ovarian cancer in her sister.  ROS:  Please see the history of present illness.  All other systems are reviewed and otherwise negative.   PHYSICAL EXAM:  VS:  BP 138/88   Pulse 76   Ht 4' 11.75" (1.518 m)   Wt 80 lb (36.3 kg)   LMP  (LMP Unknown)   BMI 15.75 kg/m  BMI: Body mass index is 15.75 kg/m. Very frail, thin body habitus, in no acute distress  HEENT: normocephalic, atraumatic  Neck: no JVD, carotid bruits or masses Cardiac:  irreg-irreg; 1/t6 SM, no rubs, or gallops Lungs:  CTA b/l, no wheezing, rhonchi or rales  Abd: soft, nontender,  + BS MS: no deformity, age appropriate/perhaps advanced atrophy Ext:  no  edema  Skin: warm and dry, no rash Neuro:  No gross deficits appreciated Psych: euthymic mood, full affect   PPM site is stable, no tethering or discomfort   EKG:  Done today and reviewed by myself AFib, V paced and sensed beats PPM interrogation done today and reviewed by myself; battery and lead measurements are good, no reprogramming, AFib 40's today   01/18/07: TTE SUMMARY - Overall left ventricular systolic function was normal. Left    ventricular ejection fraction was estimated to be 65 %. There    were no left ventricular regional wall motion abnormalities.    Left ventricular diastolic function parameters were normal. -  Aortic valve thickness was mildly increased. There was trivial    aortic valvular regurgitation.  Recent Labs: No results found for requested labs within last 8760 hours.  No results found for requested labs within last 8760 hours.   CrCl cannot be calculated (Patient's most recent lab result is older than the maximum 21 days allowed.).   Wt Readings from Last 3 Encounters:  03/26/18 80 lb (36.3 kg)  02/17/17 93 lb 3.2 oz (42.3 kg)  01/29/17 92 lb 12.8 oz (42.1 kg)     Other studies reviewed: Additional studies/records reviewed today include: summarized above  ASSESSMENT AND PLAN:  1. PPM     Intact function, no changes made  2. HTN     Recheck is 138/88, monitors at home, usually better then today with occassional higher numbers     No changes today  3. Permanent AFib     CHA2DS2Vasc is 4, on Eliquis     appropriately dosedat 2.5mg  BID     BMET and CBC today   Disposition: F/u with remote pacer check in 3 mo, in-clinis with EP in 1 year, sooner if needed.  Current medicines are reviewed at length with the patient today.  The patient did not have any concerns regarding medicines.  Venetia Night, PA-C 03/26/2018 3:04 PM     [place] [address] [place] [address] [phone]  (office)  [phone] (fax)

## 2018-03-26 ENCOUNTER — Ambulatory Visit (INDEPENDENT_AMBULATORY_CARE_PROVIDER_SITE_OTHER): Payer: Medicare Other | Admitting: Physician Assistant

## 2018-03-26 VITALS — BP 138/88 | HR 76 | Ht 59.75 in | Wt 80.0 lb

## 2018-03-26 DIAGNOSIS — I4891 Unspecified atrial fibrillation: Secondary | ICD-10-CM | POA: Diagnosis not present

## 2018-03-26 DIAGNOSIS — Z950 Presence of cardiac pacemaker: Secondary | ICD-10-CM | POA: Diagnosis not present

## 2018-03-26 DIAGNOSIS — I4821 Permanent atrial fibrillation: Secondary | ICD-10-CM

## 2018-03-26 DIAGNOSIS — I10 Essential (primary) hypertension: Secondary | ICD-10-CM

## 2018-03-26 DIAGNOSIS — I482 Chronic atrial fibrillation: Secondary | ICD-10-CM

## 2018-03-26 NOTE — Patient Instructions (Addendum)
Medication Instructions:   Your physician recommends that you continue on your current medications as directed. Please refer to the Current Medication list given to you today.    If you need a refill on your cardiac medications before your next appointment, please call your pharmacy.  Labwork: BMET CBC TODAY    Testing/Procedures: NONE ORDERED  TODAY    Follow-Up:  FIRST AVAILABLE WITH DR Marlou Porch   Your physician wants you to follow-up in: [place] will receive a reminder letter in the mail two months in advance. If you don't receive a letter, please call our office to schedule the follow-up appointment.    Remote monitoring is used to monitor your Pacemaker of ICD from home. This monitoring reduces the number of office visits required to check your device to one time per year. It allows [place] to keep an eye on the functioning of your device to ensure it is working properly. You are scheduled for a device check from home on . 06-25-18  You may send your transmission at any time that day. If you have a wireless device, the transmission will be sent automatically. After your physician reviews your transmission, you will receive a postcard with your next transmission date.          Any Other Special Instructions Will Be Listed Below (If Applicable).

## 2018-03-27 LAB — BASIC METABOLIC PANEL
BUN / CREAT RATIO: 29 — AB (ref 12–28)
BUN: 31 mg/dL — AB (ref 8–27)
CO2: 27 mmol/L (ref 20–29)
Calcium: 9.8 mg/dL (ref 8.7–10.3)
Chloride: 98 mmol/L (ref 96–106)
Creatinine, Ser: 1.08 mg/dL — ABNORMAL HIGH (ref 0.57–1.00)
GFR calc Af Amer: 53 mL/min/{1.73_m2} — ABNORMAL LOW (ref >59)
GFR calc non Af Amer: 46 mL/min/{1.73_m2} — ABNORMAL LOW (ref >59)
GLUCOSE: 80 mg/dL (ref 65–99)
Potassium: 4.3 mmol/L (ref 3.5–5.2)
Sodium: 141 mmol/L (ref 134–144)

## 2018-03-27 LAB — CBC
Hematocrit: 43.9 % (ref 34.0–46.6)
Hemoglobin: 13.6 g/dL (ref 11.1–15.9)
MCH: 26.5 pg — ABNORMAL LOW (ref 26.6–33.0)
MCHC: 31 g/dL — ABNORMAL LOW (ref 31.5–35.7)
MCV: 86 fL (ref 79–97)
Platelets: 312 10*3/uL (ref 150–450)
RBC: 5.13 x10E6/uL (ref 3.77–5.28)
RDW: 17 % — ABNORMAL HIGH (ref 12.3–15.4)
WBC: 8.2 10*3/uL (ref 3.4–10.8)

## 2018-04-09 ENCOUNTER — Other Ambulatory Visit: Payer: Self-pay | Admitting: Cardiology

## 2018-04-27 ENCOUNTER — Encounter: Payer: Self-pay | Admitting: Cardiology

## 2018-04-27 ENCOUNTER — Ambulatory Visit (INDEPENDENT_AMBULATORY_CARE_PROVIDER_SITE_OTHER): Payer: Medicare Other | Admitting: Cardiology

## 2018-04-27 VITALS — BP 122/78 | HR 66 | Ht 59.5 in | Wt 79.8 lb

## 2018-04-27 DIAGNOSIS — I482 Chronic atrial fibrillation: Secondary | ICD-10-CM | POA: Diagnosis not present

## 2018-04-27 DIAGNOSIS — E43 Unspecified severe protein-calorie malnutrition: Secondary | ICD-10-CM | POA: Diagnosis not present

## 2018-04-27 DIAGNOSIS — Z950 Presence of cardiac pacemaker: Secondary | ICD-10-CM

## 2018-04-27 DIAGNOSIS — I4821 Permanent atrial fibrillation: Secondary | ICD-10-CM

## 2018-04-27 NOTE — Patient Instructions (Signed)
Medication Instructions:  The current medical regimen is effective;  continue present plan and medications.  Follow-Up: Follow up in 6 months with Truitt Merle, NP.  You will receive a letter in the mail 2 months before you are due.  Please call us when you receive this letter to schedule your follow up appointment.  Follow up in 1 year with Dr. Marlou Porch.  You will receive a letter in the mail 2 months before you are due.  Please call us when you receive this letter to schedule your follow up appointment.  Any Other Special Instructions Will Be Listed Below (If Applicable).   Thank you for choosing [facility]!!      If you need a refill on your cardiac medications before your next appointment, please call your pharmacy.

## 2018-04-27 NOTE — Progress Notes (Signed)
[facility]. [address] [place], [place]  [address] Phone: [phone] Fax:  [phone]  Date:  04/27/2018   ID:  [name], DOB [birth year], MRN [id number]  PCP:  Lavone Orn, MD  Electrophysiologist: Dr. Rayann Heman  History of Present Illness: [name] is a 84 y.o. female with permanent atrial fibrillation, previously  on amiodarone, pacemaker for sick sinus syndrome here for followup after being in emergency department for hypertension. Pacemaker check demonstrated 100% atrial fibrillation.  Her daughter took her to the emergency room on 02/11/17 after she took her blood pressure and it was in the 200 range. She was nervous, had some chest discomfort. Troponins were normal. CT scan was normal.  I previously discontinued her diltiazem to hopefully help simple bladder medication regimen however this likely contributed to her hypertension. She is now back on diltiazem 240 mg one today.  She takes Lasix 3 times a week.  Does not have much of an appetite, no taste.  In general frustrated with the way that she feels overall, she is DO NOT RESUSCITATE. She is in a sense waiting for death she states. Denies chest pain, no significant shortness of breath currently  04/27/2018- she was in the emergency department on 02/11/2017 with chest pain reviewed as above.  Wt Readings from Last 3 Encounters:  04/27/18 79 lb 12.8 oz (36.2 kg)  03/26/18 80 lb (36.3 kg)  02/17/17 93 lb 3.2 oz (42.3 kg)     Past Medical History:  Diagnosis Date  . Adrenal adenoma    Neg endocrine workup  . Anxiety   . Bronchiectasis    bronch 10/2009 w/ stenotrophomonas sens to levaquin and bactrim  . Chronic headaches   . Colonic diverticular abscess 01/26/2013  . Diverticulitis 01/2013   Severe. Was hospitalized for this and she developed 100% a fib.  . Dry eyes   . Encounter for long-term (current) use of other medications   . Esophageal reflux   . Glaucoma   . Glaucoma   . H/O  hematuria    Neg workup Dahlstedt  . History of diverticular abscess 2014  . Hyperlipidemia   . Hypertension   . Hyperthyroidism   . Lung cancer ([place]) 2007   hx stage 1 BAC/ RML. s/p wedge resection from RML, superior segment RLL resection s/p seed implants  . Multinodular goiter (nontoxic)   . Mycobacterial disease   . Osteoporosis   . Permanent atrial fibrillation ([place])   . Sciatica   . SSS (sick sinus syndrome) ([place])   . Tachycardia-bradycardia syndrome ([place]) 2008   sp PPM by Dr Leonia Reeves  . Thyrotoxicosis without mention of goiter or other cause, without mention of thyrotoxic crisis or storm     Past Surgical History:  Procedure Laterality Date  . [place]  . CATARACT EXTRACTION, BILATERAL  04/2004 & 07/2004  . HERNIA REPAIR  1988  . LAMINECTOMY  11/1986  . PACEMAKER INSERTION  01/2007   (MDT) Pacemaker syndrome 04/2009, new PM 05/2012 - [facility]  . PERMANENT PACEMAKER GENERATOR CHANGE N/A 05/28/2012   Procedure: PERMANENT PACEMAKER GENERATOR CHANGE;  Surgeon: Thompson Grayer, MD;  Location: [facility] CATH LAB;  Service: Cardiovascular;  Laterality: N/A;  . WEDGE RESECTION  03/2006   For lung cancer. Right middle lobe and right lower lobe superior segment with seed implants, [place].    Current Outpatient Medications  Medication Sig Dispense Refill  . albuterol (PROVENTIL HFA;VENTOLIN HFA) 108 (90 Base) MCG/ACT inhaler Inhale  1-2 puffs into the lungs every 6 (six) hours as needed for wheezing or shortness of breath.    Marland Kitchen apixaban (ELIQUIS) 2.5 MG TABS tablet Take 2.5 mg by mouth 2 (two) times daily.    . cycloSPORINE (RESTASIS) 0.05 % ophthalmic emulsion Place 1 drop into both eyes 2 (two) times daily.    Marland Kitchen DILT-XR 240 MG 24 hr capsule Take 240 mg by mouth daily.  11  . dorzolamide (TRUSOPT) 2 % ophthalmic solution Place 1 drop into both eyes 3 (three) times daily.    Marland Kitchen HYDROcodone-acetaminophen (NORCO) 7.5-325 MG tablet Take 1 tablet by mouth every 6 (six) hours.  0  . latanoprost  (XALATAN) 0.005 % ophthalmic solution Place 1 drop into both eyes at bedtime.     . metoprolol tartrate (LOPRESSOR) 50 MG tablet TAKE 1 TABLET TWICE A DAY 180 tablet 2  . Multiple Vitamin (MULTIVITAMIN WITH MINERALS) TABS tablet Take 1 tablet by mouth every Monday, Wednesday, and Friday.    Marland Kitchen PATADAY 0.2 % SOLN Place 1 drop into both eyes 2 (two) times daily.     Marland Kitchen senna-docusate (SENOKOT-S) 8.6-50 MG tablet Take 1 tablet by mouth 2 (two) times daily.    . sertraline (ZOLOFT) 50 MG tablet Take 1 tablet by mouth daily with breakfast.      No current facility-administered medications for this visit.     Allergies:   No Known Allergies  Social History:  The patient  reports that she quit smoking about 14 years ago. Her smoking use included cigarettes. She has a 27.50 pack-year smoking history. She has never used smokeless tobacco. She reports that she does not drink alcohol or use drugs.   ROS:  Positive for cough, chronic, mucus production, no syncope  PHYSICAL EXAM: VS:  BP 122/78   Pulse 66   Ht 4' 11.5" (1.511 m)   Wt 79 lb 12.8 oz (36.2 kg)   LMP  (LMP Unknown)   SpO2 96%   BMI 15.85 kg/m   GEN: Thin, pleasant  HEENT: normal  Neck: no JVD, carotid bruits, or masses Cardiac: Regular rate and rhythm; no murmurs, rubs, or gallops,no edema  Respiratory:  Rhonchorous breath sounds bilaterally GI: soft, nontender, nondistended, + BS MS: no deformity or atrophy  Skin: warm and dry, no rash Neuro:  Alert and Oriented x 3, Strength and sensation are intact Psych: euthymic mood, full affect     EKG: EKG 01/29/17-63 atrial fibrillation ventricular pacing personally viewed-02/08/16-demand pacemaker 76 atrial fibrillation underlying, right bundle branch block. 01/30/15-ventricular pacing noted, underlying the atrial fibrillation, occasional PVCs- Ventricular pacing. Permanent atrial fibrillation. Heart rate 76.     Creatinine normal  ASSESSMENT AND PLAN:  1. Permanent atrial  fibrillation-off of amiodarone. Rate controlled. We previously tried to stop her diltiazem however her blood pressure increased significantly. She is now back on. Continue metoprolol 50 mg twice a day. She is on diltiazem 240 once a day.  No changes made. 2. Pacemaker-functioning well.  We reviewed pacemaker together at previous visit.  Seems to be doing well.  Intact.  Skin is very thin.  There is no evidence of erosion. 3. Hypertension-well-controlled on current medications. 4. Chronic anticoagulation-  Eliquis 2.5 mg twice a day. (Age, weight).  No changes.  Stable. 5. Protein calorie malnutrition -continue to encourage nutrition. She is not very enthusiastic given her lack of taste.  Has trouble getting food down.  Mostly a liquid diet.  Carnation instant breakfast.  Boost.  She even has  trouble getting toes down.  She says she feels best when she is asleep.  Low energy.  Decreased will to live. 6. Chronic cough-bronchitis, mucus production.  Challenging for her. 7. 6 month followup, APP, 12 me.   Signed, Candee Furbish, MD [facility]  04/27/2018 12:08 PM

## 2018-05-28 DIAGNOSIS — G8929 Other chronic pain: Secondary | ICD-10-CM | POA: Diagnosis not present

## 2018-05-28 DIAGNOSIS — E43 Unspecified severe protein-calorie malnutrition: Secondary | ICD-10-CM | POA: Diagnosis not present

## 2018-05-28 DIAGNOSIS — I10 Essential (primary) hypertension: Secondary | ICD-10-CM | POA: Diagnosis not present

## 2018-05-28 DIAGNOSIS — Z23 Encounter for immunization: Secondary | ICD-10-CM | POA: Diagnosis not present

## 2018-05-28 DIAGNOSIS — I480 Paroxysmal atrial fibrillation: Secondary | ICD-10-CM | POA: Diagnosis not present

## 2018-06-12 DIAGNOSIS — I5040 Unspecified combined systolic (congestive) and diastolic (congestive) heart failure: Secondary | ICD-10-CM | POA: Diagnosis not present

## 2018-06-12 DIAGNOSIS — I2510 Atherosclerotic heart disease of native coronary artery without angina pectoris: Secondary | ICD-10-CM | POA: Diagnosis not present

## 2018-06-12 DIAGNOSIS — H1013 Acute atopic conjunctivitis, bilateral: Secondary | ICD-10-CM | POA: Diagnosis not present

## 2018-06-12 DIAGNOSIS — C3490 Malignant neoplasm of unspecified part of unspecified bronchus or lung: Secondary | ICD-10-CM | POA: Diagnosis not present

## 2018-06-12 DIAGNOSIS — H401132 Primary open-angle glaucoma, bilateral, moderate stage: Secondary | ICD-10-CM | POA: Diagnosis not present

## 2018-06-12 DIAGNOSIS — H04123 Dry eye syndrome of bilateral lacrimal glands: Secondary | ICD-10-CM | POA: Diagnosis not present

## 2018-06-12 DIAGNOSIS — I10 Essential (primary) hypertension: Secondary | ICD-10-CM | POA: Diagnosis not present

## 2018-06-12 DIAGNOSIS — N183 Chronic kidney disease, stage 3 (moderate): Secondary | ICD-10-CM | POA: Diagnosis not present

## 2018-06-12 DIAGNOSIS — R630 Anorexia: Secondary | ICD-10-CM | POA: Diagnosis not present

## 2018-06-12 DIAGNOSIS — H409 Unspecified glaucoma: Secondary | ICD-10-CM | POA: Diagnosis not present

## 2018-06-12 DIAGNOSIS — K219 Gastro-esophageal reflux disease without esophagitis: Secondary | ICD-10-CM | POA: Diagnosis not present

## 2018-06-12 DIAGNOSIS — M3501 Sicca syndrome with keratoconjunctivitis: Secondary | ICD-10-CM | POA: Diagnosis not present

## 2018-06-12 DIAGNOSIS — E785 Hyperlipidemia, unspecified: Secondary | ICD-10-CM | POA: Diagnosis not present

## 2018-06-12 DIAGNOSIS — R52 Pain, unspecified: Secondary | ICD-10-CM | POA: Diagnosis not present

## 2018-06-12 DIAGNOSIS — I4891 Unspecified atrial fibrillation: Secondary | ICD-10-CM | POA: Diagnosis not present

## 2018-06-12 DIAGNOSIS — E46 Unspecified protein-calorie malnutrition: Secondary | ICD-10-CM | POA: Diagnosis not present

## 2018-06-15 DIAGNOSIS — I5040 Unspecified combined systolic (congestive) and diastolic (congestive) heart failure: Secondary | ICD-10-CM | POA: Diagnosis not present

## 2018-06-15 DIAGNOSIS — R630 Anorexia: Secondary | ICD-10-CM | POA: Diagnosis not present

## 2018-06-15 DIAGNOSIS — I10 Essential (primary) hypertension: Secondary | ICD-10-CM | POA: Diagnosis not present

## 2018-06-15 DIAGNOSIS — E46 Unspecified protein-calorie malnutrition: Secondary | ICD-10-CM | POA: Diagnosis not present

## 2018-06-15 DIAGNOSIS — I2510 Atherosclerotic heart disease of native coronary artery without angina pectoris: Secondary | ICD-10-CM | POA: Diagnosis not present

## 2018-06-15 DIAGNOSIS — I4891 Unspecified atrial fibrillation: Secondary | ICD-10-CM | POA: Diagnosis not present

## 2018-06-16 DIAGNOSIS — E785 Hyperlipidemia, unspecified: Secondary | ICD-10-CM | POA: Diagnosis not present

## 2018-06-16 DIAGNOSIS — N183 Chronic kidney disease, stage 3 (moderate): Secondary | ICD-10-CM | POA: Diagnosis not present

## 2018-06-16 DIAGNOSIS — C3490 Malignant neoplasm of unspecified part of unspecified bronchus or lung: Secondary | ICD-10-CM | POA: Diagnosis not present

## 2018-06-16 DIAGNOSIS — K219 Gastro-esophageal reflux disease without esophagitis: Secondary | ICD-10-CM | POA: Diagnosis not present

## 2018-06-16 DIAGNOSIS — I10 Essential (primary) hypertension: Secondary | ICD-10-CM | POA: Diagnosis not present

## 2018-06-16 DIAGNOSIS — R630 Anorexia: Secondary | ICD-10-CM | POA: Diagnosis not present

## 2018-06-16 DIAGNOSIS — I4891 Unspecified atrial fibrillation: Secondary | ICD-10-CM | POA: Diagnosis not present

## 2018-06-16 DIAGNOSIS — R52 Pain, unspecified: Secondary | ICD-10-CM | POA: Diagnosis not present

## 2018-06-16 DIAGNOSIS — I5040 Unspecified combined systolic (congestive) and diastolic (congestive) heart failure: Secondary | ICD-10-CM | POA: Diagnosis not present

## 2018-06-16 DIAGNOSIS — I2510 Atherosclerotic heart disease of native coronary artery without angina pectoris: Secondary | ICD-10-CM | POA: Diagnosis not present

## 2018-06-16 DIAGNOSIS — H409 Unspecified glaucoma: Secondary | ICD-10-CM | POA: Diagnosis not present

## 2018-06-16 DIAGNOSIS — E46 Unspecified protein-calorie malnutrition: Secondary | ICD-10-CM | POA: Diagnosis not present

## 2018-06-19 DIAGNOSIS — I10 Essential (primary) hypertension: Secondary | ICD-10-CM | POA: Diagnosis not present

## 2018-06-19 DIAGNOSIS — R630 Anorexia: Secondary | ICD-10-CM | POA: Diagnosis not present

## 2018-06-19 DIAGNOSIS — I5040 Unspecified combined systolic (congestive) and diastolic (congestive) heart failure: Secondary | ICD-10-CM | POA: Diagnosis not present

## 2018-06-19 DIAGNOSIS — I2510 Atherosclerotic heart disease of native coronary artery without angina pectoris: Secondary | ICD-10-CM | POA: Diagnosis not present

## 2018-06-19 DIAGNOSIS — I4891 Unspecified atrial fibrillation: Secondary | ICD-10-CM | POA: Diagnosis not present

## 2018-06-19 DIAGNOSIS — E46 Unspecified protein-calorie malnutrition: Secondary | ICD-10-CM | POA: Diagnosis not present

## 2018-06-24 DIAGNOSIS — E46 Unspecified protein-calorie malnutrition: Secondary | ICD-10-CM | POA: Diagnosis not present

## 2018-06-24 DIAGNOSIS — R630 Anorexia: Secondary | ICD-10-CM | POA: Diagnosis not present

## 2018-06-24 DIAGNOSIS — I2510 Atherosclerotic heart disease of native coronary artery without angina pectoris: Secondary | ICD-10-CM | POA: Diagnosis not present

## 2018-06-24 DIAGNOSIS — I4891 Unspecified atrial fibrillation: Secondary | ICD-10-CM | POA: Diagnosis not present

## 2018-06-24 DIAGNOSIS — I10 Essential (primary) hypertension: Secondary | ICD-10-CM | POA: Diagnosis not present

## 2018-06-24 DIAGNOSIS — I5040 Unspecified combined systolic (congestive) and diastolic (congestive) heart failure: Secondary | ICD-10-CM | POA: Diagnosis not present

## 2018-06-25 ENCOUNTER — Encounter: Admitting: *Deleted

## 2018-06-25 ENCOUNTER — Telehealth: Payer: Self-pay

## 2018-06-25 NOTE — Telephone Encounter (Signed)
LMOVM reminding pt to send remote transmission.   

## 2018-06-26 DIAGNOSIS — R630 Anorexia: Secondary | ICD-10-CM | POA: Diagnosis not present

## 2018-06-26 DIAGNOSIS — I2510 Atherosclerotic heart disease of native coronary artery without angina pectoris: Secondary | ICD-10-CM | POA: Diagnosis not present

## 2018-06-26 DIAGNOSIS — I10 Essential (primary) hypertension: Secondary | ICD-10-CM | POA: Diagnosis not present

## 2018-06-26 DIAGNOSIS — E46 Unspecified protein-calorie malnutrition: Secondary | ICD-10-CM | POA: Diagnosis not present

## 2018-06-26 DIAGNOSIS — I4891 Unspecified atrial fibrillation: Secondary | ICD-10-CM | POA: Diagnosis not present

## 2018-06-26 DIAGNOSIS — I5040 Unspecified combined systolic (congestive) and diastolic (congestive) heart failure: Secondary | ICD-10-CM | POA: Diagnosis not present

## 2018-06-29 DIAGNOSIS — I5040 Unspecified combined systolic (congestive) and diastolic (congestive) heart failure: Secondary | ICD-10-CM | POA: Diagnosis not present

## 2018-06-29 DIAGNOSIS — E46 Unspecified protein-calorie malnutrition: Secondary | ICD-10-CM | POA: Diagnosis not present

## 2018-06-29 DIAGNOSIS — I4891 Unspecified atrial fibrillation: Secondary | ICD-10-CM | POA: Diagnosis not present

## 2018-06-29 DIAGNOSIS — I10 Essential (primary) hypertension: Secondary | ICD-10-CM | POA: Diagnosis not present

## 2018-06-29 DIAGNOSIS — R630 Anorexia: Secondary | ICD-10-CM | POA: Diagnosis not present

## 2018-06-29 DIAGNOSIS — I2510 Atherosclerotic heart disease of native coronary artery without angina pectoris: Secondary | ICD-10-CM | POA: Diagnosis not present

## 2018-07-02 ENCOUNTER — Encounter: Payer: Self-pay | Admitting: Cardiology

## 2018-07-02 DIAGNOSIS — I10 Essential (primary) hypertension: Secondary | ICD-10-CM | POA: Diagnosis not present

## 2018-07-02 DIAGNOSIS — R630 Anorexia: Secondary | ICD-10-CM | POA: Diagnosis not present

## 2018-07-02 DIAGNOSIS — I2510 Atherosclerotic heart disease of native coronary artery without angina pectoris: Secondary | ICD-10-CM | POA: Diagnosis not present

## 2018-07-02 DIAGNOSIS — E46 Unspecified protein-calorie malnutrition: Secondary | ICD-10-CM | POA: Diagnosis not present

## 2018-07-02 DIAGNOSIS — I4891 Unspecified atrial fibrillation: Secondary | ICD-10-CM | POA: Diagnosis not present

## 2018-07-02 DIAGNOSIS — I5040 Unspecified combined systolic (congestive) and diastolic (congestive) heart failure: Secondary | ICD-10-CM | POA: Diagnosis not present

## 2018-07-04 DIAGNOSIS — I2510 Atherosclerotic heart disease of native coronary artery without angina pectoris: Secondary | ICD-10-CM | POA: Diagnosis not present

## 2018-07-04 DIAGNOSIS — I10 Essential (primary) hypertension: Secondary | ICD-10-CM | POA: Diagnosis not present

## 2018-07-04 DIAGNOSIS — E46 Unspecified protein-calorie malnutrition: Secondary | ICD-10-CM | POA: Diagnosis not present

## 2018-07-04 DIAGNOSIS — I4891 Unspecified atrial fibrillation: Secondary | ICD-10-CM | POA: Diagnosis not present

## 2018-07-04 DIAGNOSIS — I5040 Unspecified combined systolic (congestive) and diastolic (congestive) heart failure: Secondary | ICD-10-CM | POA: Diagnosis not present

## 2018-07-04 DIAGNOSIS — R630 Anorexia: Secondary | ICD-10-CM | POA: Diagnosis not present

## 2018-07-05 DIAGNOSIS — I4891 Unspecified atrial fibrillation: Secondary | ICD-10-CM | POA: Diagnosis not present

## 2018-07-05 DIAGNOSIS — I5040 Unspecified combined systolic (congestive) and diastolic (congestive) heart failure: Secondary | ICD-10-CM | POA: Diagnosis not present

## 2018-07-05 DIAGNOSIS — E46 Unspecified protein-calorie malnutrition: Secondary | ICD-10-CM | POA: Diagnosis not present

## 2018-07-05 DIAGNOSIS — R630 Anorexia: Secondary | ICD-10-CM | POA: Diagnosis not present

## 2018-07-05 DIAGNOSIS — I10 Essential (primary) hypertension: Secondary | ICD-10-CM | POA: Diagnosis not present

## 2018-07-05 DIAGNOSIS — I2510 Atherosclerotic heart disease of native coronary artery without angina pectoris: Secondary | ICD-10-CM | POA: Diagnosis not present

## 2018-07-17 DEATH — deceased
